# Patient Record
Sex: Female | Born: 1950 | Race: White | Hispanic: No | Marital: Married | Smoking: Current every day smoker
Health system: Southern US, Community
[De-identification: ages and names within clinical notes are randomized; demographics above are authoritative.]

## PROBLEM LIST (undated history)

## (undated) ENCOUNTER — Emergency Department (HOSPITAL_BASED_OUTPATIENT_CLINIC_OR_DEPARTMENT_OTHER): Payer: Medicare Other

## (undated) DIAGNOSIS — R51 Headache: Secondary | ICD-10-CM

## (undated) DIAGNOSIS — Z9889 Other specified postprocedural states: Secondary | ICD-10-CM

## (undated) DIAGNOSIS — I10 Essential (primary) hypertension: Secondary | ICD-10-CM

## (undated) DIAGNOSIS — R112 Nausea with vomiting, unspecified: Secondary | ICD-10-CM

## (undated) DIAGNOSIS — N390 Urinary tract infection, site not specified: Secondary | ICD-10-CM

## (undated) DIAGNOSIS — R519 Headache, unspecified: Secondary | ICD-10-CM

## (undated) DIAGNOSIS — M549 Dorsalgia, unspecified: Secondary | ICD-10-CM

## (undated) DIAGNOSIS — F419 Anxiety disorder, unspecified: Secondary | ICD-10-CM

## (undated) DIAGNOSIS — F329 Major depressive disorder, single episode, unspecified: Secondary | ICD-10-CM

## (undated) DIAGNOSIS — M797 Fibromyalgia: Secondary | ICD-10-CM

## (undated) DIAGNOSIS — I639 Cerebral infarction, unspecified: Secondary | ICD-10-CM

## (undated) DIAGNOSIS — M1990 Unspecified osteoarthritis, unspecified site: Secondary | ICD-10-CM

## (undated) DIAGNOSIS — J449 Chronic obstructive pulmonary disease, unspecified: Secondary | ICD-10-CM

## (undated) DIAGNOSIS — G4730 Sleep apnea, unspecified: Secondary | ICD-10-CM

## (undated) DIAGNOSIS — F32A Depression, unspecified: Secondary | ICD-10-CM

## (undated) DIAGNOSIS — R0600 Dyspnea, unspecified: Secondary | ICD-10-CM

## (undated) HISTORY — PX: HAND SURGERY: SHX662

## (undated) HISTORY — PX: CHOLECYSTECTOMY: SHX55

## (undated) HISTORY — PX: ABDOMINAL HYSTERECTOMY: SHX81

---

## 2000-11-16 ENCOUNTER — Ambulatory Visit (HOSPITAL_COMMUNITY): Admission: RE | Admit: 2000-11-16 | Discharge: 2000-11-16 | Payer: Self-pay | Admitting: Family Medicine

## 2000-11-16 ENCOUNTER — Encounter: Payer: Self-pay | Admitting: Family Medicine

## 2000-12-17 ENCOUNTER — Encounter: Payer: Self-pay | Admitting: Family Medicine

## 2000-12-17 ENCOUNTER — Ambulatory Visit (HOSPITAL_COMMUNITY): Admission: RE | Admit: 2000-12-17 | Discharge: 2000-12-17 | Payer: Self-pay | Admitting: Family Medicine

## 2001-04-19 ENCOUNTER — Encounter: Payer: Self-pay | Admitting: Family Medicine

## 2001-04-19 ENCOUNTER — Ambulatory Visit (HOSPITAL_COMMUNITY): Admission: RE | Admit: 2001-04-19 | Discharge: 2001-04-19 | Payer: Self-pay | Admitting: Family Medicine

## 2001-04-20 ENCOUNTER — Encounter: Payer: Self-pay | Admitting: Family Medicine

## 2001-04-20 ENCOUNTER — Ambulatory Visit (HOSPITAL_COMMUNITY): Admission: RE | Admit: 2001-04-20 | Discharge: 2001-04-20 | Payer: Self-pay | Admitting: Family Medicine

## 2002-09-08 ENCOUNTER — Ambulatory Visit (HOSPITAL_COMMUNITY): Admission: RE | Admit: 2002-09-08 | Discharge: 2002-09-08 | Payer: Self-pay | Admitting: Family Medicine

## 2002-09-08 ENCOUNTER — Encounter: Payer: Self-pay | Admitting: Family Medicine

## 2003-05-22 ENCOUNTER — Encounter: Payer: Self-pay | Admitting: Family Medicine

## 2003-05-22 ENCOUNTER — Ambulatory Visit (HOSPITAL_COMMUNITY): Admission: RE | Admit: 2003-05-22 | Discharge: 2003-05-22 | Payer: Self-pay | Admitting: Family Medicine

## 2003-07-04 ENCOUNTER — Ambulatory Visit (HOSPITAL_COMMUNITY): Admission: RE | Admit: 2003-07-04 | Discharge: 2003-07-04 | Payer: Self-pay | Admitting: Family Medicine

## 2003-08-25 ENCOUNTER — Ambulatory Visit (HOSPITAL_COMMUNITY): Admission: RE | Admit: 2003-08-25 | Discharge: 2003-08-25 | Payer: Self-pay | Admitting: Urology

## 2004-04-10 ENCOUNTER — Ambulatory Visit (HOSPITAL_COMMUNITY): Admission: RE | Admit: 2004-04-10 | Discharge: 2004-04-10 | Payer: Self-pay | Admitting: Orthopedic Surgery

## 2004-05-15 ENCOUNTER — Encounter: Admission: RE | Admit: 2004-05-15 | Discharge: 2004-05-15 | Payer: Self-pay | Admitting: Orthopedic Surgery

## 2004-05-29 ENCOUNTER — Encounter: Admission: RE | Admit: 2004-05-29 | Discharge: 2004-05-29 | Payer: Self-pay | Admitting: Orthopedic Surgery

## 2004-06-12 ENCOUNTER — Encounter: Admission: RE | Admit: 2004-06-12 | Discharge: 2004-06-12 | Payer: Self-pay | Admitting: Orthopedic Surgery

## 2004-08-16 ENCOUNTER — Ambulatory Visit (HOSPITAL_COMMUNITY): Admission: RE | Admit: 2004-08-16 | Discharge: 2004-08-16 | Payer: Self-pay | Admitting: Family Medicine

## 2004-08-22 ENCOUNTER — Ambulatory Visit: Payer: Self-pay | Admitting: Orthopedic Surgery

## 2004-10-21 ENCOUNTER — Ambulatory Visit (HOSPITAL_COMMUNITY): Admission: RE | Admit: 2004-10-21 | Discharge: 2004-10-21 | Payer: Self-pay | Admitting: Neurosurgery

## 2004-11-13 ENCOUNTER — Ambulatory Visit (HOSPITAL_COMMUNITY): Admission: RE | Admit: 2004-11-13 | Discharge: 2004-11-13 | Payer: Self-pay | Admitting: Neurosurgery

## 2007-04-28 ENCOUNTER — Ambulatory Visit (HOSPITAL_COMMUNITY): Admission: RE | Admit: 2007-04-28 | Discharge: 2007-04-28 | Payer: Self-pay | Admitting: Family Medicine

## 2007-05-06 ENCOUNTER — Ambulatory Visit (HOSPITAL_COMMUNITY): Admission: RE | Admit: 2007-05-06 | Discharge: 2007-05-06 | Payer: Self-pay | Admitting: Family Medicine

## 2007-05-12 ENCOUNTER — Ambulatory Visit: Payer: Self-pay | Admitting: Internal Medicine

## 2007-05-18 ENCOUNTER — Emergency Department (HOSPITAL_COMMUNITY): Admission: EM | Admit: 2007-05-18 | Discharge: 2007-05-18 | Payer: Self-pay | Admitting: Emergency Medicine

## 2007-05-18 ENCOUNTER — Ambulatory Visit: Payer: Self-pay

## 2007-06-02 ENCOUNTER — Ambulatory Visit: Payer: Self-pay

## 2007-06-09 ENCOUNTER — Ambulatory Visit: Payer: Self-pay | Admitting: Internal Medicine

## 2007-06-10 ENCOUNTER — Ambulatory Visit: Payer: Self-pay | Admitting: Internal Medicine

## 2007-06-21 ENCOUNTER — Telehealth (INDEPENDENT_AMBULATORY_CARE_PROVIDER_SITE_OTHER): Payer: Self-pay | Admitting: *Deleted

## 2007-06-25 DIAGNOSIS — I10 Essential (primary) hypertension: Secondary | ICD-10-CM | POA: Insufficient documentation

## 2007-08-09 ENCOUNTER — Ambulatory Visit: Payer: Self-pay | Admitting: Internal Medicine

## 2007-08-09 DIAGNOSIS — J449 Chronic obstructive pulmonary disease, unspecified: Secondary | ICD-10-CM | POA: Insufficient documentation

## 2007-08-09 DIAGNOSIS — Z720 Tobacco use: Secondary | ICD-10-CM | POA: Insufficient documentation

## 2007-08-09 DIAGNOSIS — F17200 Nicotine dependence, unspecified, uncomplicated: Secondary | ICD-10-CM | POA: Insufficient documentation

## 2007-08-09 DIAGNOSIS — J4489 Other specified chronic obstructive pulmonary disease: Secondary | ICD-10-CM | POA: Insufficient documentation

## 2007-09-06 ENCOUNTER — Ambulatory Visit: Payer: Self-pay | Admitting: Internal Medicine

## 2007-11-01 ENCOUNTER — Ambulatory Visit: Payer: Self-pay | Admitting: Internal Medicine

## 2007-11-01 LAB — CONVERTED CEMR LAB
BUN: 9 mg/dL (ref 6–23)
Calcium: 9.4 mg/dL (ref 8.4–10.5)
Creatinine, Ser: 1 mg/dL (ref 0.4–1.2)
GFR calc Af Amer: 73 mL/min
GFR calc non Af Amer: 61 mL/min

## 2007-11-10 ENCOUNTER — Ambulatory Visit: Payer: Self-pay | Admitting: Internal Medicine

## 2007-12-08 ENCOUNTER — Telehealth: Payer: Self-pay | Admitting: Internal Medicine

## 2008-08-29 ENCOUNTER — Ambulatory Visit: Payer: Self-pay | Admitting: Internal Medicine

## 2009-01-10 ENCOUNTER — Ambulatory Visit: Payer: Self-pay | Admitting: Cardiology

## 2009-01-16 ENCOUNTER — Ambulatory Visit (HOSPITAL_COMMUNITY): Admission: RE | Admit: 2009-01-16 | Discharge: 2009-01-16 | Payer: Self-pay | Admitting: Family Medicine

## 2009-02-13 ENCOUNTER — Ambulatory Visit: Payer: Self-pay | Admitting: Internal Medicine

## 2009-05-14 ENCOUNTER — Ambulatory Visit: Payer: Self-pay | Admitting: Internal Medicine

## 2009-05-22 ENCOUNTER — Ambulatory Visit (HOSPITAL_COMMUNITY): Admission: RE | Admit: 2009-05-22 | Discharge: 2009-05-22 | Payer: Self-pay | Admitting: Internal Medicine

## 2009-08-21 ENCOUNTER — Ambulatory Visit (HOSPITAL_COMMUNITY): Admission: RE | Admit: 2009-08-21 | Discharge: 2009-08-21 | Payer: Self-pay | Admitting: Obstetrics and Gynecology

## 2010-02-04 ENCOUNTER — Ambulatory Visit: Payer: Self-pay | Admitting: Internal Medicine

## 2010-02-11 ENCOUNTER — Encounter: Payer: Self-pay | Admitting: Internal Medicine

## 2010-02-15 ENCOUNTER — Telehealth: Payer: Self-pay | Admitting: Internal Medicine

## 2010-06-04 ENCOUNTER — Telehealth (INDEPENDENT_AMBULATORY_CARE_PROVIDER_SITE_OTHER): Payer: Self-pay | Admitting: *Deleted

## 2010-08-29 NOTE — Progress Notes (Signed)
Summary: needs spiro, research study-[id number]  Phone Note Outgoing Call   Summary of Call: called patient to see if she is interesed in [facility] on COPD patient. LMTCB. If she is, needs spirometry repeated by Jerolyn Shin Initial call taken by: Kalman Shan MD,  June 04, 2010 2:36 PM

## 2010-08-29 NOTE — Assessment & Plan Note (Signed)
Summary: rov ///kp   Visit Type:  Follow-up Primary Provider/Referring Provider:  Dr Regino Schultze in [place], [place]  CC:  Pt here for follow up. Pt states is smoking a little less than a ppd.  History of Present Illness: OV 02/04/2010. Last visit was 05/14/2009. Followup tobacco abuse, and Gold Stage 2 copd. IIn terms of smoking, she continues to smoke. She states she is stil smoking 1/2 pack a day. Unable to quit further. Feels she is unable to and not ready. Has not read [employer] book since last visit. She read it initially but then gave up. REfusing chantix and wellbutrin and patch; has tried al in past per hx and does not want to try again. Husband has not been able to quit either. She understands the need to quit. In terms of copd, she continues with spiriva. Cough and mild dyspnea are stable. Essentially coughs early in the morning with some mild white mucus. Dyspnea is veyr mild on severe exertion only. No new complaints.    Preventive Screening-Counseling & Management  Alcohol-Tobacco     Smoking Status: current     Smoking Cessation Counseling: yes     Smoke Cessation Stage: contemplative     Packs/Day: 0.75     Year Started: 1992      Passive Smoke Exposure: yes     Tobacco Counseling: to quit use of tobacco products     Passive Smoke Counseling: to avoid passive smoke exposure  Comments: husband also smokes. Both discussing quitting. Has failed zyban and chantix in past. Not ready to quit as of 02/04/2010  Current Medications (verified): 1)  Spiriva Handihaler 18 Mcg  Caps (Tiotropium Bromide Monohydrate) .... Inhale Contents of 1 Capsule Once A Day 2)  Proair Hfa 108 (90 Base) Mcg/act Aers (Albuterol Sulfate) .... As Needed 3)  Bisoprolol-Hydrochlorothiazide 10-6.25 Mg Tabs (Bisoprolol-Hydrochlorothiazide) .... Take 1 Tablet By Mouth Once A Day 4)  Simvastatin 20 Mg Tabs (Simvastatin) .... Take 1 Tablet By Mouth Once A Day 5)  Furosemide 20 Mg Tabs (Furosemide) .... Take 1  Tablet By Mouth Once A Day 6)  Folic Acid 1 Mg Tabs (Folic Acid) .... Take 1 Tablet By Mouth Once A Day 7)  Gabapentin 300 Mg Caps (Gabapentin) .... Take 1 Tablet By Mouth Three Times A Day 8)  Lidoderm 5 % Ptch (Lidocaine) .... As Needed 9)  Oxycodone-Acetaminophen 5-500 Mg Caps (Oxycodone-Acetaminophen) .... As Needed For Pain  Allergies (verified): 1)  ! Codeine Sulfate (Codeine Sulfate) 2)  ! Prednisone  Past History:  Family History: Last updated: 08/09/2007 Her sister has allergies.  Mother has asthma.   Significant members of the family have heart disease.  Mom died from an   MI.  Father had cirrhosis of the liver.  One sister died from an MI,   another brother died MI, both while they were young.  Two living sisters   and two brothers both with heart attack and diabetes  Social History: Last updated: 02/04/2010 Smokes 1 pack a day for 16 years.  She is married, has   children, lives with her husband.  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  She   used to make blankets.  She used to be exposed to cotton dust.   Patient is a current smoker.   Risk Factors: Smoking Status: current (02/04/2010) Packs/Day: 0.75 (02/04/2010) Passive Smoke Exposure: yes (02/04/2010)  Past Medical  History: Reviewed history from 02/13/2009 and no changes required. 1. Hypertension.   2. Bladder problems.   3. History of asthma.   4. History of chronic bronchitis.  She states she has been on an      inhaler for 6-7 years.  She has been told by her M.D. several      times, especially in the last 2-3 years, that she has recurrent      flares of acute bronchitis.   5. Low backache, not otherwise specified.  She has never had a stress       test.  6. COPD - Gold Stage II diagnosed Oct 2008 7. NODULES CT 04/2007 -> 10/2007: No change. Shows micronodules RUL and LUL (LUL likely mucoid impaction) Followup CT June 2010 - no change. No  firhter followup  Past Surgical History: Reviewed history from 08/09/2007 and no changes required. 1. Status post cholecystectomy 15 years ago.   2. Status post hysterectomy 10 years ago.  Family History: Reviewed history from 08/09/2007 and no changes required. Her sister has allergies.  Mother has asthma.   Significant members of the family have heart disease.  Mom died from an   MI.  Father had cirrhosis of the liver.  One sister died from an MI,   another brother died MI, both while they were young.  Two living sisters   and two brothers both with heart attack and diabetes  Social History: Reviewed history from 08/09/2007 and no changes required. Smokes 1 pack a day for 16 years.  She is married, has   children, lives with her husband.  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  She   used to make blankets.  She used to be exposed to cotton dust.   Patient is a current smoker.  Packs/Day:  0.75 Passive Smoke Exposure:  yes  Review of Systems      See HPI       The patient complains of dyspnea on exertion.  The patient denies anorexia, fever, weight loss, weight gain, vision loss, decreased hearing, hoarseness, chest pain, syncope, peripheral edema, prolonged cough, headaches, hemoptysis, abdominal pain, melena, hematochezia, severe indigestion/heartburn, hematuria, incontinence, genital sores, muscle weakness, suspicious skin lesions, transient blindness, difficulty walking, depression, unusual weight change, abnormal bleeding, enlarged lymph nodes, angioedema, breast masses, and testicular masses.         mild  Vital Signs:  Patient profile:   60 year old female Height:      64 inches Weight:      150.8 pounds BMI:     25.98 O2 Sat:      95 % on Room air Temp:     98.2 degrees F oral Pulse rate:   78 / minute BP sitting:   130 / 86  (left arm) Cuff size:   regular  Vitals Entered By: Zackery Barefoot CMA  (February 04, 2010 1:52 PM)  O2 Flow:  Room air CC: Pt here for follow up. Pt states is smoking a little less than a ppd Comments Medications reviewed with patient Verified contact number and pharmacy with patient Zackery Barefoot CMA  February 04, 2010 1:52 PM    Physical Exam  General:  normal appearance.   Head:  normocephalic and atraumatic Eyes:  PERRLA/EOM intact; conjunctiva and sclera clear Mouth:  no deformity or lesions Neck:  no masses, thyromegaly, or abnormal cervical nodes Chest  Wall:  no deformities noted Lungs:  clear bilaterally to auscultation and percussion Heart:  regular rate and rhythm, S1, S2 without murmurs, rubs, gallops, or clicks Abdomen:  bowel sounds positive; abdomen soft and non-tender without masses, or organomegaly Msk:  no deformity or scoliosis noted with normal posture Pulses:  pulses normal Extremities:  no clubbing, cyanosis, edema, or deformity noted Neurologic:  axox3. 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 Size 4mm or less. Some mucoid impaction also seen in LL airway on CT 10/2007  plan no further followup  Orders: Radiology Referral (Radiology)  Problem # 2:  TOBACCO ABUSE (ICD-305.1) Assessment: Unchanged We spent 5 minutes dicussing quit smoking edfforts. She declined therapeutic intervention and attending classes. She states she will try on own. Not fully ready to quit Orders: Prescription Created Electronically [phone]) Est. Patient Level III (60454) Tobacco use cessation intermediate 3-10 minutes (09811)  Problem # 3:  C O P D (ICD-496) Assessment: Unchanged  stable Gold stage 2. Mild  symptoms only  plan continue spiriva samples given for spiriva HFA Technique re-emphasized Check A1AT deficiency - send out out to [facility] [place]  Medications Added to Medication List This Visit: 1)  Proair Hfa 108 (90 Base) Mcg/act Aers (Albuterol sulfate) .... As needed 2)  Gabapentin 300 Mg Caps (Gabapentin) .... Take 1 tablet by mouth three times a day 3)  Lidoderm 5 % Ptch (Lidocaine) .... As needed 4)  Oxycodone-acetaminophen 5-500 Mg Caps (Oxycodone-acetaminophen) .... As needed for pain  Other Orders: HFA Instruction [phone]) T- * Misc. Laboratory test [phone])  Patient Instructions: 1)  glad your copd is stable 2)  please work on quitting smoking 3)  Take 2 spiriva samples 4)  Take discoung card for spiriva 5)  Show your spiriva technique to my nurse 6)  Give your blood for genetic cause of COPD - alpha 1 antitrypsin deficiency.- this will be sent to [facility]. I will call you with results 7)  Return in 1 year or sooner if there are problems

## 2010-08-29 NOTE — Progress Notes (Signed)
Summary: alpha 1 antirypsin leve  Phone Note Outgoing Call   Summary of Call: pls call patient and state that Alpha 1 Antitrypsin was normal. No genetic cause for COPD Initial call taken by: Kalman Shan MD,  February 15, 2010 5:44 PM  Follow-up for Phone Call        [facility] x1 Carver Fila  February 18, 2010 1:06 PM   Additional Follow-up for Phone Call Additional follow up Details #1::        keep trying. If it goes to voice mail. just state the result and be done with it Additional Follow-up by: Kalman Shan MD,  February 18, 2010 3:21 PM    Additional Follow-up for Phone Call Additional follow up Details #2::    [facility] Gweneth Dimitri RN  February 18, 2010 3:33 PM  pt returned call.  advised of lab results as stated by MR above.  pt verbalized her understanding. Boone Master CNA/MA  February 18, 2010 4:13 PM

## 2010-09-17 ENCOUNTER — Other Ambulatory Visit (HOSPITAL_COMMUNITY): Payer: Self-pay | Admitting: Family Medicine

## 2010-09-17 DIAGNOSIS — M5410 Radiculopathy, site unspecified: Secondary | ICD-10-CM

## 2010-09-17 DIAGNOSIS — M545 Low back pain, unspecified: Secondary | ICD-10-CM

## 2010-09-19 ENCOUNTER — Ambulatory Visit (HOSPITAL_COMMUNITY)
Admission: RE | Admit: 2010-09-19 | Discharge: 2010-09-19 | Disposition: A | Discharge status: Home / Self Care | Payer: PRIVATE HEALTH INSURANCE | Source: Ambulatory Visit | Attending: Family Medicine | Admitting: Family Medicine

## 2010-09-19 DIAGNOSIS — M5410 Radiculopathy, site unspecified: Secondary | ICD-10-CM

## 2010-09-19 DIAGNOSIS — M545 Low back pain, unspecified: Secondary | ICD-10-CM | POA: Insufficient documentation

## 2010-09-19 DIAGNOSIS — M5137 Other intervertebral disc degeneration, lumbosacral region: Secondary | ICD-10-CM | POA: Insufficient documentation

## 2010-09-19 DIAGNOSIS — M519 Unspecified thoracic, thoracolumbar and lumbosacral intervertebral disc disorder: Secondary | ICD-10-CM | POA: Insufficient documentation

## 2010-09-19 DIAGNOSIS — M51379 Other intervertebral disc degeneration, lumbosacral region without mention of lumbar back pain or lower extremity pain: Secondary | ICD-10-CM | POA: Insufficient documentation

## 2010-09-19 DIAGNOSIS — M48061 Spinal stenosis, lumbar region without neurogenic claudication: Secondary | ICD-10-CM | POA: Insufficient documentation

## 2010-12-05 ENCOUNTER — Other Ambulatory Visit: Payer: Self-pay | Admitting: *Deleted

## 2010-12-05 MED ORDER — TIOTROPIUM BROMIDE MONOHYDRATE 18 MCG IN CAPS: 18.0000 ug | ORAL_CAPSULE | Freq: Every day | RESPIRATORY_TRACT | Status: DC

## 2010-12-05 NOTE — Telephone Encounter (Signed)
Spiriva was called into  Pharm for # 30 with 3 refills.  Pt has appt on July 1.  This was done only once -- but was having issues signing the previous refill encounter.

## 2010-12-10 NOTE — Assessment & Plan Note (Signed)
[facility]                             PULMONARY OFFICE NOTE   Brandi, Bean                         MRN:          [id number]  DATE:06/10/2007                            DOB:          [birth year]    CHIEF COMPLAINT:  Follow up COPD, pulmonary nodule, atypical chest pain,  smoking.   HISTORY OF PRESENT ILLNESS:  Brandi Bean returns for follow up.  Last  visit was May 12, 2007, for multiple issues discussed below.  In the  interim, she had an ER visit on May 18, 2007, with palpitations, was  found to be in sinus tachycardia.  Troponin was negative.  Was started  on Toprol.  Since then, she has been okay.  Symptoms of shortness of  breath, cough and atypical chest pain still persist, no change.  Underwent pft and dobutamine stress test (results below) in interim.   Past medical history, past surgical history, allergies, social history,  exposure history, family history, review of systems, all similar to  May 12, 2007, and unchanged.   CURRENT MEDICATIONS:  1. Aspirin 81 mg once daily  2. Toprol once daily for sinus tachycardia.   PHYSICAL EXAMINATION:  VITAL SIGNS:  Temperature today is 98.3, blood  pressure 118/78, pulse 84, saturation 98% on room air, weight 138  pounds.  GENERAL:  Physical examination essentially unchanged from prior time.  General shows an anxious pleasant female seated comfortably.  CNS:  Alert and oriented x3, speech and ambulation normal.  CARDIOVASCULAR:  Normal heart sounds.  RESPIRATORY:  Bilateral expiratory wheeze present, but not in obvious  respiratory distress.  ABDOMEN:  Soft, nontender, central incision scar present.  EXTREMITIES:  No cyanosis, clubbing or edema.  SKIN:  Skin is intact.   INTERIM LABORATORY EVALUATION:  1. Pulmonary function tests on June 09, 2007, shows gold stage II      COPD.  FEV1 1.5 liters, 68%, SVC 2.4 liters, 79%, FE1:FVC ratio      reduced to 64.  There was 11%  bronchodilator response that      corresponded to 170 cc in FEV1.  Total lung capacity was 4.7 liters      100%, residue volume was 2.3 liters, 135% consistent with air      trapping.  Adjusted DLCO was 17.1 ml/mmHg per minute at 89%.   1. June 02, 2007, underwent dobutamine stress test.  At rest,      Cardiolite images showed mild decreased uptake in the distal      anterior wall.  This was also persistent in the stress Cardiolite      images.  With dobutamine, LV contractility was normal.  Ejection      fraction 63%.  However, on EKG there was a 1 mm ST depression      consistent with ischemia in the inferior leads.   ASSESSMENT/PLAN:  1. Symptoms of cough, shortness of breath.  These are consistent with      gold stage II chronic obstructive pulmonary disease.  and chronic      bronchitis.  DLCO is normal.  She  has already had a flu shot.      Start Spiriva one puff daily, maintenance inhaler with Albuterol      p.r.n.  At follow up, will recheck spirometry and then decide on      long-acting beta agonists and steroids.  Will need pulmonary      rehabilitation, but currently, she is overwhelmed with the      diagnosis, and therefore, we will defer this.   1. Tobacco abuse.  She wants to quit.  She understands this is the      etiology for COPD.  She was asking about a patch, but after my      detailed discussion with her, she is more interested in Chantix.      However, she is still upset at the diagnosis of COPD, and will      defer getting her into an active program for quitting smoking to a      later visit.   1. Atypical chest pain.  Possible abnormal Cardiac stress test.  Will      refer to [facility] for further evaluation.   1. Health Maintenance.  Pneumovax administered.  She already had flu      shot.   1. Nodule.  CT scan of the chest is pending in 8-9 months time,      according to Fleischner criteria.   1. Return to clinic in one month to report  improvement with Spiriva.     Kalman Shan, MD  Electronically Signed    MR/MedQ  DD: 06/10/2007  DT: 06/11/2007  Job #: [id number]

## 2010-12-10 NOTE — Assessment & Plan Note (Signed)
Brandi Bean                             PULMONARY OFFICE NOTE   Brandi Bean                         MRN:          [id number]  DATE:05/12/2007                            DOB:          19-Jan-1951    CHIEF COMPLAINT:  Referral for pulmonary nodule.   HISTORY OF PRESENT ILLNESS:  Brandi Bean is a pleasant 60 year old  woman who states that she had a touch of pneumonia 2 weeks prior to  this clinic visit.  She states that she is largely better from it.  Green sputum has now become clear but she still has some sinusitis.  She  describes the onset of symptoms as feeling weak, having a cough, and  shortness of breath but no fever and sputum changing to green color.  She then had a chest x-ray on October 6th, which showed a knot and  then followed up with a CT scan on May 06, 2007, and this was done 2  weeks after an antibiotic course that ended on May 07, 2007.  Because of this knot on her lung, she has been referred here.   PAST MEDICAL HISTORY:  1. Hypertension.  2. Bladder problems.  3. History of asthma.  4. History of chronic bronchitis.  She states she has been on an      inhaler for 6-7 years.  She has been told by her M.D. several      times, especially in the last 2-3 years, that she has recurrent      flares of acute bronchitis.  5. Low backache, not otherwise specified.  She has never had a stress      test.   PAST SURGICAL HISTORY:  1. Status post cholecystectomy 15 years ago.  2. Status post hysterectomy 10 years ago.   ALLERGIES:  1. CODEINE, makes her really itch.  2. PREDNISONE, makes her really sick and it makes her have acid reflux      disease.   CURRENT MEDICATIONS:  1. Uretic.  2. Aspirin 81 mg once daily.   SOCIAL HISTORY:  Smokes 1 pack a day for 16 years.  She is married, has  children, lives with her husband.  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 but has not in the last 10  years.  She  used to make blankets.  She used to be exposed to cotton dust.   EXPOSURE HISTORY:  Possible exposure to cotton dust while working in a  [employer].   FAMILY HISTORY:  Her sister has allergies.  Mother has asthma.  Significant members of the family have heart disease.  Mom died from an  MI.  Father had cirrhosis of the liver.  One sister died from an MI,  another brother died MI, both while they were young.  Two living sisters  and two brothers both with heart attack and diabetes.   REVIEW OF SYSTEMS:  She has dyspnea on exertion after a long walk  which is approximately 1/2 a mile.  She also has productive cough with  early morning green sputum but only very little.  Otherwise it is  positive for some urinary incontinence with coughing and nonspecific  chest pain that is currently present.   PHYSICAL EXAMINATION:  VITAL SIGNS:  Weight 139.2 pounds, temperature  97.8, blood pressure 128/88, pulse 116, saturation 94% on room air.  GENERAL:  Shows a pleasant woman sitting comfortably in the exam room.  CENTRAL NERVOUS SYSTEM:  Alert and oriented x3.  Speech and ambulation  normal.  CARDIOVASCULAR:  Normal heart sounds.  RESPIRATORY:  Bilateral expiratory wheeze present but not in obvious  respiratory distress.  ABDOMEN:  Soft, nontender.  Abdomen has a central incision scar.  EXTREMITIES:  No cyanosis.  No clubbing.  No edema.  SKIN:  Intact.   LABORATORY:  CT scan on May 06, 2007, done at [facility] and  read by Dr. Ulyses Southward at [facility] shows:  1. A nonspecific 4-mm right upper lobe nodule on image #15.  2. Normal size precarinal lymph node 16 x 8-mm on image #22.  3. Additional mid right hilar node 1.7 x 1.3-cm, image #25.  4. Low attenuation left adrenal nodule 12 x 7-mm likely adenoma on      image #54.  5. Minimal atelectasis/infiltrate medial aspect of the left lower lobe      with associated peribronchial thickening.  In summary, CT  scan shows some normal size mediastinal hilar lymph nodes  and a 4-mm right upper lobe solitary pulmonary nodule in a patient with  high risk factors of smoking.   PROBLEM LIST:  1. Right upper lobe, 4-mm, solitary, pulmonary nodule in a patient who      is a smoker.  In accordance with the Fleischner  criteria, we will      repeat a CT scan in 9 months.  Radiologist's impression is also to      look at the pretracheal nodes at the time of the followup CT scan.   1. History of smoking and chronic bronchitis with wheezing.  She might      have chronic obstructive pulmonary disease with chronic bronchitis      and emphysema.  We will do a pulmonary function test.   1. Smoking.  I have told her that she needs to quit.   1. Atypical chest pain.  Schedule a cardiac stress test on May 17, 2007.  Pulmonary function test is scheduled on May 26, 2007.   Return to clinic after PFTs and dobutamine stress thallium.     Kalman Shan, MD  Electronically Signed    MR/MedQ  DD: 05/17/2007  DT: 05/17/2007  Job #: [id number]

## 2011-02-08 ENCOUNTER — Emergency Department (HOSPITAL_COMMUNITY)
Admission: EM | Admit: 2011-02-08 | Discharge: 2011-02-08 | Disposition: A | Discharge status: Home / Self Care | Payer: BC Managed Care – PPO | Attending: Emergency Medicine | Admitting: Emergency Medicine

## 2011-02-08 DIAGNOSIS — R350 Frequency of micturition: Secondary | ICD-10-CM | POA: Insufficient documentation

## 2011-02-08 DIAGNOSIS — F17200 Nicotine dependence, unspecified, uncomplicated: Secondary | ICD-10-CM | POA: Insufficient documentation

## 2011-02-08 DIAGNOSIS — N390 Urinary tract infection, site not specified: Secondary | ICD-10-CM

## 2011-02-08 HISTORY — DX: Fibromyalgia: M79.7

## 2011-02-08 HISTORY — DX: Dorsalgia, unspecified: M54.9

## 2011-02-08 HISTORY — DX: Chronic obstructive pulmonary disease, unspecified: J44.9

## 2011-02-08 LAB — URINALYSIS, ROUTINE W REFLEX MICROSCOPIC
Bilirubin Urine: NEGATIVE
Ketones, ur: NEGATIVE mg/dL
Nitrite: NEGATIVE
Protein, ur: NEGATIVE mg/dL
Urobilinogen, UA: 0.2 mg/dL (ref 0.0–1.0)

## 2011-02-08 LAB — URINE MICROSCOPIC-ADD ON

## 2011-02-08 MED ORDER — PHENAZOPYRIDINE HCL 95 MG PO TABS: ORAL_TABLET | ORAL | Status: DC

## 2011-02-08 MED ORDER — PHENAZOPYRIDINE HCL 95 MG PO TABS: 95.0000 mg | ORAL_TABLET | Freq: Three times a day (TID) | ORAL | Status: DC | PRN

## 2011-02-08 MED ORDER — NITROFURANTOIN MONOHYD MACRO 100 MG PO CAPS: ORAL_CAPSULE | ORAL | Status: DC

## 2011-02-08 NOTE — ED Notes (Signed)
Pt presents with UTI sx. Pt states she is experiencing increased frequency and urgency. Symptoms started today.

## 2011-02-08 NOTE — ED Provider Notes (Signed)
History     Chief Complaint  Patient presents with  . Urinary Frequency   Patient is a 60 y.o. female presenting with frequency. The history is provided by the patient.  Urinary Frequency The current episode started today. The problem has been unchanged. Associated symptoms include urinary symptoms. Pertinent negatives include no abdominal pain, arthralgias, chest pain, chills, coughing or neck pain. She has tried nothing for the symptoms. The treatment provided no relief.    Past Medical History  Diagnosis Date  . Back pain   . COPD (chronic obstructive pulmonary disease)   . Fibromyalgia     History reviewed. No pertinent past surgical history.  History reviewed. No pertinent family history.  History  Substance Use Topics  . Smoking status: Current Everyday Smoker -- 1.0 packs/day    Types: Cigarettes  . Smokeless tobacco: Not on file  . Alcohol Use: No    OB History    Grav Para Term Preterm Abortions TAB SAB Ect Mult Living                  Review of Systems  Constitutional: Negative for chills and activity change.       All ROS Neg except as noted in HPI  HENT: Negative for nosebleeds and neck pain.   Eyes: Negative for photophobia and discharge.  Respiratory: Negative for cough, shortness of breath and wheezing.   Cardiovascular: Negative for chest pain and palpitations.  Gastrointestinal: Negative for abdominal pain and blood in stool.  Genitourinary: Positive for frequency. Negative for dysuria and hematuria.  Musculoskeletal: Negative for back pain and arthralgias.  Skin: Negative.   Neurological: Negative for dizziness, seizures and speech difficulty.  Psychiatric/Behavioral: Negative for hallucinations and confusion.    Physical Exam  BP 144/94  Pulse 115  Temp(Src) 98.2 F (36.8 C) (Oral)  Resp 18  Ht 5\' 5"  (1.651 m)  Wt 145 lb (65.772 kg)  BMI 24.13 kg/m2  SpO2 98%  Physical Exam  Nursing note and vitals reviewed. Constitutional: She is  oriented to person, place, and time. She appears well-developed and well-nourished.  Non-toxic appearance.  HENT:  Head: Normocephalic.  Right Ear: Tympanic membrane and external ear normal.  Left Ear: Tympanic membrane and external ear normal.  Eyes: EOM and lids are normal. Pupils are equal, round, and reactive to light.  Neck: Normal range of motion. Neck supple. Carotid bruit is not present.  Cardiovascular: Normal rate, regular rhythm, normal heart sounds, intact distal pulses and normal pulses.   Pulmonary/Chest: Breath sounds normal. No respiratory distress.  Abdominal: Soft. Bowel sounds are normal. There is no tenderness. There is no guarding.       Suprapubic tenderness noted.   Genitourinary:       Mild CVA tenderness noted. Rt > than lt.  Musculoskeletal: Normal range of motion.  Lymphadenopathy:       Head (right side): No submandibular adenopathy present.       Head (left side): No submandibular adenopathy present.    She has no cervical adenopathy.  Neurological: She is alert and oriented to person, place, and time. She has normal strength. No cranial nerve deficit or sensory deficit.  Skin: Skin is warm and dry.  Psychiatric: She has a normal mood and affect. Her speech is normal.    ED Course  Procedures  MDM I have reviewed nursing notes, vital signs, and all appropriate lab and imaging results for this patient.      Kathie Dike, [place] 02/08/11  1351 

## 2011-02-15 NOTE — ED Provider Notes (Signed)
Medical screening examination/treatment/procedure(s) were performed by non-physician practitioner and as supervising physician I was immediately available for consultation/collaboration. Nathaneal Sommers, MD, FACEP   Scotti Motter L Davonta Stroot, MD 02/15/11 0001 

## 2011-03-27 ENCOUNTER — Other Ambulatory Visit: Payer: Self-pay | Admitting: Internal Medicine

## 2011-03-27 MED ORDER — TIOTROPIUM BROMIDE MONOHYDRATE 18 MCG IN CAPS: 18.0000 ug | ORAL_CAPSULE | Freq: Every day | RESPIRATORY_TRACT | Status: DC

## 2011-04-01 ENCOUNTER — Telehealth: Payer: Self-pay | Admitting: Internal Medicine

## 2011-04-01 MED ORDER — TIOTROPIUM BROMIDE MONOHYDRATE 18 MCG IN CAPS: 18.0000 ug | ORAL_CAPSULE | Freq: Every day | RESPIRATORY_TRACT | Status: DC

## 2011-04-01 NOTE — Telephone Encounter (Signed)
Looks like spiriva rx was called into [facility] on 03/27/11.  Called [facility], spoke with Pam who states they did not receive this rx.  I gave verbal for spiriva qd #30 x 0 as pt was last seen by MR on 02/04/2010 but does have a pending OV with him on 04/07/2011.  Pam verbalized understanding of rx.  Called, spoke with pt.  She is aware rx called to [facility] but she will need to keep OV with MR on 04/07/2011.  She verbalized understanding of this and ok with it.

## 2011-04-07 ENCOUNTER — Ambulatory Visit (INDEPENDENT_AMBULATORY_CARE_PROVIDER_SITE_OTHER): Payer: BC Managed Care – PPO | Admitting: Internal Medicine

## 2011-04-07 ENCOUNTER — Encounter: Payer: Self-pay | Admitting: Internal Medicine

## 2011-04-07 VITALS — BP 95/52 | HR 94 | Temp 97.6°F | Ht 65.0 in | Wt 151.2 lb

## 2011-04-07 DIAGNOSIS — J4489 Other specified chronic obstructive pulmonary disease: Secondary | ICD-10-CM

## 2011-04-07 DIAGNOSIS — Z23 Encounter for immunization: Secondary | ICD-10-CM

## 2011-04-07 DIAGNOSIS — F17200 Nicotine dependence, unspecified, uncomplicated: Secondary | ICD-10-CM

## 2011-04-07 DIAGNOSIS — J449 Chronic obstructive pulmonary disease, unspecified: Secondary | ICD-10-CM

## 2011-04-07 MED ORDER — TIOTROPIUM BROMIDE MONOHYDRATE 18 MCG IN CAPS: 18.0000 ug | ORAL_CAPSULE | Freq: Every day | RESPIRATORY_TRACT | Status: DC

## 2011-04-07 NOTE — Assessment & Plan Note (Signed)
Stable gold stage 2 COPD  - MM genotype  Plan Flu shot Continue spiriva  Refills done ROV 1 year Will do spiro at followup

## 2011-04-07 NOTE — Progress Notes (Signed)
  Subjective:    Patient ID: Brandi Bean, female    DOB: 1950-12-09, 60 y.o.   MRN: [id number]  HPI Problem list 1. Tobacco abuse: refused chantix and wellburin and patch - summer 2011 due to prior failure with same 2. 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 Pulmonary micronodules - stable Oct 2008, April 2009 and June 2010 with no further followup planned  OV 04/04/2011: Followup for above. Last visit was in July 2011. COPD stabel. Dyspnea is class 1-2. No cough. Compliant with spiriva. No hx of AECOPD or admissions. Still smoking. Unable to quit. Recent stressors: mentally handicapped brother in and out of hospital.   Past med hx: no change Social: went on cruise Fam hx: Brother in and out of hospital   Review of Systems  Constitutional: Negative for fever and unexpected weight change.  HENT: Positive for ear pain and congestion. Negative for nosebleeds, sore throat, rhinorrhea, sneezing, trouble swallowing, dental problem, postnasal drip and sinus pressure.   Eyes: Negative for redness and itching.  Respiratory: Positive for cough and wheezing. Negative for chest tightness and shortness of breath.   Cardiovascular: Positive for leg swelling. Negative for palpitations.       Feet  Gastrointestinal: Negative for nausea and vomiting.  Genitourinary: Positive for dysuria.  Musculoskeletal: Negative for joint swelling.  Skin: Negative for rash.  Neurological: Negative for headaches.  Hematological: Does not bruise/bleed easily.  Psychiatric/Behavioral: Negative for dysphoric mood. The patient is nervous/anxious.        Objective:   Physical Exam  Vitals reviewed. Constitutional: She is oriented to person, place, and time. She appears well-developed and well-nourished. No distress.  HENT:  Head: Normocephalic and atraumatic.  Right Ear: External ear normal.  Left Ear: External ear normal.  Mouth/Throat: Oropharynx is clear and moist. No oropharyngeal exudate.    Eyes: Conjunctivae and EOM are normal. Pupils are equal, round, and reactive to light. Right eye exhibits no discharge. Left eye exhibits no discharge. No scleral icterus.  Neck: Normal range of motion. Neck supple. No JVD present. No tracheal deviation present. No thyromegaly present.  Cardiovascular: Normal rate, regular rhythm, normal heart sounds and intact distal pulses.  Exam reveals no gallop and no friction rub.   No murmur heard. Pulmonary/Chest: Effort normal and breath sounds normal. No respiratory distress. She has no wheezes. She has no rales. She exhibits no tenderness.  Abdominal: Soft. Bowel sounds are normal. She exhibits no distension and no mass. There is no tenderness. There is no rebound and no guarding.  Musculoskeletal: Normal range of motion. She exhibits no edema and no tenderness.  Lymphadenopathy:    She has no cervical adenopathy.  Neurological: She is alert and oriented to person, place, and time. She has normal reflexes. No cranial nerve deficit. She exhibits normal muscle tone. Coordination normal.  Skin: Skin is warm and dry. No rash noted. She is not diaphoretic. No erythema. No pallor.  Psychiatric: She has a normal mood and affect. Her behavior is normal. Judgment and thought content normal.          Assessment & Plan:

## 2011-04-07 NOTE — Assessment & Plan Note (Signed)
3 min counseling done. She does not want to quit. She knows importance of quitting

## 2011-04-07 NOTE — Patient Instructions (Signed)
Please take spiriva sample and 1 year refill Please work on quitting smoking as much as you can Have flu shot today REturn in 1 year or sooner if having cold or worsening of any symptoms

## 2011-05-06 ENCOUNTER — Other Ambulatory Visit: Payer: Self-pay | Admitting: Pulmonary Disease

## 2011-05-06 MED ORDER — TIOTROPIUM BROMIDE MONOHYDRATE 18 MCG IN CAPS: 18.0000 ug | ORAL_CAPSULE | Freq: Every day | RESPIRATORY_TRACT | Status: DC

## 2011-05-07 LAB — CBC
HCT: 43.6
Hemoglobin: 15.3 — ABNORMAL HIGH
Platelets: 242
RBC: 4.88
WBC: 9.3

## 2011-05-07 LAB — BASIC METABOLIC PANEL
BUN: 8
GFR calc Af Amer: >60
GFR calc non Af Amer: >60
Potassium: 3.2 — ABNORMAL LOW
Sodium: 142

## 2011-05-07 LAB — POCT CARDIAC MARKERS
CKMB, poc: <1 — ABNORMAL LOW
Troponin i, poc: <0.05

## 2011-12-23 ENCOUNTER — Other Ambulatory Visit: Payer: Self-pay | Admitting: Internal Medicine

## 2012-03-19 ENCOUNTER — Encounter: Payer: Self-pay | Admitting: Internal Medicine

## 2012-03-19 ENCOUNTER — Ambulatory Visit (INDEPENDENT_AMBULATORY_CARE_PROVIDER_SITE_OTHER): Payer: BC Managed Care – PPO | Admitting: Internal Medicine

## 2012-03-19 VITALS — BP 136/84 | HR 91 | Temp 97.6°F | Ht 65.0 in | Wt 154.0 lb

## 2012-03-19 DIAGNOSIS — J4489 Other specified chronic obstructive pulmonary disease: Secondary | ICD-10-CM

## 2012-03-19 DIAGNOSIS — J441 Chronic obstructive pulmonary disease with (acute) exacerbation: Secondary | ICD-10-CM

## 2012-03-19 DIAGNOSIS — J449 Chronic obstructive pulmonary disease, unspecified: Secondary | ICD-10-CM

## 2012-03-19 DIAGNOSIS — F17200 Nicotine dependence, unspecified, uncomplicated: Secondary | ICD-10-CM

## 2012-03-19 MED ORDER — PREDNISONE 10 MG PO TABS: ORAL_TABLET | ORAL | Status: DC

## 2012-03-19 NOTE — Assessment & Plan Note (Signed)
Understands need to quit smoking but unable to and refuses to quit smoking. 5 min spent counseling.

## 2012-03-19 NOTE — Patient Instructions (Addendum)
#  COPD You are likely havinbg a real mild copd flare due to your recent sinus infection We will hold off antibiotics because you just finished a course and you are not interested in another course However, Take prednisone 40 mg daily x 2 days, then 20mg  daily x 2 days, then 10mg  daily x 2 days, then 5mg  daily x 2 days and stop Also in few weeks have full PFT breathing test at [facility] -and have them fax results to me - this is for annual review of lung function Continue spiriva Have flu shot in fall Return back in 1 year  #smoking Quit smoking   #Followup  1 year

## 2012-03-19 NOTE — Assessment & Plan Note (Signed)
#  COPD You are likely havinbg a real mild copd flare due to your recent sinus infection We will hold off antibiotics because you just finished a course and you are not interested in another course However, Take prednisone 40 mg daily x 2 days, then 20mg  daily x 2 days, then 10mg  daily x 2 days, then 5mg  daily x 2 days and stop  #Followup  1 year

## 2012-03-19 NOTE — Progress Notes (Signed)
Subjective:    Patient ID: Brandi Bean, female    DOB: Jul 10, 1951, 61 y.o.   MRN: [id number]  HPI Problem list 1. Tobacco abuse: refused chantix and wellburin and patch - summer 2011 due to prior failure with same 2. 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 Pulmonary micronodules - stable Oct 2008, April 2009 and June 2010 with no further followup planned  OV 04/04/2011: Followup for above. Last visit was in July 2011. COPD stabel. Dyspnea is class 1-2. No cough. Compliant with spiriva. No hx of AECOPD or admissions. Still smoking. Unable to quit. Recent stressors: mentally handicapped brother in and out of hospital.   REC Please take spiriva sample and 1 year refill  Please work on quitting smoking as much as you can  Have flu shot today  REturn in 1 year or sooner if having cold or worsening of any symptoms  OV 03/19/2012  Annual followup for gold staage 2 copd and smoking   - smoking: still smoking. Ssays she cannot quit because of social stressor of having to take care of handicapped/mentallychallenged brother for 8 years since her parents death. Will not set quit date or entertain quitting through knows the need to. Feels time is not right    - copd: stable. Very mild dyspnea. Some cough. CAT score is 13 and reflects mild symptom burden. However, reports sinus infection and tooth extraction a week ago finishe antibiotics for a week 5 days ago. Wheeze noticed on exam though denies it subjectively. No fever   CAT COPD Symptom & Quality of Life Score (GSK trademark) 0 is no burden. 5 is highest burden 03/19/2012   Never Cough -> Cough all the time 3  No phlegm in chest -> Chest is full of phlegm 1  No chest tightness -> Chest feels very tight 1  No dyspnea for 1 flight stairs/hill -> Very dyspneic for 1 flight of stairs 2  No limitations for ADL at home -> Very limited with ADL at home 1  Confident leaving home -> Not at all confident leaving home 1  Sleep  soundly -> Do not sleep soundly because of lung condition 2  Lots of Energy -> No energy at all 2  TOTAL Score (max 40)  13     Past med hx: no change Social: no change Fam hx: Brother in and out of hospital   Review of Systems  Constitutional: Negative for fever and unexpected weight change.  HENT: Negative for ear pain, nosebleeds, congestion, sore throat, rhinorrhea, sneezing, trouble swallowing, dental problem, postnasal drip and sinus pressure.   Eyes: Negative for redness and itching.  Respiratory: Negative for cough, chest tightness, shortness of breath and wheezing.   Cardiovascular: Negative for palpitations and leg swelling.  Gastrointestinal: Negative for nausea and vomiting.  Genitourinary: Negative for dysuria.  Musculoskeletal: Negative for joint swelling.  Skin: Negative for rash.  Neurological: Negative for headaches.  Hematological: Does not bruise/bleed easily.  Psychiatric/Behavioral: Negative for dysphoric mood. The patient is not nervous/anxious.        Objective:   Physical Exam Vitals reviewed. Constitutional: She is oriented to person, place, and time. She appears well-developed and well-nourished. No distress.  HENT:  Head: Normocephalic and atraumatic.  Right Ear: External ear normal.  Left Ear: External ear normal.  Mouth/Throat: Oropharynx is clear and moist. No oropharyngeal exudate.  Eyes: Conjunctivae and EOM are normal. Pupils are equal, round, and reactive to light. Right eye exhibits  no discharge. Left eye exhibits no discharge. No scleral icterus.  Neck: Normal range of motion. Neck supple. No JVD present. No tracheal deviation present. No thyromegaly present.  Cardiovascular: Normal rate, regular rhythm, normal heart sounds and intact distal pulses.  Exam reveals no gallop and no friction rub.   No murmur heard. Pulmonary/Chest: Effort normal and breath sounds normal. No respiratory distress. She has MILD WHEEZING +. She has no rales. She  exhibits no tenderness.  Abdominal: Soft. Bowel sounds are normal. She exhibits no distension and no mass. There is no tenderness. There is no rebound and no guarding.  Musculoskeletal: Normal range of motion. She exhibits no edema and no tenderness.  Lymphadenopathy:    She has no cervical adenopathy.  Neurological: She is alert and oriented to person, place, and time. She has normal reflexes. No cranial nerve deficit. She exhibits normal muscle tone. Coordination normal.  Skin: Skin is warm and dry. No rash noted. She is not diaphoretic. No erythema. No pallor.  Psychiatric: She has a normal mood and affect. Her behavior is normal. Judgment and thought content normal.           Assessment & Plan:

## 2012-03-19 NOTE — Assessment & Plan Note (Signed)
Given recent wheeze: in few weeks have full PFT breathing test at [facility] -and have them fax results to me - this is for annual review of lung function  Continue spiriva  Have flu shot in fall  Return back in 1 year

## 2012-04-08 ENCOUNTER — Ambulatory Visit (HOSPITAL_COMMUNITY)
Admission: RE | Admit: 2012-04-08 | Discharge: 2012-04-08 | Disposition: A | Discharge status: Home / Self Care | Payer: BC Managed Care – PPO | Source: Ambulatory Visit | Attending: Internal Medicine | Admitting: Internal Medicine

## 2012-04-08 DIAGNOSIS — R0602 Shortness of breath: Secondary | ICD-10-CM | POA: Insufficient documentation

## 2012-04-08 DIAGNOSIS — J449 Chronic obstructive pulmonary disease, unspecified: Secondary | ICD-10-CM

## 2012-04-08 MED ORDER — ALBUTEROL SULFATE (5 MG/ML) 0.5% IN NEBU
2.5000 mg | INHALATION_SOLUTION | Freq: Once | RESPIRATORY_TRACT | Status: AC
  Administered 2012-04-08: 2.5 mg via RESPIRATORY_TRACT

## 2012-04-09 NOTE — Procedures (Signed)
NAMESANAM, MARMO NO.:  [id number]  MEDICAL RECORD NO.:  [id number]  LOCATION:  RESP                          FACILITY:  [facility]  PHYSICIAN:  Heberto Sturdevant L. Juanetta Gosling, M.D.DATE OF BIRTH:  08/19/1950  DATE OF PROCEDURE: DATE OF DISCHARGE:  04/08/2012                           PULMONARY FUNCTION TEST   1. Spirometry shows no ventilatory defect and mild airflow     obstruction, most marked in the smaller airways. 2. Lung volumes show air trapping. 3. DLCO is normal. 4. Airway resistance is somewhat elevated confirming the presence of     airflow obstruction. 5. There is improvement with inhaled bronchodilator, but it does not     reach the level of significance.     Maressa Apollo L. Juanetta Gosling, M.D.     ELH/MEDQ  D:  04/08/2012  T:  04/09/2012  Job:  [id number]  cc:   Kalman Shan, MD [address] [place] [address] [place] [address]

## 2012-04-12 LAB — PULMONARY FUNCTION TEST

## 2012-05-13 ENCOUNTER — Telehealth: Payer: Self-pay | Admitting: Internal Medicine

## 2012-05-13 NOTE — Progress Notes (Signed)
Quick Note:  Called, spoke with pt. Informed her of results per MR. She verbalized understanding. ______

## 2012-05-13 NOTE — Telephone Encounter (Signed)
Was calling to inform pt:  Notes Recorded by Kalman Shan, MD on 05/12/2012 at 3:30 PM Jen, Please let patient know it is normal result   This was PFT result.  -----  Spoke with pt.  Informed her results normal.  She verbalized understanding and voiced no further questions/concerns at this time.

## 2012-09-14 ENCOUNTER — Telehealth: Payer: Self-pay | Admitting: Internal Medicine

## 2012-09-14 MED ORDER — TIOTROPIUM BROMIDE MONOHYDRATE 18 MCG IN CAPS: ORAL_CAPSULE | RESPIRATORY_TRACT | Status: DC

## 2012-09-14 NOTE — Telephone Encounter (Signed)
I spoke with pt. Aware RX has been sent. Nothing further was needed.

## 2013-03-17 ENCOUNTER — Other Ambulatory Visit: Payer: Self-pay | Admitting: Cardiovascular Disease

## 2013-03-17 NOTE — Telephone Encounter (Signed)
Patient has not been seen sine 05/2011. Rx refused.

## 2013-03-23 ENCOUNTER — Ambulatory Visit (INDEPENDENT_AMBULATORY_CARE_PROVIDER_SITE_OTHER): Payer: BC Managed Care – PPO | Admitting: Internal Medicine

## 2013-03-23 ENCOUNTER — Encounter: Payer: Self-pay | Admitting: Internal Medicine

## 2013-03-23 VITALS — BP 112/70 | HR 87 | Temp 98.0°F | Ht 65.0 in | Wt 154.0 lb

## 2013-03-23 DIAGNOSIS — J449 Chronic obstructive pulmonary disease, unspecified: Secondary | ICD-10-CM

## 2013-03-23 DIAGNOSIS — J4489 Other specified chronic obstructive pulmonary disease: Secondary | ICD-10-CM

## 2013-03-23 NOTE — Patient Instructions (Addendum)
#  COPD - Stable disease - Continue daily Spiriva - Lung function impairment is just mild;  - Spirometry at followup  #smoking Quit smoking   #Followup  1 year with spirometry at followup At followup will discuss CT scan of the chest for lung cancer screening 

## 2013-03-23 NOTE — Progress Notes (Signed)
Subjective:    Patient ID: Brandi Bean, female    DOB: 10-09-1950, 62 y.o.   MRN: [id number]  HPI Problem list 1. Tobacco abuse: refused chantix and wellburin and patch - summer 2011 due to prior failure with same 2. 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 Pulmonary micronodules - stable Oct 2008, April 2009 and June 2010 with no further followup planned  OV 04/04/2011: Followup for above. Last visit was in July 2011. COPD stabel. Dyspnea is class 1-2. No cough. Compliant with spiriva. No hx of AECOPD or admissions. Still smoking. Unable to quit. Recent stressors: mentally handicapped brother in and out of hospital.   REC Please take spiriva sample and 1 year refill  Please work on quitting smoking as much as you can  Have flu shot today  REturn in 1 year or sooner if having cold or worsening of any symptoms  OV 03/19/2012  Annual followup for gold staage 2 copd and smoking   - smoking: still smoking. Ssays she cannot quit because of social stressor of having to take care of handicapped/mentallychallenged brother for 8 years since her parents death. Will not set quit date or entertain quitting through knows the need to. Feels time is not right    - copd: stable. Very mild dyspnea. Some cough. CAT score is 13 and reflects mild symptom burden. However, reports sinus infection and tooth extraction a week ago finishe antibiotics for a week 5 days ago. Wheeze noticed on exam though denies it subjectively. No fever       OV 03/23/2013  Annual fu for gold stge 2 copd and smoking  -  - Pulmonary nodules: She's had stable pulmonary nodules within October 2008 in June 2010. This visit I discuss lung cancer screening with her but she declined to pay $100 out of pocket for this  - smoking: still smoking. sAys she has quit down. Doubt will ever quit  - copd: stable. COPD CAt scoreis 9 and reflecs midl symptom burden. She had pulmonary function test in October 2013 and I  thought this was normal, she continues with Spiriva. Is a normal pulmonary function test on Spiriva. Currently denies any problems other than mild basal symptomatology  CAT COPD Symptom & Quality of Life Score (GSK trademark) 0 is no burden. 5 is highest burden 03/19/2012  03/23/2013   Never Cough -> Cough all the time 3 3  No phlegm in chest -> Chest is full of phlegm 1 2  No chest tightness -> Chest feels very tight 1 1  No dyspnea for 1 flight stairs/hill -> Very dyspneic for 1 flight of stairs 2 0  No limitations for ADL at home -> Very limited with ADL at home 1 0  Confident leaving home -> Not at all confident leaving home 1 0  Sleep soundly -> Do not sleep soundly because of lung condition 2 2  Lots of Energy -> No energy at all 2 2  TOTAL Score (max 40)  13 9    Past, Family, Social reviewed: no change since last visit   Review of Systems  Constitutional: Negative for fever and unexpected weight change.  HENT: Negative for ear pain, nosebleeds, congestion, sore throat, rhinorrhea, sneezing, trouble swallowing, dental problem, postnasal drip and sinus pressure.   Eyes: Negative for redness and itching.  Respiratory: Negative for cough, chest tightness, shortness of breath and wheezing.   Cardiovascular: Negative for palpitations and leg swelling.  Gastrointestinal: Negative  for nausea and vomiting.  Genitourinary: Negative for dysuria.  Musculoskeletal: Negative for joint swelling.  Skin: Negative for rash.  Neurological: Negative for headaches.  Hematological: Does not bruise/bleed easily.  Psychiatric/Behavioral: Negative for dysphoric mood. The patient is not nervous/anxious.        Objective:   Physical Exam Vitals reviewed. Constitutional: She is oriented to person, place, and time. She appears well-developed and well-nourished. No distress.  HENT:  Head: Normocephalic and atraumatic.  Right Ear: External ear normal.  Left Ear: External ear normal.  Mouth/Throat:  Oropharynx is clear and moist. No oropharyngeal exudate.  Eyes: Conjunctivae and EOM are normal. Pupils are equal, round, and reactive to light. Right eye exhibits no discharge. Left eye exhibits no discharge. No scleral icterus.  Neck: Normal range of motion. Neck supple. No JVD present. No tracheal deviation present. No thyromegaly present.  Cardiovascular: Normal rate, regular rhythm, normal heart sounds and intact distal pulses.  Exam reveals no gallop and no friction rub.   No murmur heard. Pulmonary/Chest: Effort normal and breath sounds normal. No respiratory distress. She has MILD WHEEZING +. She has no rales. She exhibits no tenderness.  Abdominal: Soft. Bowel sounds are normal. She exhibits no distension and no mass. There is no tenderness. There is no rebound and no guarding.  Musculoskeletal: Normal range of motion. She exhibits no edema and no tenderness.  Lymphadenopathy:    She has no cervical adenopathy.  Neurological: She is alert and oriented to person, place, and time. She has normal reflexes. No cranial nerve deficit. She exhibits normal muscle tone. Coordination normal.  Skin: Skin is warm and dry. No rash noted. She is not diaphoretic. No erythema. No pallor.  Psychiatric: She has a normal mood and affect. Her behavior is normal. Judgment and thought content normal.           Assessment & Plan:

## 2013-03-26 NOTE — Assessment & Plan Note (Signed)
#  COPD - Stable disease - Continue daily Spiriva - Lung function impairment is just mild;  - Spirometry at followup  #smoking Quit smoking   #Followup  1 year with spirometry at followup At followup will discuss CT scan of the chest for lung cancer screening

## 2013-05-18 ENCOUNTER — Telehealth: Payer: Self-pay | Admitting: Internal Medicine

## 2013-05-18 MED ORDER — TIOTROPIUM BROMIDE MONOHYDRATE 18 MCG IN CAPS: ORAL_CAPSULE | RESPIRATORY_TRACT | Status: DC

## 2013-05-18 NOTE — Telephone Encounter (Signed)
rx has been sent to pt pharmacy. Nothing further needed

## 2013-12-20 ENCOUNTER — Other Ambulatory Visit: Payer: Self-pay | Admitting: Internal Medicine

## 2014-01-17 ENCOUNTER — Other Ambulatory Visit (HOSPITAL_COMMUNITY): Payer: Self-pay | Admitting: Internal Medicine

## 2014-01-17 DIAGNOSIS — Z0000 Encounter for general adult medical examination without abnormal findings: Secondary | ICD-10-CM

## 2014-01-19 ENCOUNTER — Ambulatory Visit (HOSPITAL_COMMUNITY)
Admission: RE | Admit: 2014-01-19 | Discharge: 2014-01-19 | Disposition: A | Discharge status: Home / Self Care | Payer: BC Managed Care – PPO | Source: Ambulatory Visit | Attending: Internal Medicine | Admitting: Internal Medicine

## 2014-01-19 DIAGNOSIS — Z1231 Encounter for screening mammogram for malignant neoplasm of breast: Secondary | ICD-10-CM | POA: Insufficient documentation

## 2014-01-19 DIAGNOSIS — Z0000 Encounter for general adult medical examination without abnormal findings: Secondary | ICD-10-CM

## 2014-01-26 ENCOUNTER — Telehealth: Payer: Self-pay | Admitting: *Deleted

## 2014-01-26 NOTE — Telephone Encounter (Signed)
Pt called to tell Tamela Oddi that her insurance will pay 100% if she does not have any polyps and if so she will have to pay for it herself. Please advise [phone]

## 2014-01-26 NOTE — Telephone Encounter (Signed)
LMOM to call.

## 2014-01-31 ENCOUNTER — Other Ambulatory Visit: Payer: Self-pay

## 2014-01-31 ENCOUNTER — Telehealth: Payer: Self-pay

## 2014-01-31 DIAGNOSIS — Z1211 Encounter for screening for malignant neoplasm of colon: Secondary | ICD-10-CM

## 2014-02-01 NOTE — Telephone Encounter (Signed)
Gastroenterology Pre-Procedure Review  Request Date: 01/31/2014 Requesting Physician: Dr. Gerarda Fraction  PATIENT REVIEW QUESTIONS: The patient responded to the following health history questions as indicated:    1. Diabetes Melitis: no 2. Joint replacements in the past 12 months: no 3. Major health problems in the past 3 months: no 4. Has an artificial valve or MVP: no 5. Has a defibrillator: no 6. Has been advised in past to take antibiotics in advance of a procedure like teeth cleaning: no    MEDICATIONS & ALLERGIES:    Patient reports the following regarding taking any blood thinners:   Plavix? no Aspirin? no Coumadin? no  Patient confirms/reports the following medications:  Current Outpatient Prescriptions  Medication Sig Dispense Refill  . albuterol (PROVENTIL HFA;VENTOLIN HFA) 108 (90 BASE) MCG/ACT inhaler Inhale 2 puffs into the lungs every 6 (six) hours as needed.        . bisoprolol-hydrochlorothiazide (ZIAC) 10-6.25 MG per tablet Take 1 tablet by mouth daily.        Marland Kitchen SPIRIVA HANDIHALER 18 MCG inhalation capsule INHALE 1 PUFF DAILY  30 capsule  4   No current facility-administered medications for this visit.    Patient confirms/reports the following allergies:  Allergies  Allergen Reactions  . Codeine Sulfate   . Prednisone     No orders of the defined types were placed in this encounter.    AUTHORIZATION INFORMATION Primary Insurance:   ID #:   Group #:  Pre-Cert / Auth required:  Pre-Cert / Auth #:   Secondary Insurance:   ID #:   Group #:  Pre-Cert / Auth required:  Pre-Cert / Auth #:   SCHEDULE INFORMATION: Procedure has been scheduled as follows:  Date:  02/14/2014       Time:  10:00 AM Location: [facility] Short Stay  This Gastroenterology Pre-Precedure Review Form is being routed to the following provider(s): Barney Drain, MD

## 2014-02-02 NOTE — Telephone Encounter (Signed)
See separate triage.  

## 2014-02-07 ENCOUNTER — Ambulatory Visit (INDEPENDENT_AMBULATORY_CARE_PROVIDER_SITE_OTHER): Payer: BC Managed Care – PPO | Admitting: Internal Medicine

## 2014-02-07 ENCOUNTER — Encounter: Payer: Self-pay | Admitting: Internal Medicine

## 2014-02-07 VITALS — BP 122/86 | HR 77 | Ht 65.0 in | Wt 146.0 lb

## 2014-02-07 DIAGNOSIS — J449 Chronic obstructive pulmonary disease, unspecified: Secondary | ICD-10-CM

## 2014-02-07 MED ORDER — PEG 3350-KCL-NA BICARB-NACL 420 G PO SOLR: 4000.0000 mL | ORAL | Status: DC

## 2014-02-07 MED ORDER — TIOTROPIUM BROMIDE MONOHYDRATE 18 MCG IN CAPS: ORAL_CAPSULE | RESPIRATORY_TRACT | Status: DC

## 2014-02-07 NOTE — Patient Instructions (Addendum)
#  COPD - Stable disease - Continue daily Spiriva - Lung function impairment is modeate but stable in one year - Spirometry at followup in 12 months  #smoking Quit smoking   #Lung nodules  - discussed and respect your decision to decline CT scan  #Followup  1 year with spirometry at followup

## 2014-02-07 NOTE — Telephone Encounter (Signed)
PREPOPIK-DRINK WATER TO KEEP URINE LIGHT YELLOW. FULL LIQUIDS WITH BREAKFAST.  PT SHOULD DROP OFF RX 3 DAYS PRIOR TO PROCEDURE.

## 2014-02-07 NOTE — Telephone Encounter (Signed)
Rx sent to the pharmacy and instructions mailed to pt.  

## 2014-02-07 NOTE — Progress Notes (Signed)
Subjective:    Patient ID: Brandi Bean, female    DOB: [birth year], 63 y.o.   MRN: [id number]  HPI   Problem list 1. Tobacco abuse: refused chantix and wellburin and patch - summer 2011 due to prior failure with same 2. 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 Pulmonary micronodules - stable Oct 2008, April 2009 and June 2010 with no further followup planned  OV 04/04/2011: Followup for above. Last visit was in July 2011. COPD stabel. Dyspnea is class 1-2. No cough. Compliant with spiriva. No hx of AECOPD or admissions. Still smoking. Unable to quit. Recent stressors: mentally handicapped brother in and out of hospital.   REC Please take spiriva sample and 1 year refill  Please work on quitting smoking as much as you can  Have flu shot today  REturn in 1 year or sooner if having cold or worsening of any symptoms  OV 03/19/2012  Annual followup for gold staage 2 copd and smoking   - smoking: still smoking. Ssays she cannot quit because of social stressor of having to take care of handicapped/mentallychallenged brother for 8 years since her parents death. Will not set quit date or entertain quitting through knows the need to. Feels time is not right    - copd: stable. Very mild dyspnea. Some cough. CAT score is 13 and reflects mild symptom burden. However, reports sinus infection and tooth extraction a week ago finishe antibiotics for a week 5 days ago. Wheeze noticed on exam though denies it subjectively. No fever     OV 03/23/2013  Annual fu for gold stge 2 copd and smoking  -  - Pulmonary nodules: She's had stable pulmonary nodules within October 2008 in June 2010. This visit I discuss lung cancer screening with her but she declined to pay $100 out of pocket for this  - smoking: still smoking. sAys she has quit down. Doubt will ever quit  - copd: stable. COPD CAt scoreis 9 and reflecs midl symptom burden. She had pulmonary function test in October 2013 and I  thought this was normal, she continues with Spiriva. Is a normal pulmonary function test on Spiriva. Currently denies any problems other than mild basal symptomatology   OV 02/07/2014  Chief Complaint  Patient presents with  . Follow-up    Pt states her breathing is unchanged. C/o right lateral rib pain with deep breaths and SOB with hot weather and mild cough with intermittent yellow mucous production. Denies CP/tightness.     Annual followup  - Go stage II COPD: Overall stable. Spirometry today if you want Foley to/58%. Ratio 74. Consistent with stable goals was to COPD. Symptoms are not any worse in the last one year including shortness of breath, baseline sputum color which is white and occasionally yellow in sputum volume.  - Lung nodules:/Cancer screening: She has a past history of stable lung nodules over 2 years. Last year I offered her screening CT scan of the chest for lung cancer but she declined due to cost. I offered the same base of the justification of lung nodules but again she does not want that even if insurance covers it. She wants to wait a year.  Smoking: She continues to smoke. She refused to put citing different pressures   CAT COPD Symptom & Quality of Life Score (GSK trademark) 0 is no burden. 5 is highest burden 03/19/2012  03/23/2013   Never Cough -> Cough all the time 3  3  No phlegm in chest -> Chest is full of phlegm 1 2  No chest tightness -> Chest feels very tight 1 1  No dyspnea for 1 flight stairs/hill -> Very dyspneic for 1 flight of stairs 2 0  No limitations for ADL at home -> Very limited with ADL at home 1 0  Confident leaving home -> Not at all confident leaving home 1 0  Sleep soundly -> Do not sleep soundly because of lung condition 2 2  Lots of Energy -> No energy at all 2 2  TOTAL Score (max 40)  13 9    Review of Systems  Constitutional: Negative for fever and unexpected weight change.  HENT: Positive for sinus pressure. Negative for  congestion, dental problem, ear pain, nosebleeds, postnasal drip, rhinorrhea, sneezing, sore throat and trouble swallowing.   Eyes: Negative for redness and itching.  Respiratory: Positive for shortness of breath. Negative for cough, chest tightness and wheezing.   Cardiovascular: Negative for palpitations and leg swelling.  Gastrointestinal: Negative for nausea and vomiting.  Genitourinary: Negative for dysuria.  Musculoskeletal: Negative for joint swelling.  Skin: Negative for rash.  Neurological: Negative for headaches.  Hematological: Does not bruise/bleed easily.  Psychiatric/Behavioral: Negative for dysphoric mood. The patient is not nervous/anxious.        Objective:   Physical Exam  Vitals reviewed. Constitutional: She is oriented to person, place, and time. She appears well-developed and well-nourished. No distress.  HENT:  Head: Normocephalic and atraumatic.  Right Ear: External ear normal.  Left Ear: External ear normal.  Mouth/Throat: Oropharynx is clear and moist. No oropharyngeal exudate.  Eyes: Conjunctivae and EOM are normal. Pupils are equal, round, and reactive to light. Right eye exhibits no discharge. Left eye exhibits no discharge. No scleral icterus.  Neck: Normal range of motion. Neck supple. No JVD present. No tracheal deviation present. No thyromegaly present.  Cardiovascular: Normal rate, regular rhythm, normal heart sounds and intact distal pulses.  Exam reveals no gallop and no friction rub.   No murmur heard. Pulmonary/Chest: Effort normal and breath sounds normal. No respiratory distress. She has no wheezes. She has no rales. She exhibits no tenderness.  Abdominal: Soft. Bowel sounds are normal. She exhibits no distension and no mass. There is no tenderness. There is no rebound and no guarding.  Musculoskeletal: Normal range of motion. She exhibits no edema and no tenderness.  Lymphadenopathy:    She has no cervical adenopathy.  Neurological: She is alert  and oriented to person, place, and time. She has normal reflexes. No cranial nerve deficit. She exhibits normal muscle tone. Coordination normal.  Skin: Skin is warm and dry. No rash noted. She is not diaphoretic. No erythema. No pallor.  Psychiatric: She has a normal mood and affect. Her behavior is normal. Judgment and thought content normal.    Filed Vitals:   02/07/14 1059  BP: 122/86  Pulse: 77  Height: 5\' 5"  (1.651 m)  Weight: 146 lb (66.225 kg)  SpO2: 97%         Assessment & Plan:  #COPD - Stable disease - Continue daily Spiriva - Lung function impairment is modeate - Spirometry at followup in 12 months  #smoking Quit smoking   #Lung nodules  - discussed and respect your decision to decline CT scan  #Followup  1 year with spirometry at followup

## 2014-02-10 ENCOUNTER — Telehealth: Payer: Self-pay

## 2014-02-10 MED ORDER — PEG-KCL-NACL-NASULF-NA ASC-C 100 G PO SOLR: 1.0000 | ORAL | Status: DC

## 2014-02-10 NOTE — Telephone Encounter (Signed)
Pt needs instructions for prep. She prefers to try the Movie prep, prepopik too expensive.   Sent the prescription and faxed instructions to [facility].

## 2014-02-13 ENCOUNTER — Telehealth: Payer: Self-pay

## 2014-02-13 ENCOUNTER — Encounter (HOSPITAL_COMMUNITY): Payer: Self-pay | Admitting: Pharmacy Technician

## 2014-02-13 NOTE — Telephone Encounter (Signed)
Pt is supposed to be doing the Movie prep at 5:00 pm and 7:30 pm. She got confused and started at 2:00 Pm today. Per Laban Emperor, NP. Pt can wait til 5:00 pm to finish first part and do the other at 7:30 pm. Pt is aware and seems to understand now.

## 2014-02-14 ENCOUNTER — Ambulatory Visit (HOSPITAL_COMMUNITY)
Admission: RE | Admit: 2014-02-14 | Discharge: 2014-02-14 | Disposition: A | Discharge status: Home / Self Care | Payer: BC Managed Care – PPO | Source: Ambulatory Visit | Attending: Gastroenterology | Admitting: Gastroenterology

## 2014-02-14 ENCOUNTER — Encounter (HOSPITAL_COMMUNITY)
Admission: RE | Disposition: A | Discharge status: Home / Self Care | Payer: Self-pay | Source: Ambulatory Visit | Attending: Gastroenterology

## 2014-02-14 ENCOUNTER — Encounter (HOSPITAL_COMMUNITY): Payer: Self-pay | Admitting: *Deleted

## 2014-02-14 DIAGNOSIS — F17200 Nicotine dependence, unspecified, uncomplicated: Secondary | ICD-10-CM | POA: Insufficient documentation

## 2014-02-14 DIAGNOSIS — D126 Benign neoplasm of colon, unspecified: Secondary | ICD-10-CM | POA: Insufficient documentation

## 2014-02-14 DIAGNOSIS — I10 Essential (primary) hypertension: Secondary | ICD-10-CM | POA: Insufficient documentation

## 2014-02-14 DIAGNOSIS — Q438 Other specified congenital malformations of intestine: Secondary | ICD-10-CM | POA: Insufficient documentation

## 2014-02-14 DIAGNOSIS — J4489 Other specified chronic obstructive pulmonary disease: Secondary | ICD-10-CM | POA: Insufficient documentation

## 2014-02-14 DIAGNOSIS — Z1211 Encounter for screening for malignant neoplasm of colon: Secondary | ICD-10-CM

## 2014-02-14 DIAGNOSIS — J449 Chronic obstructive pulmonary disease, unspecified: Secondary | ICD-10-CM | POA: Insufficient documentation

## 2014-02-14 DIAGNOSIS — Z79899 Other long term (current) drug therapy: Secondary | ICD-10-CM | POA: Insufficient documentation

## 2014-02-14 DIAGNOSIS — K648 Other hemorrhoids: Secondary | ICD-10-CM | POA: Insufficient documentation

## 2014-02-14 DIAGNOSIS — IMO0001 Reserved for inherently not codable concepts without codable children: Secondary | ICD-10-CM | POA: Insufficient documentation

## 2014-02-14 HISTORY — PX: POLYPECTOMY: SHX5525

## 2014-02-14 HISTORY — PX: COLONOSCOPY: SHX5424

## 2014-02-14 HISTORY — DX: Essential (primary) hypertension: I10

## 2014-02-14 SURGERY — COLONOSCOPY
Anesthesia: Moderate Sedation

## 2014-02-14 MED ORDER — MEPERIDINE HCL 100 MG/ML IJ SOLN
INTRAMUSCULAR | Status: DC | PRN
  Administered 2014-02-14: 25 mg via INTRAVENOUS
  Administered 2014-02-14: 50 mg via INTRAVENOUS

## 2014-02-14 MED ORDER — PROMETHAZINE HCL 25 MG/ML IJ SOLN
12.5000 mg | Freq: Four times a day (QID) | INTRAMUSCULAR | Status: DC | PRN
  Administered 2014-02-14: 12.5 mg via INTRAVENOUS

## 2014-02-14 MED ORDER — PROMETHAZINE HCL 25 MG/ML IJ SOLN
INTRAMUSCULAR | Status: AC
  Filled 2014-02-14: qty 1

## 2014-02-14 MED ORDER — MEPERIDINE HCL 100 MG/ML IJ SOLN
INTRAMUSCULAR | Status: AC
  Filled 2014-02-14: qty 2

## 2014-02-14 MED ORDER — SODIUM CHLORIDE 0.9 % IV SOLN
INTRAVENOUS | Status: DC
  Administered 2014-02-14: 1000 mL via INTRAVENOUS

## 2014-02-14 MED ORDER — SODIUM CHLORIDE 0.9 % IJ SOLN
INTRAMUSCULAR | Status: AC
  Filled 2014-02-14: qty 10

## 2014-02-14 MED ORDER — MIDAZOLAM HCL 5 MG/5ML IJ SOLN
INTRAMUSCULAR | Status: DC | PRN
  Administered 2014-02-14: 1 mg via INTRAVENOUS
  Administered 2014-02-14: 2 mg via INTRAVENOUS

## 2014-02-14 MED ORDER — PROMETHAZINE HCL 25 MG/ML IJ SOLN
12.5000 mg | Freq: Once | INTRAMUSCULAR | Status: AC
  Administered 2014-02-14: 12.5 mg via INTRAVENOUS

## 2014-02-14 MED ORDER — MIDAZOLAM HCL 5 MG/5ML IJ SOLN
INTRAMUSCULAR | Status: AC
  Filled 2014-02-14: qty 10

## 2014-02-14 MED ORDER — STERILE WATER FOR IRRIGATION IR SOLN
Status: DC | PRN
  Administered 2014-02-14: 14:00:00

## 2014-02-14 NOTE — Op Note (Signed)
[facility] [address] [place], [address]   COLONOSCOPY PROCEDURE REPORT  PATIENT: Brandi, Bean  MR#: [id number] BIRTHDATE: Jun 12, 1951 , 89  yrs. old GENDER: Female ENDOSCOPIST: Barney Drain, MD REFERRED TU:[id number] Orson Ape, M.D. PROCEDURE DATE:  02/14/2014 PROCEDURE:   Colonoscopy with snare polypectomy INDICATIONS:Average risk patient for colon cancer. MEDICATIONS: Demerol 75 mg IV, Versed 3 mg IV, and Promethazine (Phenergan) 25mg  IV  DESCRIPTION OF PROCEDURE:    Physical exam was performed.  Informed consent was obtained from the patient after explaining the benefits, risks, and alternatives to procedure.  The patient was connected to monitor and placed in left lateral position. Continuous oxygen was provided by nasal cannula and IV medicine administered through an indwelling cannula.  After administration of sedation and rectal exam, the patients rectum was intubated and the EC-3890Li ([id number])  colonoscope was advanced under direct visualization to the cecum.  The scope was removed slowly by carefully examining the color, texture, anatomy, and integrity mucosa on the way out.  The patient was recovered in endoscopy and discharged home in satisfactory condition.    COLON FINDINGS: A sessile polyp measuring 6 mm in size was found in the sigmoid colon.  A polypectomy was performed using snare cautery.  ANGULATED RECTO-SIGMOID JUNCTION. The SIGMOID colon IS redundant.  Manual abdominal counter-pressure was used to reach the cecum.  The patient was moved on to their back to reach the cecum, and Small internal hemorrhoids were found.  PREP QUALITY: good. CECAL W/D TIME: 15 minutes     COMPLICATIONS: None  ENDOSCOPIC IMPRESSION: 1.   Sessile polyp measuring 6 mm in size was found in the sigmoid colon; polypectomy was performed using snare cautery 2.   The SIGMOID COLON IS redundant 3.   Small internal hemorrhoids   RECOMMENDATIONS: FOLLOW A HIGH  FIBER DIET.  AVOID ITEMS THAT CAUSE BLOATING & GAS. BIOPSY RESULTS SHOULD BE BACK IN 7 DAYS.  Next colonoscopy in 5-10 years WITH AN OVERTUBE/PEDS SCOPE.       _______________________________ Lorrin MaisBarney Drain, MD 02/14/2014 3:46 PM

## 2014-02-14 NOTE — Discharge Instructions (Signed)
You had 1 polyp removed FROM YOUR COLON. You have internal hemorrhoids.    FOLLOW A HIGH FIBER DIET. AVOID ITEMS THAT CAUSE BLOATING & GAS. SEE INFO BELOW.  YOUR BIOPSY RESULTS SHOULD BE BACK IN 7 DAYS.  Next colonoscopy in 5-10 years.   Colonoscopy Care After Read the instructions outlined below and refer to this sheet in the next week. These discharge instructions provide you with general information on caring for yourself after you leave the hospital. While your treatment has been planned according to the most current medical practices available, unavoidable complications occasionally occur. If you have any problems or questions after discharge, call DR. Carter Kassel, [phone].  ACTIVITY  You may resume your regular activity, but move at a slower pace for the next 24 hours.   Take frequent rest periods for the next 24 hours.   Walking will help get rid of the air and reduce the bloated feeling in your belly (abdomen).   No driving for 24 hours (because of the medicine (anesthesia) used during the test).   You may shower.   Do not sign any important legal documents or operate any machinery for 24 hours (because of the anesthesia used during the test).    NUTRITION  Drink plenty of fluids.   You may resume your normal diet as instructed by your doctor.   Begin with a light meal and progress to your normal diet. Heavy or fried foods are harder to digest and may make you feel sick to your stomach (nauseated).   Avoid alcoholic beverages for 24 hours or as instructed.    MEDICATIONS  You may resume your normal medications.   WHAT YOU CAN EXPECT TODAY  Some feelings of bloating in the abdomen.   Passage of more gas than usual.   Spotting of blood in your stool or on the toilet paper  .  IF YOU HAD POLYPS REMOVED DURING THE COLONOSCOPY:  Eat a soft diet IF YOU HAVE NAUSEA, BLOATING, ABDOMINAL PAIN, OR VOMITING.    FINDING OUT THE RESULTS OF YOUR TEST Not all test  results are available during your visit. DR. Oneida Alar WILL CALL YOU WITHIN 7 DAYS OF YOUR PROCEDUE WITH YOUR RESULTS. Do not assume everything is normal if you have not heard from DR. Aadam Zhen IN ONE WEEK, CALL HER OFFICE AT [phone].  SEEK IMMEDIATE MEDICAL ATTENTION AND CALL THE OFFICE: [phone] IF:  You have more than a spotting of blood in your stool.   Your belly is swollen (abdominal distention).   You are nauseated or vomiting.   You have a temperature over 101F.   You have abdominal pain or discomfort that is severe or gets worse throughout the day.   High-Fiber Diet A high-fiber diet changes your normal diet to include more whole grains, legumes, fruits, and vegetables. Changes in the diet involve replacing refined carbohydrates with unrefined foods. The calorie level of the diet is essentially unchanged. The Dietary Reference Intake (recommended amount) for adult males is 38 grams per day. For adult females, it is 25 grams per day. Pregnant and lactating women should consume 28 grams of fiber per day. Fiber is the intact part of a plant that is not broken down during digestion. Functional fiber is fiber that has been isolated from the plant to provide a beneficial effect in the body. PURPOSE  Increase stool bulk.   Ease and regulate bowel movements.   Lower cholesterol.  INDICATIONS THAT YOU NEED MORE FIBER  Constipation and hemorrhoids.  Uncomplicated diverticulosis (intestine condition) and irritable bowel syndrome.   Weight management.   As a protective measure against hardening of the arteries (atherosclerosis), diabetes, and cancer.   GUIDELINES FOR INCREASING FIBER IN THE DIET  Start adding fiber to the diet slowly. A gradual increase of about 5 more grams (2 slices of whole-wheat bread, 2 servings of most fruits or vegetables, or 1 bowl of high-fiber cereal) per day is best. Too rapid an increase in fiber may result in constipation, flatulence, and bloating.    Drink enough water and fluids to keep your urine clear or pale yellow. Water, juice, or caffeine-free drinks are recommended. Not drinking enough fluid may cause constipation.   Eat a variety of high-fiber foods rather than one type of fiber.   Try to increase your intake of fiber through using high-fiber foods rather than fiber pills or supplements that contain small amounts of fiber.   The goal is to change the types of food eaten. Do not supplement your present diet with high-fiber foods, but replace foods in your present diet.  INCLUDE A VARIETY OF FIBER SOURCES  Replace refined and processed grains with whole grains, canned fruits with fresh fruits, and incorporate other fiber sources. White rice, white breads, and most bakery goods contain little or no fiber.   Brown whole-grain rice, buckwheat oats, and many fruits and vegetables are all good sources of fiber. These include: broccoli, Brussels sprouts, cabbage, cauliflower, beets, sweet potatoes, white potatoes (skin on), carrots, tomatoes, eggplant, squash, berries, fresh fruits, and dried fruits.   Cereals appear to be the richest source of fiber. Cereal fiber is found in whole grains and bran. Bran is the fiber-rich outer coat of cereal grain, which is largely removed in refining. In whole-grain cereals, the bran remains. In breakfast cereals, the largest amount of fiber is found in those with "bran" in their names. The fiber content is sometimes indicated on the label.   You may need to include additional fruits and vegetables each day.   In baking, for 1 cup white flour, you may use the following substitutions:   1 cup whole-wheat flour minus 2 tablespoons.   1/2 cup white flour plus 1/2 cup whole-wheat flour.   Polyps, Colon  A polyp is extra tissue that grows inside your body. Colon polyps grow in the large intestine. The large intestine, also called the colon, is part of your digestive system. It is a long, hollow tube at  the end of your digestive tract where your body makes and stores stool. Most polyps are not dangerous. They are benign. This means they are not cancerous. But over time, some types of polyps can turn into cancer. Polyps that are smaller than a pea are usually not harmful. But larger polyps could someday become or may already be cancerous. To be safe, doctors remove all polyps and test them.   WHO GETS POLYPS? Anyone can get polyps, but certain people are more likely than others. You may have a greater chance of getting polyps if:  You are over 50.   You have had polyps before.   Someone in your family has had polyps.   Someone in your family has had cancer of the large intestine.   Find out if someone in your family has had polyps. You may also be more likely to get polyps if you:   Eat a lot of fatty foods   Smoke   Drink alcohol   Do not exercise  Eat too  much   TREATMENT  The caregiver will remove the polyp during sigmoidoscopy or colonoscopy.    PREVENTION There is not one sure way to prevent polyps. You might be able to lower your risk of getting them if you:  Eat more fruits and vegetables and less fatty food.   Do not smoke.   Avoid alcohol.   Exercise every day.   Lose weight if you are overweight.   Eating more calcium and folate can also lower your risk of getting polyps. Some foods that are rich in calcium are milk, cheese, and broccoli. Some foods that are rich in folate are chickpeas, kidney beans, and spinach.   Hemorrhoids Hemorrhoids are dilated (enlarged) veins around the rectum. Sometimes clots will form in the veins. This makes them swollen and painful. These are called thrombosed hemorrhoids. Causes of hemorrhoids include:  Constipation.   Straining to have a bowel movement.   HEAVY LIFTING HOME CARE INSTRUCTIONS  Eat a well balanced diet and drink 6 to 8 glasses of water every day to avoid constipation. You may also use a bulk laxative.    Avoid straining to have bowel movements.   Keep anal area dry and clean.   Do not use a donut shaped pillow or sit on the toilet for long periods. This increases blood pooling and pain.   Move your bowels when your body has the urge; this will require less straining and will decrease pain and pressure.

## 2014-02-14 NOTE — H&P (Signed)
  Primary Care Physician:  Leonides Grills, MD Primary Gastroenterologist:  Dr. Oneida Alar  Pre-Procedure History & Physical: HPI:  Brandi Bean is a 63 y.o. female here for [place].  Past Medical History  Diagnosis Date  . Back pain   . COPD (chronic obstructive pulmonary disease)   . Fibromyalgia   . Hypertension     Past Surgical History  Procedure Laterality Date  . Abdominal hysterectomy      Prior to Admission medications   Medication Sig Start Date End Date Taking? Authorizing Provider  albuterol (PROVENTIL HFA;VENTOLIN HFA) 108 (90 BASE) MCG/ACT inhaler Inhale 2 puffs into the lungs every 6 (six) hours as needed for wheezing or shortness of breath.    Yes Historical Provider, MD  bisoprolol-hydrochlorothiazide (ZIAC) 10-6.25 MG per tablet Take 1 tablet by mouth daily.     Yes Historical Provider, MD  peg 3350 powder (MOVIPREP) 100 G SOLR Take 1 kit (200 g total) by mouth as directed. 02/10/14  Yes Danie Binder, MD  tiotropium (SPIRIVA HANDIHALER) 18 MCG inhalation capsule INHALE 1 PUFF DAILY 02/07/14  Yes Brand Males, MD    Allergies as of 01/31/2014 - Review Complete 01/31/2014  Allergen Reaction Noted  . Codeine sulfate  06/25/2007  . Prednisone  06/25/2007    History reviewed. No pertinent family history.  History   Social History  . Marital Status: Married    Spouse Name: N/A    Number of Children: N/A  . Years of Education: N/A   Occupational History  . Not on file.   Social History Main Topics  . Smoking status: Current Every Day Smoker -- 1.00 packs/day    Types: Cigarettes  . Smokeless tobacco: Never Used  . Alcohol Use: No  . Drug Use: No  . Sexual Activity: Not on file   Other Topics Concern  . Not on file   Social History Narrative  . No narrative on file    Review of Systems: See HPI, otherwise negative ROS   Physical Exam: BP 118/80  Pulse 86  Temp(Src) 97.6 F (36.4 C) (Oral)  Resp 15  Ht $R'5\' 5"'Of$  (1.651 m)   Wt 146 lb (66.225 kg)  BMI 24.30 kg/m2  SpO2 95% General:   Alert,  pleasant and cooperative in NAD Head:  Normocephalic and atraumatic. Neck:  Supple; Lungs:  Clear throughout to auscultation.    Heart:  Regular rate and rhythm. Abdomen:  Soft, nontender and nondistended. Normal bowel sounds, without guarding, and without rebound.   Neurologic:  Alert and  oriented x4;  grossly normal neurologically.  Impression/Plan:     SCREENING  Plan:  1. TCS TODAY

## 2014-02-17 ENCOUNTER — Encounter (HOSPITAL_COMMUNITY): Payer: Self-pay | Admitting: Gastroenterology

## 2014-02-22 ENCOUNTER — Telehealth: Payer: Self-pay | Admitting: Gastroenterology

## 2014-02-22 NOTE — Telephone Encounter (Signed)
Please call pt. She had a simple adenoma removed.    FOLLOW A HIGH FIBER DIET. AVOID ITEMS THAT CAUSE BLOATING & GAS.   Next colonoscopy in 5-10 years.   

## 2014-02-23 NOTE — Telephone Encounter (Signed)
Reminder in EPIC 

## 2014-02-27 NOTE — Assessment & Plan Note (Signed)
COPD - Stable disease - Continue daily Spiriva - Lung function impairment is modeate but stable in one year - Spirometry at followup in 12 months  #smoking Quit smoking   #Lung nodules  - discussed and respect your decision to decline CT scan  #Followup  1 year with spirometry at followup

## 2014-02-27 NOTE — Telephone Encounter (Signed)
Called and informed pt.  

## 2014-05-04 ENCOUNTER — Other Ambulatory Visit (HOSPITAL_COMMUNITY): Payer: Self-pay | Admitting: Family Medicine

## 2014-05-04 ENCOUNTER — Ambulatory Visit (HOSPITAL_COMMUNITY)
Admission: RE | Admit: 2014-05-04 | Discharge: 2014-05-04 | Disposition: A | Discharge status: Home / Self Care | Payer: Medicare Other | Source: Ambulatory Visit | Attending: Family Medicine | Admitting: Family Medicine

## 2014-05-04 DIAGNOSIS — J4489 Other specified chronic obstructive pulmonary disease: Secondary | ICD-10-CM

## 2014-05-04 DIAGNOSIS — R05 Cough: Secondary | ICD-10-CM | POA: Insufficient documentation

## 2014-05-04 DIAGNOSIS — R6889 Other general symptoms and signs: Secondary | ICD-10-CM | POA: Insufficient documentation

## 2014-05-04 DIAGNOSIS — J209 Acute bronchitis, unspecified: Secondary | ICD-10-CM

## 2014-05-04 DIAGNOSIS — J449 Chronic obstructive pulmonary disease, unspecified: Secondary | ICD-10-CM

## 2014-05-04 DIAGNOSIS — J440 Chronic obstructive pulmonary disease with acute lower respiratory infection: Secondary | ICD-10-CM

## 2014-09-21 DIAGNOSIS — J449 Chronic obstructive pulmonary disease, unspecified: Secondary | ICD-10-CM | POA: Diagnosis not present

## 2014-09-21 DIAGNOSIS — Z6823 Body mass index (BMI) 23.0-23.9, adult: Secondary | ICD-10-CM | POA: Diagnosis not present

## 2014-10-24 ENCOUNTER — Other Ambulatory Visit: Payer: Self-pay | Admitting: Internal Medicine

## 2014-11-02 DIAGNOSIS — N342 Other urethritis: Secondary | ICD-10-CM | POA: Diagnosis not present

## 2014-11-02 DIAGNOSIS — Z6823 Body mass index (BMI) 23.0-23.9, adult: Secondary | ICD-10-CM | POA: Diagnosis not present

## 2015-02-08 ENCOUNTER — Ambulatory Visit: Payer: BC Managed Care – PPO | Admitting: Pulmonary Disease

## 2015-02-16 DIAGNOSIS — Z6824 Body mass index (BMI) 24.0-24.9, adult: Secondary | ICD-10-CM | POA: Diagnosis not present

## 2015-02-16 DIAGNOSIS — N759 Disease of Bartholin's gland, unspecified: Secondary | ICD-10-CM | POA: Diagnosis not present

## 2015-02-16 DIAGNOSIS — Z1389 Encounter for screening for other disorder: Secondary | ICD-10-CM | POA: Diagnosis not present

## 2015-02-19 ENCOUNTER — Ambulatory Visit (INDEPENDENT_AMBULATORY_CARE_PROVIDER_SITE_OTHER): Payer: Medicare Other | Admitting: Internal Medicine

## 2015-02-19 ENCOUNTER — Encounter: Payer: Self-pay | Admitting: Internal Medicine

## 2015-02-19 VITALS — BP 116/76 | HR 83 | Ht 65.0 in | Wt 138.0 lb

## 2015-02-19 DIAGNOSIS — Z23 Encounter for immunization: Secondary | ICD-10-CM | POA: Diagnosis not present

## 2015-02-19 DIAGNOSIS — J449 Chronic obstructive pulmonary disease, unspecified: Secondary | ICD-10-CM | POA: Diagnosis not present

## 2015-02-19 DIAGNOSIS — Z720 Tobacco use: Secondary | ICD-10-CM | POA: Diagnosis not present

## 2015-02-19 DIAGNOSIS — Z129 Encounter for screening for malignant neoplasm, site unspecified: Secondary | ICD-10-CM | POA: Diagnosis not present

## 2015-02-19 DIAGNOSIS — F17200 Nicotine dependence, unspecified, uncomplicated: Secondary | ICD-10-CM

## 2015-02-19 NOTE — Progress Notes (Signed)
Subjective:    Patient ID: Brandi Bean, female    DOB: [birth year], 64 y.o.   MRN: [id number]  HPI   Problem list 1. Tobacco abuse: refused chantix and wellburin and patch - summer 2011 due to prior failure with same 2. 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 Pulmonary micronodules - stable Oct 2008, April 2009 and June 2010 with no further followup planned  OV 04/04/2011: Followup for above. Last visit was in July 2011. COPD stabel. Dyspnea is class 1-2. No cough. Compliant with spiriva. No hx of AECOPD or admissions. Still smoking. Unable to quit. Recent stressors: mentally handicapped brother in and out of hospital.   REC Please take spiriva sample and 1 year refill  Please work on quitting smoking as much as you can  Have flu shot today  REturn in 1 year or sooner if having cold or worsening of any symptoms  OV 03/19/2012  Annual followup for gold staage 2 copd and smoking   - smoking: still smoking. Ssays she cannot quit because of social stressor of having to take care of handicapped/mentallychallenged brother for 8 years since her parents death. Will not set quit date or entertain quitting through knows the need to. Feels time is not right    - copd: stable. Very mild dyspnea. Some cough. CAT score is 13 and reflects mild symptom burden. However, reports sinus infection and tooth extraction a week ago finishe antibiotics for a week 5 days ago. Wheeze noticed on exam though denies it subjectively. No fever     OV 03/23/2013  Annual fu for gold stge 2 copd and smoking  -  - Pulmonary nodules: She's had stable pulmonary nodules within October 2008 in June 2010. This visit I discuss lung cancer screening with her but she declined to pay $100 out of pocket for this  - smoking: still smoking. sAys she has quit down. Doubt will ever quit  - copd: stable. COPD CAt scoreis 9 and reflecs midl symptom burden. She had pulmonary function test in October 2013 and I  thought this was normal, she continues with Spiriva. Is a normal pulmonary function test on Spiriva. Currently denies any problems other than mild basal symptomatology   OV 02/07/2014  Chief Complaint  Patient presents with  . Follow-up    Pt states her breathing is unchanged. C/o right lateral rib pain with deep breaths and SOB with hot weather and mild cough with intermittent yellow mucous production. Denies CP/tightness.     Annual followup  - Go stage II COPD: Overall stable. Spirometry today if you want Foley to/58%. Ratio 74. Consistent with stable goals was to COPD. Symptoms are not any worse in the last one year including shortness of breath, baseline sputum color which is white and occasionally yellow in sputum volume.  - Lung nodules:/Cancer screening: She has a past history of stable lung nodules over 2 years. Last year I offered her screening CT scan of the chest for lung cancer but she declined due to cost. I offered the same base of the justification of lung nodules but again she does not want that even if insurance covers it. She wants to wait a year.  Smoking: She continues to smoke. She refused to put citing different pressures    OV 02/19/2015  Chief Complaint  Patient presents with  . Follow-up    Pt here for yearly f/u. Pt states her breathing is unchange since last OV. Pt  c/o mild dry cough. Pt denies CP/tightness.      COPD: stable. Does not report any worsening shortness of breath or cough. I asked her to fill the cat score but she filled it all wrong. So based on symptoms she is not any worse. Vaccination history is listed below  Tob  reports that she has been smoking Cigarettes.  She has been smoking about 1.00 pack per day. She has never used smokeless tobacco. she does not want to quit. She says that she knows that she needs to quit but she likes smoking  Lung Nodules : She had stable lung nodules as of 2010 . She is eligible for lung cancer screening right  now. One year ago she was not interested but this visit she is interested    CAT COPD Symptom & Quality of Life Score ([place]) 0 is no burden. 5 is highest burden 03/19/2012  03/23/2013   Never Cough -> Cough all the time 3 3  No phlegm in chest -> Chest is full of phlegm 1 2  No chest tightness -> Chest feels very tight 1 1  No dyspnea for 1 flight stairs/hill -> Very dyspneic for 1 flight of stairs 2 0  No limitations for ADL at home -> Very limited with ADL at home 1 0  Confident leaving home -> Not at all confident leaving home 1 0  Sleep soundly -> Do not sleep soundly because of lung condition 2 2  Lots of Energy -> No energy at all 2 2  TOTAL Score (max 40)  13 9    Immunization History  Administered Date(s) Administered  . Influenza Split 05/28/2012, 08/28/2014  . Influenza Whole 05/14/2009, 04/07/2011  . Pneumococcal Polysaccharide-23 05/28/2012     Review of Systems  Constitutional: Negative for fever and unexpected weight change.  HENT: Negative for congestion, dental problem, ear pain, nosebleeds, postnasal drip, rhinorrhea, sinus pressure, sneezing, sore throat and trouble swallowing.   Eyes: Negative for redness and itching.  Respiratory: Positive for cough and shortness of breath. Negative for chest tightness and wheezing.   Cardiovascular: Negative for palpitations and leg swelling.  Gastrointestinal: Negative for nausea and vomiting.  Genitourinary: Negative for dysuria.  Musculoskeletal: Negative for joint swelling.  Skin: Negative for rash.  Neurological: Negative for headaches.  Hematological: Does not bruise/bleed easily.  Psychiatric/Behavioral: Negative for dysphoric mood. The patient is not nervous/anxious.        Objective:   Physical Exam  Constitutional: She is oriented to person, place, and time. She appears well-developed and well-nourished. No distress.  HENT:  Head: Normocephalic and atraumatic.  Right Ear: External ear normal.  Left  Ear: External ear normal.  Mouth/Throat: Oropharynx is clear and moist. No oropharyngeal exudate.  Eyes: Conjunctivae and EOM are normal. Pupils are equal, round, and reactive to light. Right eye exhibits no discharge. Left eye exhibits no discharge. No scleral icterus.  Neck: Normal range of motion. Neck supple. No JVD present. No tracheal deviation present. No thyromegaly present.  Cardiovascular: Normal rate, regular rhythm, normal heart sounds and intact distal pulses.  Exam reveals no gallop and no friction rub.   No murmur heard. Pulmonary/Chest: Effort normal and breath sounds normal. No respiratory distress. She has no wheezes. She has no rales. She exhibits no tenderness.  Abdominal: Soft. Bowel sounds are normal. She exhibits no distension and no mass. There is no tenderness. There is no rebound and no guarding.  Musculoskeletal: Normal range of motion. She exhibits no  edema or tenderness.  Lymphadenopathy:    She has no cervical adenopathy.  Neurological: She is alert and oriented to person, place, and time. She has normal reflexes. No cranial nerve deficit. She exhibits normal muscle tone. Coordination normal.  Skin: Skin is warm and dry. No rash noted. She is not diaphoretic. No erythema. No pallor.  Psychiatric: She has a normal mood and affect. Her behavior is normal. Judgment and thought content normal.  Vitals reviewed.   Filed Vitals:   02/19/15 1158  BP: 116/76  Pulse: 83  Height: 5\' 5"  (1.651 m)  Weight: 138 lb (62.596 kg)  SpO2: 96%         Assessment & Plan:     ICD-9-CM ICD-10-CM   1. COPD, moderate 496 J44.9   2. Smoking 305.1 Z72.0   3. Cancer screening V76.9 Z12.9    #COPD - Stable disease - Continue daily Spiriva - Have PREVNAR vaccine 02/19/2015 - Do fLu shot in fall  #smoking Quit smoking asap  #Lung nodules /Lung cancer screen  -  Ms Eric Form ACNP can discuss this with you  - issue not addressed by me   #Followup  1 year with  spirometry and CAT score  at followup  Dr. Brand Males, M.D., [facility].C.P Pulmonary and Critical Care Medicine Staff Physician [place] [facility] Pager: [phone], If no answer or between  15:00h - 7:00h: call [phone]  02/19/2015 12:22 PM

## 2015-02-19 NOTE — Patient Instructions (Addendum)
#  COPD - Stable disease - Continue daily Spiriva - Have PREVNAR vaccine 02/19/2015 - Do fLu shot in fall  #smoking Quit smoking asap  #Lung nodules /Lung cancer screen  - will see if Ms Eric Form ACNP can discuss this with you now  #Followup  1 year with spirometry and CAT score  at followup

## 2015-02-20 ENCOUNTER — Telehealth: Payer: Self-pay | Admitting: Acute Care

## 2015-02-20 ENCOUNTER — Other Ambulatory Visit: Payer: Self-pay | Admitting: Acute Care

## 2015-02-20 DIAGNOSIS — F17210 Nicotine dependence, cigarettes, uncomplicated: Secondary | ICD-10-CM

## 2015-02-20 NOTE — Telephone Encounter (Signed)
I called Brandi Bean at the request of Dr. Chase Caller for a referral for lung cancer screening. I have her scheduled for a shared decision making visit 02/23/15 at 10:30 am.She is scheduled for the screening CT at 11:30 at [facility]. She verbalized understanding of both the time and location of these appointments.She has my contact information in the event she has any further questions for me before then.

## 2015-02-23 ENCOUNTER — Ambulatory Visit (INDEPENDENT_AMBULATORY_CARE_PROVIDER_SITE_OTHER): Payer: Medicare Other | Admitting: Acute Care

## 2015-02-23 ENCOUNTER — Telehealth: Payer: Self-pay | Admitting: Acute Care

## 2015-02-23 ENCOUNTER — Encounter: Payer: Self-pay | Admitting: Acute Care

## 2015-02-23 ENCOUNTER — Ambulatory Visit (INDEPENDENT_AMBULATORY_CARE_PROVIDER_SITE_OTHER)
Admission: RE | Admit: 2015-02-23 | Discharge: 2015-02-23 | Disposition: A | Discharge status: Home / Self Care | Payer: Medicare Other | Source: Ambulatory Visit | Attending: Acute Care | Admitting: Acute Care

## 2015-02-23 DIAGNOSIS — F17210 Nicotine dependence, cigarettes, uncomplicated: Secondary | ICD-10-CM

## 2015-02-23 DIAGNOSIS — Z87891 Personal history of nicotine dependence: Secondary | ICD-10-CM | POA: Diagnosis not present

## 2015-02-23 NOTE — Telephone Encounter (Signed)
I called to give Brandi Bean the results of her LDCT. She was not at home. I asked her husband to have her call me so that I can give her the scan results. He verbalized understanding. He said he would have her call me.She has my contact information. I will call again Monday if I have not heard from her by then.

## 2015-02-23 NOTE — Progress Notes (Signed)
Shared Decision Making Visit Lung Cancer Screening Program [phone])   Eligibility:  Age 64 y.o.  Pack Years Smoking History Calculation 30 (# packs/per year x # years smoked)  Recent History of coughing up blood  no  Unexplained weight loss? no ( >Than 15 pounds within the last 6 months )  Prior History Lung / other cancer no (Diagnosis within the last 5 years already requiring surveillance chest CT Scans).  Smoking Status Current Smoker  Former Smokers: Years since quit: NA  Quit Date: Current Smoker  Visit Components:  Discussion included one or more decision making aids. yes  Discussion included risk/benefits of screening. yes  Discussion included potential follow up diagnostic testing for abnormal scans. yes  Discussion included meaning and risk of over diagnosis. yes  Discussion included meaning and risk of False Positives. yes  Discussion included meaning of total radiation exposure. yes  Counseling Included:  Importance of adherence to annual lung cancer LDCT screening. yes  Impact of comorbidities on ability to participate in the program. yes  Ability and willingness to under diagnostic treatment. yes  Smoking Cessation Counseling:  Current Smokers:   Discussed importance of smoking cessation. yes  Information about tobacco cessation classes and interventions provided to patient. yes  Patient provided with "ticket" for LDCT Scan. yes  Symptomatic Patient. no  Counseling:NA  Diagnosis Code: Tobacco Use Z72.0  Asymptomatic Patient yes  Counseling (Intermediate counseling: > three minutes counseling) D9833  Former Smokers:   Discussed the importance of maintaining cigarette abstinence. NA  Diagnosis Code: Personal History of Nicotine Dependence. A25.053  Information about tobacco cessation classes and interventions provided to patient. Yes  Patient provided with "ticket" for LDCT Scan. yes  Written Order for Lung Cancer Screening with LDCT  placed in Epic. Yes (CT Chest Lung Cancer Screening Low Dose W/O CM) [id number] Z12.2-Screening of respiratory organs Z87.891-Personal history of nicotine dependence  I spent 20 minutes of face to face time with Brandi Bean  talking about the risks and benefits of the Lung Cancer Screening Program. We viewed a power point together, stopping at intervals to allow for questions to be asked and answered. We reviewed all the risks and benefits noted above. We paused to allow for discussion as needed to ensure understanding.I told Mrs. Pritt that the single most powerful thing she can do to decrease her risk of developing lung cancer is to quit smoking. We discussed that she has tried Chantix, but was unable to take it due to the adverse drug reaction of profound nausea. She has also tried nicodern patches, which she said worked for 3 weeks and then no longer worked. We talked about Wellbutrin possibly being an option, but she is not ready to quit currently as she has many family members who are sick, and relying on her, which is stressful.I have told her to set small goals. Try and smoke 1 fewer cigarettes a day for a week. I have given her the " Be stronger than your excuses" card with community resources for classes and support groups as well as resources to help with getting Nicoderm patches. I have given her my card and contact information and told her to call me when she is ready to quit. I will help in any way I can to help her reach this goal. She verbalized understanding and said she will call me when she is ready to try again. I also gave her a copy of the power point we viewed together as a resource to  refer to in the event she has any questions  in the future. She has a ticket to ride the scanner, and verbalized understanding of the appointment  time and location for her  CT scan. I have told her I will call with the scan results between now and early next week.She verbalized understanding of all of the  above, and had no further questions for me as she left.  Magdalen Spatz, NP

## 2015-02-26 ENCOUNTER — Telehealth: Payer: Self-pay | Admitting: Acute Care

## 2015-02-26 NOTE — Telephone Encounter (Signed)
Brandi Bean has called me back for the results of her scan. I explained to her that the scan resulted in a Lung RADS 2; nodules with a very low likelihood of becoming a clinically active cancer due to size or lack of growth. I explained that the recommendation is for a 12 month annual scan. I told her that I will call her to schedule the scan in about 12 months.She verbalized understanding. I also told her that if she had any changes in her health, like unexplained weight loss, or coughing up blood to call Dr. Chase Caller or me to have that followed up immediately. She verbalized understanding and had no further questions for me.

## 2015-03-27 ENCOUNTER — Other Ambulatory Visit: Payer: Self-pay | Admitting: Internal Medicine

## 2015-04-18 ENCOUNTER — Telehealth: Payer: Self-pay | Admitting: *Deleted

## 2015-04-18 NOTE — Telephone Encounter (Signed)
Oncology Nurse Navigator Documentation  Oncology Nurse Navigator Flowsheets 04/18/2015  Navigator Encounter Type Telephone/Called patient to follow up on smoking cessation. I listened as she explain her smoking.  She is smoking 1/2 pack per day.  I explained tips to quit.  She stated she is trying to get her sister to quit.  I stated that would be great to do together.  I asked that she call me if needing further assistance with smoking cessation.   Treatment Phase Other  Barriers/Navigation Needs Education  Education Smoking cessation  Time Spent with Patient 15

## 2015-06-01 DIAGNOSIS — Z1389 Encounter for screening for other disorder: Secondary | ICD-10-CM | POA: Diagnosis not present

## 2015-06-01 DIAGNOSIS — N342 Other urethritis: Secondary | ICD-10-CM | POA: Diagnosis not present

## 2015-06-13 DIAGNOSIS — Z0001 Encounter for general adult medical examination with abnormal findings: Secondary | ICD-10-CM | POA: Diagnosis not present

## 2015-06-13 DIAGNOSIS — Z6824 Body mass index (BMI) 24.0-24.9, adult: Secondary | ICD-10-CM | POA: Diagnosis not present

## 2015-07-04 ENCOUNTER — Telehealth: Payer: Self-pay | Admitting: Internal Medicine

## 2015-07-04 NOTE — Telephone Encounter (Signed)
Called spoke with pt. Aware no samples at this time. Nothing further needed

## 2015-08-15 DIAGNOSIS — Z6824 Body mass index (BMI) 24.0-24.9, adult: Secondary | ICD-10-CM | POA: Diagnosis not present

## 2015-08-15 DIAGNOSIS — Z1389 Encounter for screening for other disorder: Secondary | ICD-10-CM | POA: Diagnosis not present

## 2015-08-15 DIAGNOSIS — N342 Other urethritis: Secondary | ICD-10-CM | POA: Diagnosis not present

## 2015-08-17 ENCOUNTER — Other Ambulatory Visit (HOSPITAL_COMMUNITY): Payer: Self-pay | Admitting: Physician Assistant

## 2015-08-17 DIAGNOSIS — Z1231 Encounter for screening mammogram for malignant neoplasm of breast: Secondary | ICD-10-CM

## 2015-08-22 ENCOUNTER — Ambulatory Visit (HOSPITAL_COMMUNITY)
Admission: RE | Admit: 2015-08-22 | Discharge: 2015-08-22 | Disposition: A | Discharge status: Home / Self Care | Payer: Medicare Other | Source: Ambulatory Visit | Attending: Physician Assistant | Admitting: Physician Assistant

## 2015-08-22 DIAGNOSIS — Z1231 Encounter for screening mammogram for malignant neoplasm of breast: Secondary | ICD-10-CM | POA: Diagnosis not present

## 2015-08-27 ENCOUNTER — Other Ambulatory Visit: Payer: Self-pay | Admitting: Physician Assistant

## 2015-08-27 DIAGNOSIS — R928 Other abnormal and inconclusive findings on diagnostic imaging of breast: Secondary | ICD-10-CM

## 2015-08-28 ENCOUNTER — Other Ambulatory Visit (HOSPITAL_COMMUNITY): Payer: Self-pay | Admitting: Physician Assistant

## 2015-08-28 DIAGNOSIS — R928 Other abnormal and inconclusive findings on diagnostic imaging of breast: Secondary | ICD-10-CM

## 2015-09-11 ENCOUNTER — Ambulatory Visit (HOSPITAL_COMMUNITY)
Admission: RE | Admit: 2015-09-11 | Discharge: 2015-09-11 | Disposition: A | Discharge status: Home / Self Care | Payer: Medicare Other | Source: Ambulatory Visit | Attending: Physician Assistant | Admitting: Physician Assistant

## 2015-09-11 DIAGNOSIS — R928 Other abnormal and inconclusive findings on diagnostic imaging of breast: Secondary | ICD-10-CM | POA: Insufficient documentation

## 2015-10-16 ENCOUNTER — Other Ambulatory Visit: Payer: Self-pay | Admitting: Acute Care

## 2015-10-16 DIAGNOSIS — F17210 Nicotine dependence, cigarettes, uncomplicated: Secondary | ICD-10-CM

## 2015-10-25 ENCOUNTER — Other Ambulatory Visit (HOSPITAL_COMMUNITY): Payer: Self-pay | Admitting: Physician Assistant

## 2015-11-06 ENCOUNTER — Other Ambulatory Visit (HOSPITAL_COMMUNITY): Payer: Self-pay | Admitting: Physician Assistant

## 2015-11-06 DIAGNOSIS — Z780 Asymptomatic menopausal state: Secondary | ICD-10-CM

## 2015-11-09 ENCOUNTER — Other Ambulatory Visit (HOSPITAL_COMMUNITY): Payer: Self-pay

## 2015-12-03 DIAGNOSIS — B029 Zoster without complications: Secondary | ICD-10-CM | POA: Diagnosis not present

## 2015-12-03 DIAGNOSIS — Z6823 Body mass index (BMI) 23.0-23.9, adult: Secondary | ICD-10-CM | POA: Diagnosis not present

## 2015-12-03 DIAGNOSIS — N342 Other urethritis: Secondary | ICD-10-CM | POA: Diagnosis not present

## 2015-12-03 DIAGNOSIS — Z1389 Encounter for screening for other disorder: Secondary | ICD-10-CM | POA: Diagnosis not present

## 2016-01-30 ENCOUNTER — Encounter: Payer: Self-pay | Admitting: Internal Medicine

## 2016-01-30 ENCOUNTER — Ambulatory Visit (INDEPENDENT_AMBULATORY_CARE_PROVIDER_SITE_OTHER): Payer: Medicare Other | Admitting: Internal Medicine

## 2016-01-30 VITALS — BP 136/90 | HR 82 | Ht 65.0 in | Wt 131.4 lb

## 2016-01-30 DIAGNOSIS — J449 Chronic obstructive pulmonary disease, unspecified: Secondary | ICD-10-CM | POA: Diagnosis not present

## 2016-01-30 MED ORDER — TIOTROPIUM BROMIDE MONOHYDRATE 18 MCG IN CAPS: ORAL_CAPSULE | RESPIRATORY_TRACT | Status: DC

## 2016-01-30 NOTE — Progress Notes (Signed)
History of Present Illness Brandi Bean is a 65 y.o. female smoker with Gold Stage II COPD   7/5/2017Annual Follow Up Appointment: Pt. Presents today for annual follow up for her COPD. She states that her COPD is stable. Symptoms are no worse within the last year including shortness of breath, baseline sputum color is white to yellow.She states she is compliant with her Spiriva nightly. She is continuing to smoke. She has tried Chantix without success. She denies fever, cough, chest pain, purulent sputum,orthopnea, hemoptysis , leg or calf pain. She states she has lost 5 pounds this year. She is due for her annual lung cancer screening CT this month.     Past medical hx Past Medical History  Diagnosis Date  . Back pain   . COPD (chronic obstructive pulmonary disease) ([place])   . Fibromyalgia   . Hypertension      Past surgical hx, Family hx, Social hx all reviewed.  Current Outpatient Prescriptions on File Prior to Visit  Medication Sig  . albuterol (PROVENTIL HFA;VENTOLIN HFA) 108 (90 BASE) MCG/ACT inhaler Inhale 2 puffs into the lungs every 6 (six) hours as needed for wheezing or shortness of breath.   . bisoprolol-hydrochlorothiazide (ZIAC) 10-6.25 MG per tablet Take 1 tablet by mouth daily.     No current facility-administered medications on file prior to visit.     Allergies  Allergen Reactions  . Codeine Sulfate Itching  . Prednisone     Patient can't remember reaction.    Review Of Systems:  Constitutional:   No  weight loss, night sweats,  Fevers, chills, fatigue, or  lassitude.  HEENT:   No headaches,  Difficulty swallowing,  Tooth/dental problems, or  Sore throat,                No sneezing, itching, ear ache, nasal congestion, post nasal drip,   CV:  No chest pain,  Orthopnea, PND, swelling in lower extremities, anasarca, dizziness, palpitations, syncope.   GI  No heartburn, indigestion, abdominal pain, nausea, vomiting, diarrhea, change in bowel habits,  loss of appetite, bloody stools.   Resp: No  shortness of breath with exertion or at rest.  No excess mucus, no productive cough,  No non-productive cough,  No coughing up of blood.  No change in color of mucus.  No wheezing.  No chest wall deformity  Skin: no rash or lesions.  GU: no dysuria, change in color of urine, no urgency or frequency.  No flank pain, no hematuria   MS:  No joint pain or swelling.  No decreased range of motion.  No back pain.  Psych:  No change in mood or affect. No depression or anxiety.  No memory loss.   Vital Signs BP 136/90 mmHg  Pulse 82  Ht 5\' 5"  (1.651 m)  Wt 131 lb 6.4 oz (59.603 kg)  BMI 21.87 kg/m2  SpO2 96%   Physical Exam:  General- No distress,  A&Ox3, pleasant ENT: No sinus tenderness, TM clear, pale nasal mucosa, no oral exudate,+ post nasal drip, no LAN Cardiac: S1, S2, regular rate and rhythm, no murmur Chest: No wheeze/ rales/ dullness; no accessory muscle use, no nasal flaring, no sternal retractions Abd.: Soft Non-tender Ext: No clubbing cyanosis, edema Neuro:  normal strength Skin: No rashes, warm and dry Psych: normal mood and behavior   Assessment/Plan  COPD, moderate Stable Gold Stage II COPD CAT Score 12 Plan: We will send in a prescription for your Spiriva. Continue taking your Spiriva  every night as you have been doing. We will schedule a CT Screening for the end of this month. Please try to stop smoking. It is the single most powerful action you can take to improve your pulmonary function. Please add Claritin 10 mg daily for seasonal allergies. Please add Flonase 2 puffs in each in each nostril once daily for seasonal allergies. Since you have been stable with your COPD for the last several years,, Dr. Chase Caller is agreeable to having this managed by your PCP. If you have a change in clinical status, then PFT's  and CT and re-refer to [facility]. We will continue to follow you in the [place] are due to have a scan later this month at [facility]. Please contact office for sooner follow up if symptoms do not improve or worsen or seek emergency care       Magdalen Spatz, NP 01/30/2016  2:21 PM    STAFF NOTE: I, Dr Ann Lions have personally reviewed patient's available data, including medical history, events of note, physical examination and test results as part of my evaluation. I have discussed with resident/NP and other care providers such as pharmacist, RN and RRT.  In addition,  I personally evaluated patient and elicited key findings of    S: Gold stage II COPD annual follow-up. On Spiriva She continues to smoke. She has no new complaints. She is in [place], [place]. She finds it difficult to come here. She would prefer because she stable to have a follow-up with a new primary care physician Glo Herring., MD Her last spirometry was in July 2015 with FEV1 58%. She also has annual low dose lung cancer screening CT for cancer and this was in July 2016  O: Looks the same   no oral thrush Lungs are clear to auscultation  A: Gold stage II COPD stable on Spiriva lung cancer screening - third and will CT scan low-dose pending July 2017 Smoking - unable to quit  P: given symptom stability and her desire to follow-up at [facility] she can follow up with primary care physician Glo Herring., MD -  all he can refer her to local pulmonologist Dr. Sinda Du. Recommend maintaining on Spiriva and getting spirometry every few years (2-4 years)  Low-dose screening scan for lung cancer can be done through [place] remotely    advised to quit smoking  We'll send this note to her primary care physician     .  Rest per NP/medical resident whose note is outlined above and that I agree with  Dr. Brand Males, M.D., [facility].C.P Pulmonary and Critical Care Medicine Staff Physician [place] [facility] Pager: [phone], If no answer or between  15:00h - 7:00h: call [phone]  01/30/2016 2:23 PM

## 2016-01-30 NOTE — Patient Instructions (Addendum)
It is good to see you today. We will send in a prescription for your Spiriva. Continue taking your Spiriva every night as you have been doing. We will schedule a CT Screening for the end of this month. Please try to stop smoking. It is the single most powerful action you can take to improve your pulmonary function. Please add Claritin 10 mg daily for seasonal allergies. Please add Flonase 2 puffs in each in each nostril once daily for seasonal allergies. Since you have been stable with your COPD for the last several years,, Dr. Chase Caller is agreeable to having this managed by your PCP. If you have a change in clinical status, then PFT's  and CT and re-refer to [facility]. We will continue to follow you in the [place] are due to have a scan later this month at [facility]. Please contact office for sooner follow up if symptoms do not improve or worsen or seek emergency care

## 2016-01-30 NOTE — Assessment & Plan Note (Signed)
Stable Gold Stage II COPD CAT Score 12 Plan: We will send in a prescription for your Spiriva. Continue taking your Spiriva every night as you have been doing. We will schedule a CT Screening for the end of this month. Please try to stop smoking. It is the single most powerful action you can take to improve your pulmonary function. Please add Claritin 10 mg daily for seasonal allergies. Please add Flonase 2 puffs in each in each nostril once daily for seasonal allergies. Since you have been stable with your COPD for the last several years,, Dr. Chase Caller is agreeable to having this managed by your PCP. If you have a change in clinical status, then PFT's  and CT and re-refer to [facility]. We will continue to follow you in the [place] are due to have a scan later this month at [facility]. Please contact office for sooner follow up if symptoms do not improve or worsen or seek emergency care

## 2016-02-26 ENCOUNTER — Ambulatory Visit (HOSPITAL_COMMUNITY)
Admission: RE | Admit: 2016-02-26 | Discharge: 2016-02-26 | Disposition: A | Discharge status: Home / Self Care | Payer: Medicare Other | Source: Ambulatory Visit | Attending: Acute Care | Admitting: Acute Care

## 2016-02-26 DIAGNOSIS — Z122 Encounter for screening for malignant neoplasm of respiratory organs: Secondary | ICD-10-CM | POA: Diagnosis not present

## 2016-02-26 DIAGNOSIS — J439 Emphysema, unspecified: Secondary | ICD-10-CM | POA: Insufficient documentation

## 2016-02-26 DIAGNOSIS — Z87891 Personal history of nicotine dependence: Secondary | ICD-10-CM | POA: Diagnosis not present

## 2016-02-26 DIAGNOSIS — F17210 Nicotine dependence, cigarettes, uncomplicated: Secondary | ICD-10-CM | POA: Diagnosis present

## 2016-02-26 DIAGNOSIS — I2510 Atherosclerotic heart disease of native coronary artery without angina pectoris: Secondary | ICD-10-CM | POA: Insufficient documentation

## 2016-02-28 ENCOUNTER — Telehealth: Payer: Self-pay | Admitting: Acute Care

## 2016-02-28 ENCOUNTER — Other Ambulatory Visit: Payer: Self-pay | Admitting: Acute Care

## 2016-02-28 ENCOUNTER — Encounter: Payer: Self-pay | Admitting: Acute Care

## 2016-02-28 DIAGNOSIS — Z79891 Long term (current) use of opiate analgesic: Secondary | ICD-10-CM | POA: Diagnosis not present

## 2016-02-28 DIAGNOSIS — F17210 Nicotine dependence, cigarettes, uncomplicated: Secondary | ICD-10-CM

## 2016-02-28 DIAGNOSIS — Z1389 Encounter for screening for other disorder: Secondary | ICD-10-CM | POA: Diagnosis not present

## 2016-02-28 DIAGNOSIS — Z6822 Body mass index (BMI) 22.0-22.9, adult: Secondary | ICD-10-CM | POA: Diagnosis not present

## 2016-02-28 DIAGNOSIS — N342 Other urethritis: Secondary | ICD-10-CM | POA: Diagnosis not present

## 2016-02-28 DIAGNOSIS — G894 Chronic pain syndrome: Secondary | ICD-10-CM | POA: Diagnosis not present

## 2016-02-28 NOTE — Progress Notes (Signed)
I have called this patient with the resuts of her low dose screening CT. I explained that her scan was read as a Lung RADS 2: nodules that are benign in appearance and behavior with a very low likelihood of becoming a clinically active cancer due to size or lack of growth. Recommendation per radiology is for a repeat LDCT in 12 months. I told her we will call and schedule her repeat scan in 12 months. She verbalized understanding and had no further questions at the time we ended the call. She has my contact information in the event she has questions in the future.

## 2016-02-28 NOTE — Telephone Encounter (Signed)
I have attempted to call the low dose CT results to this patient for the last 2 days. There is no answer at her phone, and no option to leave a message. I have called her husbands cell Salvatore Marvel) at [phone] and he is going to relay the message for her to call me for results. I have given him my contact information for Mrs. Lanum return my call. I will await her call.

## 2016-04-29 DIAGNOSIS — Z6822 Body mass index (BMI) 22.0-22.9, adult: Secondary | ICD-10-CM | POA: Diagnosis not present

## 2016-04-29 DIAGNOSIS — N342 Other urethritis: Secondary | ICD-10-CM | POA: Diagnosis not present

## 2016-04-29 DIAGNOSIS — Z1389 Encounter for screening for other disorder: Secondary | ICD-10-CM | POA: Diagnosis not present

## 2016-04-29 DIAGNOSIS — R350 Frequency of micturition: Secondary | ICD-10-CM | POA: Diagnosis not present

## 2016-06-02 DIAGNOSIS — M545 Low back pain: Secondary | ICD-10-CM | POA: Diagnosis not present

## 2016-06-02 DIAGNOSIS — Z6822 Body mass index (BMI) 22.0-22.9, adult: Secondary | ICD-10-CM | POA: Diagnosis not present

## 2016-06-02 DIAGNOSIS — I10 Essential (primary) hypertension: Secondary | ICD-10-CM | POA: Diagnosis not present

## 2016-06-02 DIAGNOSIS — J449 Chronic obstructive pulmonary disease, unspecified: Secondary | ICD-10-CM | POA: Diagnosis not present

## 2016-06-02 DIAGNOSIS — Z23 Encounter for immunization: Secondary | ICD-10-CM | POA: Diagnosis not present

## 2016-06-02 DIAGNOSIS — M542 Cervicalgia: Secondary | ICD-10-CM | POA: Diagnosis not present

## 2016-06-06 ENCOUNTER — Ambulatory Visit (HOSPITAL_COMMUNITY)
Admission: RE | Admit: 2016-06-06 | Discharge: 2016-06-06 | Disposition: A | Discharge status: Home / Self Care | Payer: Medicare Other | Source: Ambulatory Visit | Attending: Registered Nurse | Admitting: Registered Nurse

## 2016-06-06 ENCOUNTER — Other Ambulatory Visit (HOSPITAL_COMMUNITY): Payer: Self-pay | Admitting: Registered Nurse

## 2016-06-06 DIAGNOSIS — M50321 Other cervical disc degeneration at C4-C5 level: Secondary | ICD-10-CM | POA: Diagnosis not present

## 2016-06-06 DIAGNOSIS — M542 Cervicalgia: Secondary | ICD-10-CM | POA: Diagnosis not present

## 2016-06-11 ENCOUNTER — Other Ambulatory Visit (HOSPITAL_COMMUNITY): Payer: Self-pay | Admitting: Registered Nurse

## 2016-06-11 DIAGNOSIS — M5410 Radiculopathy, site unspecified: Secondary | ICD-10-CM

## 2016-06-18 ENCOUNTER — Ambulatory Visit (HOSPITAL_COMMUNITY)
Admission: RE | Admit: 2016-06-18 | Discharge: 2016-06-18 | Disposition: A | Discharge status: Home / Self Care | Payer: Medicare Other | Source: Ambulatory Visit | Attending: Registered Nurse | Admitting: Registered Nurse

## 2016-06-18 DIAGNOSIS — M2578 Osteophyte, vertebrae: Secondary | ICD-10-CM | POA: Diagnosis not present

## 2016-06-18 DIAGNOSIS — M5410 Radiculopathy, site unspecified: Secondary | ICD-10-CM | POA: Insufficient documentation

## 2016-06-18 DIAGNOSIS — G9529 Other cord compression: Secondary | ICD-10-CM | POA: Diagnosis not present

## 2016-06-18 DIAGNOSIS — M5030 Other cervical disc degeneration, unspecified cervical region: Secondary | ICD-10-CM | POA: Insufficient documentation

## 2016-06-18 DIAGNOSIS — M50321 Other cervical disc degeneration at C4-C5 level: Secondary | ICD-10-CM | POA: Diagnosis not present

## 2016-06-25 DIAGNOSIS — Z1389 Encounter for screening for other disorder: Secondary | ICD-10-CM | POA: Diagnosis not present

## 2016-06-25 DIAGNOSIS — J449 Chronic obstructive pulmonary disease, unspecified: Secondary | ICD-10-CM | POA: Diagnosis not present

## 2016-06-25 DIAGNOSIS — Z0001 Encounter for general adult medical examination with abnormal findings: Secondary | ICD-10-CM | POA: Diagnosis not present

## 2016-06-25 DIAGNOSIS — Z6822 Body mass index (BMI) 22.0-22.9, adult: Secondary | ICD-10-CM | POA: Diagnosis not present

## 2016-07-02 DIAGNOSIS — I10 Essential (primary) hypertension: Secondary | ICD-10-CM | POA: Diagnosis not present

## 2016-07-02 DIAGNOSIS — M4802 Spinal stenosis, cervical region: Secondary | ICD-10-CM | POA: Diagnosis not present

## 2016-07-03 ENCOUNTER — Other Ambulatory Visit: Payer: Self-pay | Admitting: Neurosurgery

## 2016-07-29 NOTE — Pre-Procedure Instructions (Signed)
Brandi Bean  07/29/2016      [place], [address] [place] [address] Phone: [phone] Fax: [phone]    Your procedure is scheduled on Tuesday January 9.  Report to [facility] Admitting at 6:00 A.M.  Call this number if you have problems the morning of surgery:  [phone]   Remember:  Do not eat food or drink liquids after midnight.  Take these medicines the morning of surgery with A SIP OF WATER: bisoprolol-hydrochlorothiazide (Ziac), ocycodone (percocet) if needed, Spiriva, albuterol inhaler if needed (please bring to hospital with you)  7 days prior to surgery STOP taking any Aspirin, Aleve, Naproxen, Ibuprofen, Motrin, Advil, Goody's, BC's, all herbal medications, fish oil, and all vitamins    Do not wear jewelry, make-up or nail polish.  Do not wear lotions, powders, or perfumes, or deoderant.  Do not shave 48 hours prior to surgery.  Men may shave face and neck.  Do not bring valuables to the hospital.  [facility] is not responsible for any belongings or valuables.  Contacts, dentures or bridgework may not be worn into surgery.  Leave your suitcase in the car.  After surgery it may be brought to your room.  For patients admitted to the hospital, discharge time will be determined by your treatment team.  Patients discharged the day of surgery will not be allowed to drive home.   Special instructions:    - Preparing For Surgery  Before surgery, you can play an important role. Because skin is not sterile, your skin needs to be as free of germs as possible. You can reduce the number of germs on your skin by washing with CHG (chlorahexidine gluconate) Soap before surgery.  CHG is an antiseptic cleaner which kills germs and bonds with the skin to continue killing germs even after washing.  Please do not use if you have an allergy to CHG or antibacterial soaps. If your skin becomes  reddened/irritated stop using the CHG.  Do not shave (including legs and underarms) for at least 48 hours prior to first CHG shower. It is OK to shave your face.  Please follow these instructions carefully.   1. Shower the NIGHT BEFORE SURGERY and the MORNING OF SURGERY with CHG.   2. If you chose to wash your hair, wash your hair first as usual with your normal shampoo.  3. After you shampoo, rinse your hair and body thoroughly to remove the shampoo.  4. Use CHG as you would any other liquid soap. You can apply CHG directly to the skin and wash gently with a scrungie or a clean washcloth.   5. Apply the CHG Soap to your body ONLY FROM THE NECK DOWN.  Do not use on open wounds or open sores. Avoid contact with your eyes, ears, mouth and genitals (private parts). Wash genitals (private parts) with your normal soap.  6. Wash thoroughly, paying special attention to the area where your surgery will be performed.  7. Thoroughly rinse your body with warm water from the neck down.  8. DO NOT shower/wash with your normal soap after using and rinsing off the CHG Soap.  9. Pat yourself dry with a CLEAN TOWEL.   10. Wear CLEAN PAJAMAS   11. Place CLEAN SHEETS on your bed the night of your first shower and DO NOT SLEEP WITH PETS.    Day of Surgery: Do not apply any deodorants/lotions.  Please wear clean clothes to the hospital/surgery center.      Please read over the following fact sheets that you were given. MRSA Information

## 2016-07-30 ENCOUNTER — Encounter (HOSPITAL_COMMUNITY): Payer: Self-pay

## 2016-07-30 ENCOUNTER — Encounter (HOSPITAL_COMMUNITY)
Admission: RE | Admit: 2016-07-30 | Discharge: 2016-07-30 | Disposition: A | Discharge status: Home / Self Care | Payer: Medicare Other | Source: Ambulatory Visit | Attending: Neurosurgery | Admitting: Neurosurgery

## 2016-07-30 DIAGNOSIS — Z01812 Encounter for preprocedural laboratory examination: Secondary | ICD-10-CM | POA: Insufficient documentation

## 2016-07-30 DIAGNOSIS — M4802 Spinal stenosis, cervical region: Secondary | ICD-10-CM | POA: Insufficient documentation

## 2016-07-30 DIAGNOSIS — I10 Essential (primary) hypertension: Secondary | ICD-10-CM | POA: Insufficient documentation

## 2016-07-30 DIAGNOSIS — Z01810 Encounter for preprocedural cardiovascular examination: Secondary | ICD-10-CM | POA: Insufficient documentation

## 2016-07-30 HISTORY — DX: Cerebral infarction, unspecified: I63.9

## 2016-07-30 HISTORY — DX: Dyspnea, unspecified: R06.00

## 2016-07-30 HISTORY — DX: Depression, unspecified: F32.A

## 2016-07-30 HISTORY — DX: Urinary tract infection, site not specified: N39.0

## 2016-07-30 HISTORY — DX: Other specified postprocedural states: Z98.890

## 2016-07-30 HISTORY — DX: Major depressive disorder, single episode, unspecified: F32.9

## 2016-07-30 HISTORY — DX: Headache: R51

## 2016-07-30 HISTORY — DX: Headache, unspecified: R51.9

## 2016-07-30 HISTORY — DX: Unspecified osteoarthritis, unspecified site: M19.90

## 2016-07-30 HISTORY — DX: Other specified postprocedural states: R11.2

## 2016-07-30 HISTORY — DX: Sleep apnea, unspecified: G47.30

## 2016-07-30 HISTORY — DX: Anxiety disorder, unspecified: F41.9

## 2016-07-30 LAB — BASIC METABOLIC PANEL
Anion gap: 6 (ref 5–15)
BUN: 10 mg/dL (ref 6–20)
CALCIUM: 9.8 mg/dL (ref 8.9–10.3)
CO2: 30 mmol/L (ref 22–32)
CREATININE: 0.95 mg/dL (ref 0.44–1.00)
Chloride: 105 mmol/L (ref 101–111)
GFR calc non Af Amer: >60 mL/min (ref >60)
GLUCOSE: 99 mg/dL (ref 65–99)
Potassium: 4.1 mmol/L (ref 3.5–5.1)
Sodium: 141 mmol/L (ref 135–145)

## 2016-07-30 LAB — CBC WITH DIFFERENTIAL/PLATELET
BASOS PCT: 0 %
Basophils Absolute: 0 10*3/uL (ref 0.0–0.1)
EOS ABS: 0.1 10*3/uL (ref 0.0–0.7)
Eosinophils Relative: 1 %
HCT: 49.3 % — ABNORMAL HIGH (ref 36.0–46.0)
Hemoglobin: 17.1 g/dL — ABNORMAL HIGH (ref 12.0–15.0)
Lymphocytes Relative: 31 %
Lymphs Abs: 3.1 10*3/uL (ref 0.7–4.0)
MCH: 31.8 pg (ref 26.0–34.0)
MCHC: 34.7 g/dL (ref 30.0–36.0)
MCV: 91.6 fL (ref 78.0–100.0)
MONO ABS: 0.6 10*3/uL (ref 0.1–1.0)
MONOS PCT: 6 %
NEUTROS PCT: 62 %
Neutro Abs: 6.2 10*3/uL (ref 1.7–7.7)
PLATELETS: 209 10*3/uL (ref 150–400)
RBC: 5.38 MIL/uL — ABNORMAL HIGH (ref 3.87–5.11)
RDW: 12.9 % (ref 11.5–15.5)
WBC: 10.1 10*3/uL (ref 4.0–10.5)

## 2016-07-30 LAB — SURGICAL PCR SCREEN
MRSA, PCR: NEGATIVE
STAPHYLOCOCCUS AUREUS: NEGATIVE

## 2016-08-05 ENCOUNTER — Inpatient Hospital Stay (HOSPITAL_COMMUNITY): Payer: Medicare Other

## 2016-08-05 ENCOUNTER — Encounter (HOSPITAL_COMMUNITY): Payer: Self-pay | Admitting: *Deleted

## 2016-08-05 ENCOUNTER — Encounter (HOSPITAL_COMMUNITY)
Admission: RE | Disposition: A | Discharge status: Home / Self Care | Payer: Self-pay | Source: Ambulatory Visit | Attending: Neurosurgery

## 2016-08-05 ENCOUNTER — Inpatient Hospital Stay (HOSPITAL_COMMUNITY)
Admission: RE | Admit: 2016-08-05 | Discharge: 2016-08-06 | DRG: 472 | Disposition: A | Discharge status: Home / Self Care | Payer: Medicare Other | Source: Ambulatory Visit | Attending: Neurosurgery | Admitting: Neurosurgery

## 2016-08-05 ENCOUNTER — Inpatient Hospital Stay (HOSPITAL_COMMUNITY): Payer: Medicare Other | Admitting: Certified Registered"

## 2016-08-05 DIAGNOSIS — F329 Major depressive disorder, single episode, unspecified: Secondary | ICD-10-CM | POA: Diagnosis present

## 2016-08-05 DIAGNOSIS — M797 Fibromyalgia: Secondary | ICD-10-CM | POA: Diagnosis present

## 2016-08-05 DIAGNOSIS — Z419 Encounter for procedure for purposes other than remedying health state, unspecified: Secondary | ICD-10-CM

## 2016-08-05 DIAGNOSIS — Z8673 Personal history of transient ischemic attack (TIA), and cerebral infarction without residual deficits: Secondary | ICD-10-CM | POA: Diagnosis not present

## 2016-08-05 DIAGNOSIS — M4802 Spinal stenosis, cervical region: Secondary | ICD-10-CM | POA: Diagnosis not present

## 2016-08-05 DIAGNOSIS — F419 Anxiety disorder, unspecified: Secondary | ICD-10-CM | POA: Diagnosis present

## 2016-08-05 DIAGNOSIS — Z888 Allergy status to other drugs, medicaments and biological substances status: Secondary | ICD-10-CM

## 2016-08-05 DIAGNOSIS — M4322 Fusion of spine, cervical region: Secondary | ICD-10-CM | POA: Diagnosis not present

## 2016-08-05 DIAGNOSIS — Z79899 Other long term (current) drug therapy: Secondary | ICD-10-CM | POA: Diagnosis not present

## 2016-08-05 DIAGNOSIS — Z79891 Long term (current) use of opiate analgesic: Secondary | ICD-10-CM

## 2016-08-05 DIAGNOSIS — G4730 Sleep apnea, unspecified: Secondary | ICD-10-CM | POA: Diagnosis not present

## 2016-08-05 DIAGNOSIS — Z886 Allergy status to analgesic agent status: Secondary | ICD-10-CM

## 2016-08-05 DIAGNOSIS — Z7951 Long term (current) use of inhaled steroids: Secondary | ICD-10-CM

## 2016-08-05 DIAGNOSIS — F17210 Nicotine dependence, cigarettes, uncomplicated: Secondary | ICD-10-CM | POA: Diagnosis present

## 2016-08-05 DIAGNOSIS — Z885 Allergy status to narcotic agent status: Secondary | ICD-10-CM | POA: Diagnosis not present

## 2016-08-05 DIAGNOSIS — J449 Chronic obstructive pulmonary disease, unspecified: Secondary | ICD-10-CM | POA: Diagnosis present

## 2016-08-05 DIAGNOSIS — M47892 Other spondylosis, cervical region: Secondary | ICD-10-CM | POA: Diagnosis present

## 2016-08-05 DIAGNOSIS — I10 Essential (primary) hypertension: Secondary | ICD-10-CM | POA: Diagnosis not present

## 2016-08-05 DIAGNOSIS — M4712 Other spondylosis with myelopathy, cervical region: Secondary | ICD-10-CM | POA: Diagnosis present

## 2016-08-05 HISTORY — PX: ANTERIOR CERVICAL DECOMP/DISCECTOMY FUSION: SHX1161

## 2016-08-05 SURGERY — ANTERIOR CERVICAL DECOMPRESSION/DISCECTOMY FUSION 3 LEVELS
Anesthesia: General | Site: Neck

## 2016-08-05 MED ORDER — THROMBIN 5000 UNITS EX SOLR
OROMUCOSAL | Status: DC | PRN
  Administered 2016-08-05: 5 mL via TOPICAL

## 2016-08-05 MED ORDER — SCOPOLAMINE 1 MG/3DAYS TD PT72
MEDICATED_PATCH | TRANSDERMAL | Status: AC
  Filled 2016-08-05: qty 1

## 2016-08-05 MED ORDER — CHLORHEXIDINE GLUCONATE CLOTH 2 % EX PADS: 6.0000 | MEDICATED_PAD | Freq: Once | CUTANEOUS | Status: DC

## 2016-08-05 MED ORDER — DIPHENHYDRAMINE HCL 50 MG/ML IJ SOLN
INTRAMUSCULAR | Status: DC | PRN
  Administered 2016-08-05: 12.5 mg via INTRAVENOUS

## 2016-08-05 MED ORDER — ACETAMINOPHEN 650 MG RE SUPP: 650.0000 mg | RECTAL | Status: DC | PRN

## 2016-08-05 MED ORDER — PHENOL 1.4 % MT LIQD
1.0000 | OROMUCOSAL | Status: DC | PRN
  Administered 2016-08-05: 1 via OROMUCOSAL
  Filled 2016-08-05 (×2): qty 177

## 2016-08-05 MED ORDER — OXYCODONE-ACETAMINOPHEN 5-325 MG PO TABS
1.0000 | ORAL_TABLET | ORAL | Status: DC | PRN
  Administered 2016-08-05 – 2016-08-06 (×5): 2 via ORAL
  Filled 2016-08-05 (×4): qty 2

## 2016-08-05 MED ORDER — HYDROMORPHONE HCL 1 MG/ML IJ SOLN
INTRAMUSCULAR | Status: AC
  Filled 2016-08-05: qty 1

## 2016-08-05 MED ORDER — LIDOCAINE HCL (CARDIAC) 20 MG/ML IV SOLN
INTRAVENOUS | Status: DC | PRN
  Administered 2016-08-05: 30 mg via INTRAVENOUS

## 2016-08-05 MED ORDER — ONDANSETRON HCL 4 MG/2ML IJ SOLN
INTRAMUSCULAR | Status: DC | PRN
  Administered 2016-08-05: 4 mg via INTRAVENOUS

## 2016-08-05 MED ORDER — HYDROMORPHONE HCL 1 MG/ML IJ SOLN: 0.5000 mg | INTRAMUSCULAR | Status: DC | PRN

## 2016-08-05 MED ORDER — ALBUTEROL SULFATE HFA 108 (90 BASE) MCG/ACT IN AERS
INHALATION_SPRAY | RESPIRATORY_TRACT | Status: AC
  Filled 2016-08-05: qty 6.7

## 2016-08-05 MED ORDER — ALBUTEROL SULFATE (2.5 MG/3ML) 0.083% IN NEBU: 3.0000 mL | INHALATION_SOLUTION | Freq: Four times a day (QID) | RESPIRATORY_TRACT | Status: DC | PRN

## 2016-08-05 MED ORDER — LIDOCAINE 5 % EX PTCH
1.0000 | MEDICATED_PATCH | Freq: Every day | CUTANEOUS | Status: DC | PRN
  Filled 2016-08-05: qty 1

## 2016-08-05 MED ORDER — MIDAZOLAM HCL 5 MG/5ML IJ SOLN
INTRAMUSCULAR | Status: DC | PRN
  Administered 2016-08-05: 2 mg via INTRAVENOUS

## 2016-08-05 MED ORDER — PROMETHAZINE HCL 25 MG/ML IJ SOLN
6.2500 mg | INTRAMUSCULAR | Status: DC | PRN
  Administered 2016-08-05: 6.25 mg via INTRAVENOUS

## 2016-08-05 MED ORDER — LACTATED RINGERS IV SOLN
Freq: Once | INTRAVENOUS | Status: AC
  Administered 2016-08-05: 08:00:00 via INTRAVENOUS

## 2016-08-05 MED ORDER — MIDAZOLAM HCL 2 MG/2ML IJ SOLN: 0.5000 mg | Freq: Once | INTRAMUSCULAR | Status: DC | PRN

## 2016-08-05 MED ORDER — PROMETHAZINE HCL 25 MG/ML IJ SOLN
INTRAMUSCULAR | Status: AC
  Filled 2016-08-05: qty 1

## 2016-08-05 MED ORDER — OXYCODONE-ACETAMINOPHEN 5-325 MG PO TABS
ORAL_TABLET | ORAL | Status: AC
  Filled 2016-08-05: qty 2

## 2016-08-05 MED ORDER — ONDANSETRON HCL 4 MG/2ML IJ SOLN
4.0000 mg | INTRAMUSCULAR | Status: DC | PRN
Start: 2016-08-05 — End: 2016-08-06

## 2016-08-05 MED ORDER — MENTHOL 3 MG MT LOZG
1.0000 | LOZENGE | OROMUCOSAL | Status: DC | PRN
Start: 2016-08-05 — End: 2016-08-06

## 2016-08-05 MED ORDER — SODIUM CHLORIDE 0.9% FLUSH: 3.0000 mL | INTRAVENOUS | Status: DC | PRN

## 2016-08-05 MED ORDER — HYDROCODONE-ACETAMINOPHEN 5-325 MG PO TABS: 1.0000 | ORAL_TABLET | ORAL | Status: DC | PRN

## 2016-08-05 MED ORDER — ROCURONIUM BROMIDE 50 MG/5ML IV SOSY
PREFILLED_SYRINGE | INTRAVENOUS | Status: AC
  Filled 2016-08-05: qty 5

## 2016-08-05 MED ORDER — CEFAZOLIN IN D5W 1 GM/50ML IV SOLN
1.0000 g | Freq: Three times a day (TID) | INTRAVENOUS | Status: AC
  Administered 2016-08-05 (×2): 1 g via INTRAVENOUS
  Filled 2016-08-05 (×2): qty 50

## 2016-08-05 MED ORDER — BACITRACIN ZINC 500 UNIT/GM EX OINT
TOPICAL_OINTMENT | CUTANEOUS | Status: AC
  Filled 2016-08-05: qty 28.35

## 2016-08-05 MED ORDER — SCOPOLAMINE 1 MG/3DAYS TD PT72
MEDICATED_PATCH | TRANSDERMAL | Status: DC | PRN
  Administered 2016-08-05: 1 via TRANSDERMAL

## 2016-08-05 MED ORDER — TIOTROPIUM BROMIDE MONOHYDRATE 18 MCG IN CAPS
18.0000 ug | ORAL_CAPSULE | Freq: Every day | RESPIRATORY_TRACT | Status: DC
  Filled 2016-08-05: qty 5

## 2016-08-05 MED ORDER — CEFAZOLIN SODIUM-DEXTROSE 2-4 GM/100ML-% IV SOLN
2.0000 g | INTRAVENOUS | Status: AC
  Administered 2016-08-05: 2 g via INTRAVENOUS
  Filled 2016-08-05: qty 100

## 2016-08-05 MED ORDER — FENTANYL CITRATE (PF) 100 MCG/2ML IJ SOLN
INTRAMUSCULAR | Status: AC
  Filled 2016-08-05: qty 4

## 2016-08-05 MED ORDER — METOCLOPRAMIDE HCL 5 MG/ML IJ SOLN
INTRAMUSCULAR | Status: AC
  Filled 2016-08-05: qty 2

## 2016-08-05 MED ORDER — METOCLOPRAMIDE HCL 5 MG/ML IJ SOLN
INTRAMUSCULAR | Status: DC | PRN
  Administered 2016-08-05: 5 mg via INTRAVENOUS

## 2016-08-05 MED ORDER — THROMBIN 5000 UNITS EX SOLR
CUTANEOUS | Status: AC
  Filled 2016-08-05: qty 5000

## 2016-08-05 MED ORDER — MIDAZOLAM HCL 2 MG/2ML IJ SOLN
INTRAMUSCULAR | Status: AC
  Filled 2016-08-05: qty 2

## 2016-08-05 MED ORDER — PROPOFOL 10 MG/ML IV BOLUS
INTRAVENOUS | Status: AC
  Filled 2016-08-05: qty 20

## 2016-08-05 MED ORDER — LACTATED RINGERS IV SOLN
INTRAVENOUS | Status: DC | PRN
  Administered 2016-08-05 (×2): via INTRAVENOUS

## 2016-08-05 MED ORDER — THROMBIN 20000 UNITS EX SOLR
CUTANEOUS | Status: DC | PRN
  Administered 2016-08-05: 20 mL via TOPICAL

## 2016-08-05 MED ORDER — SODIUM CHLORIDE 0.9% FLUSH
3.0000 mL | Freq: Two times a day (BID) | INTRAVENOUS | Status: DC
  Administered 2016-08-05: 3 mL via INTRAVENOUS

## 2016-08-05 MED ORDER — PHENYLEPHRINE HCL 10 MG/ML IJ SOLN
INTRAMUSCULAR | Status: DC | PRN
  Administered 2016-08-05: 25 ug/min via INTRAVENOUS

## 2016-08-05 MED ORDER — SUGAMMADEX SODIUM 200 MG/2ML IV SOLN
INTRAVENOUS | Status: AC
  Filled 2016-08-05: qty 2

## 2016-08-05 MED ORDER — SUGAMMADEX SODIUM 200 MG/2ML IV SOLN
INTRAVENOUS | Status: DC | PRN
  Administered 2016-08-05: 150 mg via INTRAVENOUS

## 2016-08-05 MED ORDER — GLYCOPYRROLATE 0.2 MG/ML IJ SOLN
INTRAMUSCULAR | Status: DC | PRN
  Administered 2016-08-05 (×2): 0.1 mg via INTRAVENOUS

## 2016-08-05 MED ORDER — LIDOCAINE 2% (20 MG/ML) 5 ML SYRINGE
INTRAMUSCULAR | Status: AC
  Filled 2016-08-05: qty 10

## 2016-08-05 MED ORDER — BACITRACIN 50000 UNITS IM SOLR
INTRAMUSCULAR | Status: DC | PRN
  Administered 2016-08-05: 500 mL

## 2016-08-05 MED ORDER — DIPHENHYDRAMINE HCL 50 MG/ML IJ SOLN
INTRAMUSCULAR | Status: AC
  Filled 2016-08-05: qty 1

## 2016-08-05 MED ORDER — 0.9 % SODIUM CHLORIDE (POUR BTL) OPTIME
TOPICAL | Status: DC | PRN
  Administered 2016-08-05: 1000 mL

## 2016-08-05 MED ORDER — PROPOFOL 10 MG/ML IV BOLUS
INTRAVENOUS | Status: DC | PRN
  Administered 2016-08-05: 100 mg via INTRAVENOUS

## 2016-08-05 MED ORDER — ROCURONIUM BROMIDE 100 MG/10ML IV SOLN
INTRAVENOUS | Status: DC | PRN
  Administered 2016-08-05: 50 mg via INTRAVENOUS

## 2016-08-05 MED ORDER — MEPERIDINE HCL 25 MG/ML IJ SOLN: 6.2500 mg | INTRAMUSCULAR | Status: DC | PRN

## 2016-08-05 MED ORDER — HYDROMORPHONE HCL 1 MG/ML IJ SOLN
0.2500 mg | INTRAMUSCULAR | Status: DC | PRN
  Administered 2016-08-05 (×4): 0.5 mg via INTRAVENOUS

## 2016-08-05 MED ORDER — CYCLOBENZAPRINE HCL 10 MG PO TABS
ORAL_TABLET | ORAL | Status: AC
  Filled 2016-08-05: qty 1

## 2016-08-05 MED ORDER — FENTANYL CITRATE (PF) 100 MCG/2ML IJ SOLN
INTRAMUSCULAR | Status: DC | PRN
  Administered 2016-08-05: 25 ug via INTRAVENOUS
  Administered 2016-08-05: 50 ug via INTRAVENOUS
  Administered 2016-08-05: 25 ug via INTRAVENOUS
  Administered 2016-08-05: 100 ug via INTRAVENOUS

## 2016-08-05 MED ORDER — ONDANSETRON HCL 4 MG/2ML IJ SOLN
INTRAMUSCULAR | Status: AC
  Filled 2016-08-05: qty 2

## 2016-08-05 MED ORDER — ACETAMINOPHEN 325 MG PO TABS
650.0000 mg | ORAL_TABLET | ORAL | Status: DC | PRN
Start: 2016-08-05 — End: 2016-08-06

## 2016-08-05 MED ORDER — CYCLOBENZAPRINE HCL 10 MG PO TABS
10.0000 mg | ORAL_TABLET | Freq: Three times a day (TID) | ORAL | Status: DC | PRN
  Administered 2016-08-05 (×2): 10 mg via ORAL
  Filled 2016-08-05: qty 1

## 2016-08-05 MED ORDER — BISOPROLOL-HYDROCHLOROTHIAZIDE 10-6.25 MG PO TABS
1.0000 | ORAL_TABLET | Freq: Every day | ORAL | Status: DC
  Administered 2016-08-06: 1 via ORAL
  Filled 2016-08-05: qty 1

## 2016-08-05 MED ORDER — THROMBIN 20000 UNITS EX SOLR
CUTANEOUS | Status: AC
  Filled 2016-08-05: qty 20000

## 2016-08-05 SURGICAL SUPPLY — 57 items
APL SKNCLS STERI-STRIP NONHPOA (GAUZE/BANDAGES/DRESSINGS) ×1
BAG DECANTER FOR FLEXI CONT (MISCELLANEOUS) ×2 IMPLANT
BENZOIN TINCTURE PRP APPL 2/3 (GAUZE/BANDAGES/DRESSINGS) ×2 IMPLANT
BIT DRILL 13 (BIT) ×1 IMPLANT
BUR MATCHSTICK NEURO 3.0 LAGG (BURR) ×2 IMPLANT
CAGE PEEK 5X14X11 (Cage) ×4 IMPLANT
CANISTER SUCT 3000ML PPV (MISCELLANEOUS) ×2 IMPLANT
CARTRIDGE OIL MAESTRO DRILL (MISCELLANEOUS) ×1 IMPLANT
DIFFUSER DRILL AIR PNEUMATIC (MISCELLANEOUS) ×2 IMPLANT
DRAPE C-ARM 42X72 X-RAY (DRAPES) ×4 IMPLANT
DRAPE LAPAROTOMY 100X72 PEDS (DRAPES) ×2 IMPLANT
DRAPE MICROSCOPE LEICA (MISCELLANEOUS) ×2 IMPLANT
DRAPE POUCH INSTRU U-SHP 10X18 (DRAPES) ×2 IMPLANT
DURAPREP 6ML APPLICATOR 50/CS (WOUND CARE) ×2 IMPLANT
ELECT COATED BLADE 2.86 ST (ELECTRODE) ×2 IMPLANT
ELECT REM PT RETURN 9FT ADLT (ELECTROSURGICAL) ×2
ELECTRODE REM PT RTRN 9FT ADLT (ELECTROSURGICAL) ×1 IMPLANT
GAUZE SPONGE 4X4 12PLY STRL (GAUZE/BANDAGES/DRESSINGS) ×2 IMPLANT
GAUZE SPONGE 4X4 16PLY XRAY LF (GAUZE/BANDAGES/DRESSINGS) IMPLANT
GLOVE BIOGEL PI IND STRL 6.5 (GLOVE) IMPLANT
GLOVE BIOGEL PI IND STRL 7.5 (GLOVE) IMPLANT
GLOVE BIOGEL PI INDICATOR 6.5 (GLOVE) ×1
GLOVE BIOGEL PI INDICATOR 7.5 (GLOVE) ×2
GLOVE ECLIPSE 7.0 STRL STRAW (GLOVE) ×1 IMPLANT
GLOVE ECLIPSE 9.0 STRL (GLOVE) ×2 IMPLANT
GLOVE SURG SS PI 6.5 STRL IVOR (GLOVE) ×1 IMPLANT
GOWN STRL REUS W/ TWL LRG LVL3 (GOWN DISPOSABLE) IMPLANT
GOWN STRL REUS W/ TWL XL LVL3 (GOWN DISPOSABLE) IMPLANT
GOWN STRL REUS W/TWL LRG LVL3 (GOWN DISPOSABLE) ×4
GOWN STRL REUS W/TWL XL LVL3 (GOWN DISPOSABLE) ×4
HALTER HD/CHIN CERV TRACTION D (MISCELLANEOUS) ×2 IMPLANT
HEMOSTAT POWDER KIT SURGIFOAM (HEMOSTASIS) ×1 IMPLANT
KIT BASIN OR (CUSTOM PROCEDURE TRAY) ×2 IMPLANT
KIT ROOM TURNOVER OR (KITS) ×2 IMPLANT
NDL SPNL 20GX3.5 QUINCKE YW (NEEDLE) ×1 IMPLANT
NEEDLE SPNL 20GX3.5 QUINCKE YW (NEEDLE) ×2 IMPLANT
NS IRRIG 1000ML POUR BTL (IV SOLUTION) ×2 IMPLANT
OIL CARTRIDGE MAESTRO DRILL (MISCELLANEOUS) ×2
PACK LAMINECTOMY NEURO (CUSTOM PROCEDURE TRAY) ×2 IMPLANT
PAD ARMBOARD 7.5X6 YLW CONV (MISCELLANEOUS) ×6 IMPLANT
PLATE 3 55XLCK NS SPNE CVD (Plate) IMPLANT
PLATE 3 ATLANTIS TRANS (Plate) ×2 IMPLANT
RUBBERBAND STERILE (MISCELLANEOUS) ×4 IMPLANT
SCREW ST FIX 4 ATL 3120213 (Screw) ×8 IMPLANT
SPACER SPNL 11X14X5XPEEK CVD (Cage) IMPLANT
SPCR SPNL 11X14X5XPEEK CVD (Cage) ×1 IMPLANT
SPONGE GAUZE 4X4 12PLY STER LF (GAUZE/BANDAGES/DRESSINGS) ×1 IMPLANT
SPONGE INTESTINAL PEANUT (DISPOSABLE) ×2 IMPLANT
SPONGE SURGIFOAM ABS GEL 100 (HEMOSTASIS) ×2 IMPLANT
STRIP CLOSURE SKIN 1/2X4 (GAUZE/BANDAGES/DRESSINGS) ×2 IMPLANT
SUT VIC AB 3-0 SH 8-18 (SUTURE) ×2 IMPLANT
SUT VIC AB 4-0 RB1 18 (SUTURE) ×2 IMPLANT
TAPE CLOTH SURG 4X10 WHT LF (GAUZE/BANDAGES/DRESSINGS) ×1 IMPLANT
TOWEL OR 17X24 6PK STRL BLUE (TOWEL DISPOSABLE) ×2 IMPLANT
TOWEL OR 17X26 10 PK STRL BLUE (TOWEL DISPOSABLE) ×2 IMPLANT
TRAP SPECIMEN MUCOUS 40CC (MISCELLANEOUS) ×1 IMPLANT
WATER STERILE IRR 1000ML POUR (IV SOLUTION) ×2 IMPLANT

## 2016-08-05 NOTE — Anesthesia Postprocedure Evaluation (Signed)
Anesthesia Post Note  Patient: MILAROSE GILSTER  Procedure(s) Performed: Procedure(s) (LRB): ANTERIOR CERVICAL DECOMPRESSION/DISCECTOMY FUSION  - CERVICAL THREE-FOUR, CERRVICAL FOUR-FIVE, CERVICAL FIVE-SIX (N/A)  Patient location during evaluation: PACU Anesthesia Type: General Level of consciousness: awake and alert, patient cooperative and oriented Pain management: pain level controlled Vital Signs Assessment: post-procedure vital signs reviewed and stable Respiratory status: spontaneous breathing, nonlabored ventilation, respiratory function stable and patient connected to nasal cannula oxygen Cardiovascular status: blood pressure returned to baseline and stable Postop Assessment: no signs of nausea or vomiting Anesthetic complications: no       Last Vitals:  Vitals:   08/05/16 1130 08/05/16 1150  BP:  135/79  Pulse:  70  Resp:  18  Temp: 36.4 C 36.8 C    Last Pain:  Vitals:   08/05/16 1130  TempSrc:   PainSc: Asleep                 Zaevion Parke,E. Shinita Mac

## 2016-08-05 NOTE — Op Note (Signed)
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  Surgeon:Karyl Sharrar A.Murel Shenberger, M.D.  Asst. Surgeon: Kathyrn Sheriff  Anesthesia: General  Indication: 66 year old female with neck and bilateral upper extremity pain and weakness. Workup demonstrates evidence of marked spondylosis with stenosis and spinal cord signal abnormality. Patient has failed conservative management and presents now for three-level anterior cervical decompression infusion in hopes of improving her symptoms.  Operative note: After induction of anesthesia, patient position supine with neck slightly extended and held place with halter traction. Anterior cervical region prepped and draped sterilely. Incision made overlying C4-5. Dissection performed the right side. Retractor placed. Fluoroscopy used. Levels confirmed. Disc spaces incised at all 3 levels. Discectomies performed using various instruments down to level posterior annulus. Microscope brought into field used throughout the remainder of the discectomy. Remaining aspects of annulus and osteophytes removed using high-speed drill down to level posterior lunch Ligament. Posterior longitudinal was elevated and resected in piecemeal fashion. Underlying thecal sac was then identified. Wide central decompression was then performed by undercutting the bodies of C3 and C4. Decompression then proceeded into the neural foramen. Wide anterior foraminotomies were performed on the course exiting C4 nerve roots bilaterally. At this point a very thorough decompression had been achieved. There was no evidence of injury to the thecal sac or nerve roots. Procedure then repeated at  C4-5 and C5-6 again without complications. Wound is then irrigated with and like solution. 5 mm Medtronic anatomic peek cages were packed with locally harvested autograft. Each cage was then impacted into place and recessed slightly from the anterior cortical margin at all 3 levels. Medtronic Atlantis translational plate was then placed from C3-C6. This is an attachment or fluoroscopic guidance using 13 motor fixed angle screws at all 4 levels. All 8 screws were given a final tightening and found to be solidly within the bone. Locking screws and gauge at all levels. Final images revealed good position of the cages and bone graft and instrumentation at the proper upper level with normal lamina spine. Wounds and irrigated one final time. Hemostasis was assured with bipolar cautery. Wounds and close in layers of Vicryl sutures. Steri-Strips and sterile dressing were applied. No apparent complications. Patient tolerated procedure well and she returns to the recovery room postop.

## 2016-08-05 NOTE — H&P (Signed)
Brandi Bean is an 66 y.o. female.   Chief Complaint: Neck pain and weakness HPI: 66 year old female with progressive neck pain and bilateral upper extremity numbness and weakness. Workup demonstrates evidence of marked spondylosis with stenosis and associated spinal cord signal abnormality at C3-4, C4-5 and C5-6. Patient presents now for three-level decompression in hopes of improving her symptoms.  Past Medical History:  Diagnosis Date  . Anxiety   . Arthritis   . Back pain   . COPD (chronic obstructive pulmonary disease) ([place])   . Depression   . Dyspnea    W/ EXERTION+   WHEEZING  . Fibromyalgia   . Headache   . Hypertension   . PONV (postoperative nausea and vomiting)   . Sleep apnea    ?   MILD NO MACHINE ORDERED, TO BE RETESTED AT HOME AFTER SURGERY  . Stroke [facility])    MINI STROKE   15-20 YRS AGO  . UTI (urinary tract infection)     Past Surgical History:  Procedure Laterality Date  . ABDOMINAL HYSTERECTOMY     BLADDER TACK  . CHOLECYSTECTOMY    . COLONOSCOPY N/A 02/14/2014   Procedure: COLONOSCOPY;  Surgeon: Danie Binder, MD;  Location: [facility];  Service: Endoscopy;  Laterality: N/A;  10:00 AM-moved to [place] to notify pt  . HAND SURGERY     RIGHT  . POLYPECTOMY  02/14/2014   Procedure: POLYPECTOMY;  Surgeon: Danie Binder, MD;  Location: [facility];  Service: Endoscopy;;  Sigmoid colon    History reviewed. No pertinent family history. Social History:  reports that she has been smoking Cigarettes.  She has a 30.00 pack-year smoking history. She has never used smokeless tobacco. She reports that she does not drink alcohol or use drugs.  Allergies:  Allergies  Allergen Reactions  . Codeine Sulfate Itching  . Hydrocodone   . Prednisone     Patient can't remember reaction.    Medications Prior to Admission  Medication Sig Dispense Refill  . albuterol (PROVENTIL HFA;VENTOLIN HFA) 108 (90 BASE) MCG/ACT inhaler Inhale 2 puffs into the lungs  every 6 (six) hours as needed for wheezing or shortness of breath.     . bisoprolol-hydrochlorothiazide (ZIAC) 10-6.25 MG per tablet Take 1 tablet by mouth daily.      Marland Kitchen lidocaine (LIDODERM) 5 % Place 1 patch onto the skin daily as needed (pain). Remove & Discard patch within 12 hours or as directed by MD    . oxyCODONE-acetaminophen (PERCOCET) 10-325 MG tablet Take 1 tablet by mouth every 6 (six) hours as needed for pain.    Marland Kitchen tiotropium (SPIRIVA HANDIHALER) 18 MCG inhalation capsule USE ONE PUFF ONCE DAILY. (Patient taking differently: Place 18 mcg into inhaler and inhale daily. USE ONE PUFF ONCE DAILY.) 30 capsule 11    No results found for this or any previous visit (from the past 48 hour(s)). No results found.  Pertinent items noted in HPI and remainder of comprehensive ROS otherwise negative.  Blood pressure 127/70, pulse 64, temperature 98.4 F (36.9 C), temperature source Oral, resp. rate 18, SpO2 97 %.  Patient is awake and alert. She is oriented and appropriate. Her speech is fluent. Judgment and insight are intact. Cranial nerve function intact bilaterally. Motor examination with mild weakness of both grips and intrinsics bilaterally. Sensory examination with patchy sensory loss in both upper extremities but no focal dermatomal sensory loss. Patient with hospitalization ounces in both hands. Gait mildly spastic. Examination head ears  eyes and throat site marked. Chest and abdomen are benign. Neck with a normal airway and normal carotid pulses bilaterally. Assessment/Plan C3-4, C4-5, C5-6 stenosis with myelopathy. Plan C3-4, C4-5, C5-6 anterior cervical discectomy with interbody fusion utilizing interbody peek cages, locally harvested autograft, and anterior plate instrumentation. Risks and benefits been explained. Patient wishes to proceed.  [place] Huberty A 08/05/2016, 7:48 AM

## 2016-08-05 NOTE — Brief Op Note (Signed)
08/05/2016  10:10 AM  PATIENT:  Brandi Bean  66 y.o. female  PRE-OPERATIVE DIAGNOSIS:  Stenosis cervical  POST-OPERATIVE DIAGNOSIS:  Stenosis cervical  PROCEDURE:  Procedure(s): ANTERIOR CERVICAL DECOMPRESSION/DISCECTOMY FUSION  - CERVICAL THREE-FOUR, CERRVICAL FOUR-FIVE, CERVICAL FIVE-SIX (N/A)  SURGEON:  Surgeon(s) and Role:    * Earnie Larsson, MD - Primary    * Consuella Lose, MD - Assisting  PHYSICIAN ASSISTANT:   ASSISTANTS:    ANESTHESIA:   general  EBL:  Total I/O In: 1000 [I.V.:1000] Out: 325 [Urine:125; Blood:200]  BLOOD ADMINISTERED:none  DRAINS: none   LOCAL MEDICATIONS USED:  NONE  SPECIMEN:  No Specimen  DISPOSITION OF SPECIMEN:  N/A  COUNTS:  YES  TOURNIQUET:  * No tourniquets in log *  DICTATION: .Dragon Dictation  PLAN OF CARE: Admit to inpatient   PATIENT DISPOSITION:  PACU - hemodynamically stable.   Delay start of Pharmacological VTE agent (>24hrs) due to surgical blood loss or risk of bleeding: yes

## 2016-08-05 NOTE — Anesthesia Procedure Notes (Signed)
Procedure Name: Intubation Date/Time: 08/05/2016 8:15 AM Performed by: Valda Favia Pre-anesthesia Checklist: Patient identified, Emergency Drugs available, Suction available, Patient being monitored and Timeout performed Patient Re-evaluated:Patient Re-evaluated prior to inductionOxygen Delivery Method: Circle system utilized Preoxygenation: Pre-oxygenation with 100% oxygen Intubation Type: IV induction Ventilation: Mask ventilation without difficulty Laryngoscope Size: Mac and 3 Grade View: Grade I Tube type: Oral Tube size: 7.0 mm Number of attempts: 1 Airway Equipment and Method: Stylet and LTA kit utilized Placement Confirmation: ETT inserted through vocal cords under direct vision,  positive ETCO2 and breath sounds checked- equal and bilateral Secured at: 21 cm Tube secured with: Tape Dental Injury: Teeth and Oropharynx as per pre-operative assessment

## 2016-08-05 NOTE — Anesthesia Preprocedure Evaluation (Signed)
Anesthesia Evaluation  Patient identified by MRN, date of birth, ID band Patient awake    Reviewed: Allergy & Precautions, NPO status , Patient's Chart, lab work & pertinent test results, reviewed documented beta blocker date and time   History of Anesthesia Complications Negative for: history of anesthetic complications  Airway Mallampati: I  TM Distance: >3 FB Neck ROM: Full    Dental  (+) Missing, Dental Advisory Given, Chipped   Pulmonary COPD, Current Smoker,    breath sounds clear to auscultation       Cardiovascular hypertension, Pt. on medications and Pt. on home beta blockers (-) angina Rhythm:Regular Rate:Normal     Neuro/Psych Anxiety Depression TIA   GI/Hepatic negative GI ROS, Neg liver ROS,   Endo/Other  negative endocrine ROS  Renal/GU negative Renal ROS     Musculoskeletal  (+) Arthritis , Fibromyalgia -  Abdominal   Peds  Hematology negative hematology ROS (+)   Anesthesia Other Findings   Reproductive/Obstetrics                             Anesthesia Physical Anesthesia Plan  ASA: II  Anesthesia Plan: General   Post-op Pain Management:    Induction: Intravenous  Airway Management Planned: Oral ETT  Additional Equipment:   Intra-op Plan:   Post-operative Plan: Extubation in OR  Informed Consent: I have reviewed the patients History and Physical, chart, labs and discussed the procedure including the risks, benefits and alternatives for the proposed anesthesia with the patient or authorized representative who has indicated his/her understanding and acceptance.   Dental advisory given  Plan Discussed with: CRNA and Surgeon  Anesthesia Plan Comments: (Plan routine monitors, GETA)        Anesthesia Quick Evaluation

## 2016-08-05 NOTE — Transfer of Care (Signed)
Immediate Anesthesia Transfer of Care Note  Patient: Brandi Bean  Procedure(s) Performed: Procedure(s): ANTERIOR CERVICAL DECOMPRESSION/DISCECTOMY FUSION  - CERVICAL THREE-FOUR, CERRVICAL FOUR-FIVE, CERVICAL FIVE-SIX (N/A)  Patient Location: PACU  Anesthesia Type:General  Level of Consciousness: awake and alert   Airway & Oxygen Therapy: Patient Spontanous Breathing and Patient connected to nasal cannula oxygen  Post-op Assessment: Report given to RN and Post -op Vital signs reviewed and stable  Post vital signs: Reviewed and stable  Last Vitals:  Vitals:   08/05/16 0630 08/05/16 1030  BP: 127/70   Pulse: 64   Resp: 18   Temp: 36.9 C 36.4 C    Last Pain:  Vitals:   08/05/16 1030  TempSrc:   PainSc: Asleep      Patients Stated Pain Goal: 3 ([id number])  Complications: No apparent anesthesia complications

## 2016-08-06 ENCOUNTER — Encounter (HOSPITAL_COMMUNITY): Payer: Self-pay | Admitting: Neurosurgery

## 2016-08-06 MED ORDER — OXYCODONE-ACETAMINOPHEN 10-325 MG PO TABS: 1.0000 | ORAL_TABLET | ORAL | 0 refills | Status: DC | PRN

## 2016-08-06 MED ORDER — CYCLOBENZAPRINE HCL 10 MG PO TABS: 10.0000 mg | ORAL_TABLET | Freq: Three times a day (TID) | ORAL | 0 refills | Status: DC | PRN

## 2016-08-06 NOTE — Discharge Instructions (Signed)

## 2016-08-06 NOTE — Discharge Summary (Signed)
Physician Discharge Summary  Patient ID: Brandi Bean MRN: [id number] DOB/AGE: 02-Jul-1951 66 y.o.  Admit date: 08/05/2016 Discharge date: 08/06/2016  Admission Diagnoses:  Discharge Diagnoses:  Active Problems:   Cervical stenosis of spinal canal   Discharged Condition: good  Hospital Course: Patient admitted to the hospital where she underwent an uncomplicated three-level anterior cervical decompression and fusion. Postoperatively she is doing well. Upper extremity function and pain much improved. Swallowing well. Ambulating without difficulty. Ready for discharge home.  Consults:   Significant Diagnostic Studies:   Treatments:   Discharge Exam: Blood pressure (!) 96/56, pulse 70, temperature 98.9 F (37.2 C), temperature source Oral, resp. rate 18, SpO2 97 %. Awake and alert. Oriented and appropriate. Cranial nerve function normal. Motor and sensory examination are intact bilaterally. Wound clean and dry. Next soft. Chest and abdomen benign.  Disposition: 01-Home or Self Care   Allergies as of 08/06/2016      Reactions   Codeine Sulfate Itching   Hydrocodone    Prednisone    Patient can't remember reaction.      Medication List    TAKE these medications   albuterol 108 (90 Base) MCG/ACT inhaler Commonly known as:  PROVENTIL HFA;VENTOLIN HFA Inhale 2 puffs into the lungs every 6 (six) hours as needed for wheezing or shortness of breath.   bisoprolol-hydrochlorothiazide 10-6.25 MG tablet Commonly known as:  ZIAC Take 1 tablet by mouth daily.   cyclobenzaprine 10 MG tablet Commonly known as:  FLEXERIL Take 1 tablet (10 mg total) by mouth 3 (three) times daily as needed for muscle spasms.   lidocaine 5 % Commonly known as:  LIDODERM Place 1 patch onto the skin daily as needed (pain). Remove & Discard patch within 12 hours or as directed by MD   oxyCODONE-acetaminophen 10-325 MG tablet Commonly known as:  PERCOCET Take 1-2 tablets by mouth every 4 (four) hours  as needed for pain. What changed:  how much to take  when to take this   tiotropium 18 MCG inhalation capsule Commonly known as:  [place]. What changed:  how much to take  how to take this  when to take this  additional instructions        Signed: Florenda Watt A 08/06/2016, 7:54 AM

## 2016-08-06 NOTE — Progress Notes (Signed)
Patient alert and oriented, mae's well, voiding adequate amount of urine, swallowing without difficulty, no c/o pain. Patient discharged home with family. Script and discharged instructions given to patient. Patient and family stated understanding of d/c instructions given and has an appointment with MD. 

## 2016-08-26 DIAGNOSIS — M4802 Spinal stenosis, cervical region: Secondary | ICD-10-CM | POA: Diagnosis not present

## 2016-08-26 DIAGNOSIS — Z6821 Body mass index (BMI) 21.0-21.9, adult: Secondary | ICD-10-CM | POA: Diagnosis not present

## 2016-08-26 DIAGNOSIS — Z720 Tobacco use: Secondary | ICD-10-CM | POA: Diagnosis not present

## 2016-08-26 DIAGNOSIS — N342 Other urethritis: Secondary | ICD-10-CM | POA: Diagnosis not present

## 2016-08-26 DIAGNOSIS — Z1389 Encounter for screening for other disorder: Secondary | ICD-10-CM | POA: Diagnosis not present

## 2016-08-26 DIAGNOSIS — J449 Chronic obstructive pulmonary disease, unspecified: Secondary | ICD-10-CM | POA: Diagnosis not present

## 2016-09-04 DIAGNOSIS — M4802 Spinal stenosis, cervical region: Secondary | ICD-10-CM | POA: Diagnosis not present

## 2016-09-12 ENCOUNTER — Other Ambulatory Visit (HOSPITAL_COMMUNITY): Payer: Self-pay | Admitting: Registered Nurse

## 2016-09-12 DIAGNOSIS — E2839 Other primary ovarian failure: Secondary | ICD-10-CM

## 2016-09-18 ENCOUNTER — Ambulatory Visit (HOSPITAL_COMMUNITY)
Admission: RE | Admit: 2016-09-18 | Discharge: 2016-09-18 | Disposition: A | Discharge status: Home / Self Care | Payer: Medicare Other | Source: Ambulatory Visit | Attending: Registered Nurse | Admitting: Registered Nurse

## 2016-09-18 ENCOUNTER — Other Ambulatory Visit (HOSPITAL_COMMUNITY): Payer: Self-pay | Admitting: Internal Medicine

## 2016-09-18 ENCOUNTER — Ambulatory Visit (HOSPITAL_COMMUNITY)
Admission: RE | Admit: 2016-09-18 | Discharge: 2016-09-18 | Disposition: A | Discharge status: Home / Self Care | Payer: Medicare Other | Source: Ambulatory Visit | Attending: Internal Medicine | Admitting: Internal Medicine

## 2016-09-18 DIAGNOSIS — J449 Chronic obstructive pulmonary disease, unspecified: Secondary | ICD-10-CM | POA: Diagnosis not present

## 2016-09-18 DIAGNOSIS — R05 Cough: Secondary | ICD-10-CM | POA: Diagnosis not present

## 2016-09-18 DIAGNOSIS — R059 Cough, unspecified: Secondary | ICD-10-CM

## 2016-09-18 DIAGNOSIS — I700 Atherosclerosis of aorta: Secondary | ICD-10-CM | POA: Insufficient documentation

## 2016-09-18 DIAGNOSIS — E2839 Other primary ovarian failure: Secondary | ICD-10-CM | POA: Diagnosis present

## 2016-09-18 DIAGNOSIS — M810 Age-related osteoporosis without current pathological fracture: Secondary | ICD-10-CM | POA: Diagnosis not present

## 2016-10-02 DIAGNOSIS — M4802 Spinal stenosis, cervical region: Secondary | ICD-10-CM | POA: Diagnosis not present

## 2016-11-26 DIAGNOSIS — H524 Presbyopia: Secondary | ICD-10-CM | POA: Diagnosis not present

## 2016-11-26 DIAGNOSIS — H5203 Hypermetropia, bilateral: Secondary | ICD-10-CM | POA: Diagnosis not present

## 2016-11-26 DIAGNOSIS — H52223 Regular astigmatism, bilateral: Secondary | ICD-10-CM | POA: Diagnosis not present

## 2016-12-11 DIAGNOSIS — I10 Essential (primary) hypertension: Secondary | ICD-10-CM | POA: Diagnosis not present

## 2016-12-11 DIAGNOSIS — M4802 Spinal stenosis, cervical region: Secondary | ICD-10-CM | POA: Diagnosis not present

## 2017-02-06 DIAGNOSIS — Z6823 Body mass index (BMI) 23.0-23.9, adult: Secondary | ICD-10-CM | POA: Diagnosis not present

## 2017-02-06 DIAGNOSIS — I10 Essential (primary) hypertension: Secondary | ICD-10-CM | POA: Diagnosis not present

## 2017-02-06 DIAGNOSIS — M4802 Spinal stenosis, cervical region: Secondary | ICD-10-CM | POA: Diagnosis not present

## 2017-02-06 DIAGNOSIS — M47812 Spondylosis without myelopathy or radiculopathy, cervical region: Secondary | ICD-10-CM | POA: Diagnosis not present

## 2017-02-06 DIAGNOSIS — G894 Chronic pain syndrome: Secondary | ICD-10-CM | POA: Diagnosis not present

## 2017-02-06 DIAGNOSIS — E782 Mixed hyperlipidemia: Secondary | ICD-10-CM | POA: Diagnosis not present

## 2017-02-06 DIAGNOSIS — M2550 Pain in unspecified joint: Secondary | ICD-10-CM | POA: Diagnosis not present

## 2017-02-11 DIAGNOSIS — I10 Essential (primary) hypertension: Secondary | ICD-10-CM | POA: Diagnosis not present

## 2017-02-11 DIAGNOSIS — M4802 Spinal stenosis, cervical region: Secondary | ICD-10-CM | POA: Diagnosis not present

## 2017-02-13 DIAGNOSIS — H04123 Dry eye syndrome of bilateral lacrimal glands: Secondary | ICD-10-CM | POA: Diagnosis not present

## 2017-02-13 DIAGNOSIS — H2513 Age-related nuclear cataract, bilateral: Secondary | ICD-10-CM | POA: Diagnosis not present

## 2017-02-13 DIAGNOSIS — H2511 Age-related nuclear cataract, right eye: Secondary | ICD-10-CM | POA: Diagnosis not present

## 2017-02-17 NOTE — Patient Instructions (Signed)
Your procedure is scheduled on: 02/24/2017   Report to [facility] at  900  AM.  Call this number if you have problems the morning of surgery: [phone]   Do not eat food or drink liquids :After Midnight.      Take these medicines the morning of surgery with A SIP OF WATER: ziac, flexaril, oxycodone. Use your inhalers before you come.   Do not wear jewelry, make-up or nail polish.  Do not wear lotions, powders, or perfumes. You may wear deodorant.  Do not shave 48 hours prior to surgery.  Do not bring valuables to the hospital.  Contacts, dentures or bridgework may not be worn into surgery.  Leave suitcase in the car. After surgery it may be brought to your room.  For patients admitted to the hospital, checkout time is 11:00 AM the day of discharge.   Patients discharged the day of surgery will not be allowed to drive home.  :     Please read over the following fact sheets that you were given: Coughing and Deep Breathing, Surgical Site Infection Prevention, Anesthesia Post-op Instructions and Care and Recovery After Surgery    Cataract A cataract is a clouding of the lens of the eye. When a lens becomes cloudy, vision is reduced based on the degree and nature of the clouding. Many cataracts reduce vision to some degree. Some cataracts make people more near-sighted as they develop. Other cataracts increase glare. Cataracts that are ignored and become worse can sometimes look white. The white color can be seen through the pupil. CAUSES   Aging. However, cataracts may occur at any age, even in newborns.   Certain drugs.   Trauma to the eye.   Certain diseases such as diabetes.   Specific eye diseases such as chronic inflammation inside the eye or a sudden attack of a rare form of glaucoma.   Inherited or acquired medical problems.  SYMPTOMS   Gradual, progressive drop in vision in the affected eye.   Severe, rapid visual loss. This most often happens when trauma is the cause.    DIAGNOSIS  To detect a cataract, an eye doctor examines the lens. Cataracts are best diagnosed with an exam of the eyes with the pupils enlarged (dilated) by drops.  TREATMENT  For an early cataract, vision may improve by using different eyeglasses or stronger lighting. If that does not help your vision, surgery is the only effective treatment. A cataract needs to be surgically removed when vision loss interferes with your everyday activities, such as driving, reading, or watching TV. A cataract may also have to be removed if it prevents examination or treatment of another eye problem. Surgery removes the cloudy lens and usually replaces it with a substitute lens (intraocular lens, IOL).  At a time when both you and your doctor agree, the cataract will be surgically removed. If you have cataracts in both eyes, only one is usually removed at a time. This allows the operated eye to heal and be out of danger from any possible problems after surgery (such as infection or poor wound healing). In rare cases, a cataract may be doing damage to your eye. In these cases, your caregiver may advise surgical removal right away. The vast majority of people who have cataract surgery have better vision afterward. HOME CARE INSTRUCTIONS  If you are not planning surgery, you may be asked to do the following:  Use different eyeglasses.   Use stronger or brighter lighting.  Ask your eye doctor about reducing your medicine dose or changing medicines if it is thought that a medicine caused your cataract. Changing medicines does not make the cataract go away on its own.   Become familiar with your surroundings. Poor vision can lead to injury. Avoid bumping into things on the affected side. You are at a higher risk for tripping or falling.   Exercise extreme care when driving or operating machinery.   Wear sunglasses if you are sensitive to bright light or experiencing problems with glare.  SEEK IMMEDIATE MEDICAL CARE  IF:   You have a worsening or sudden vision loss.   You notice redness, swelling, or increasing pain in the eye.   You have a fever.  Document Released: 07/14/2005 Document Revised: 07/03/2011 Document Reviewed: 03/07/2011 [facility] Patient Information 2012 [email or web address] INSTRUCTIONS POST-ANESTHESIA  IMMEDIATELY FOLLOWING SURGERY:  Do not drive or operate machinery for the first twenty four hours after surgery.  Do not make any important decisions for twenty four hours after surgery or while taking narcotic pain medications or sedatives.  If you develop intractable nausea and vomiting or a severe headache please notify your doctor immediately.  FOLLOW-UP:  Please make an appointment with your surgeon as instructed. You do not need to follow up with anesthesia unless specifically instructed to do so.  WOUND CARE INSTRUCTIONS (if applicable):  Keep a dry clean dressing on the anesthesia/puncture wound site if there is drainage.  Once the wound has quit draining you may leave it open to air.  Generally you should leave the bandage intact for twenty four hours unless there is drainage.  If the epidural site drains for more than 36-48 hours please call the anesthesia department.  QUESTIONS?:  Please feel free to call your physician or the hospital operator if you have any questions, and they will be happy to assist you.

## 2017-02-18 ENCOUNTER — Encounter (HOSPITAL_COMMUNITY): Payer: Self-pay

## 2017-02-18 ENCOUNTER — Encounter (HOSPITAL_COMMUNITY)
Admission: RE | Admit: 2017-02-18 | Discharge: 2017-02-18 | Disposition: A | Discharge status: Home / Self Care | Payer: Medicare Other | Source: Ambulatory Visit | Attending: Ophthalmology | Admitting: Ophthalmology

## 2017-02-18 DIAGNOSIS — H2511 Age-related nuclear cataract, right eye: Secondary | ICD-10-CM | POA: Diagnosis not present

## 2017-02-18 DIAGNOSIS — Z01812 Encounter for preprocedural laboratory examination: Secondary | ICD-10-CM | POA: Insufficient documentation

## 2017-02-18 LAB — CBC WITH DIFFERENTIAL/PLATELET
Basophils Absolute: 0 10*3/uL (ref 0.0–0.1)
Basophils Relative: 0 %
Eosinophils Absolute: 0.1 10*3/uL (ref 0.0–0.7)
Eosinophils Relative: 1 %
HEMATOCRIT: 51.6 % — AB (ref 36.0–46.0)
Hemoglobin: 17.7 g/dL — ABNORMAL HIGH (ref 12.0–15.0)
LYMPHS ABS: 4.2 10*3/uL — AB (ref 0.7–4.0)
LYMPHS PCT: 35 %
MCH: 31.2 pg (ref 26.0–34.0)
MCHC: 34.3 g/dL (ref 30.0–36.0)
MCV: 91 fL (ref 78.0–100.0)
MONO ABS: 0.5 10*3/uL (ref 0.1–1.0)
MONOS PCT: 4 %
NEUTROS ABS: 7.2 10*3/uL (ref 1.7–7.7)
Neutrophils Relative %: 60 %
Platelets: 246 10*3/uL (ref 150–400)
RBC: 5.67 MIL/uL — ABNORMAL HIGH (ref 3.87–5.11)
RDW: 13.1 % (ref 11.5–15.5)
WBC: 11.9 10*3/uL — ABNORMAL HIGH (ref 4.0–10.5)

## 2017-02-18 LAB — BASIC METABOLIC PANEL
ANION GAP: 11 (ref 5–15)
BUN: 12 mg/dL (ref 6–20)
CO2: 27 mmol/L (ref 22–32)
Calcium: 9.4 mg/dL (ref 8.9–10.3)
Chloride: 97 mmol/L — ABNORMAL LOW (ref 101–111)
Creatinine, Ser: 1.07 mg/dL — ABNORMAL HIGH (ref 0.44–1.00)
GFR calc Af Amer: >60 mL/min (ref >60)
GFR calc non Af Amer: 52 mL/min — ABNORMAL LOW (ref >60)
GLUCOSE: 182 mg/dL — AB (ref 65–99)
Potassium: 3.3 mmol/L — ABNORMAL LOW (ref 3.5–5.1)
Sodium: 135 mmol/L (ref 135–145)

## 2017-02-23 ENCOUNTER — Other Ambulatory Visit: Payer: Self-pay | Admitting: Internal Medicine

## 2017-02-24 ENCOUNTER — Ambulatory Visit (HOSPITAL_COMMUNITY)
Admission: RE | Admit: 2017-02-24 | Discharge: 2017-02-24 | Disposition: A | Discharge status: Home / Self Care | Payer: Medicare Other | Source: Ambulatory Visit | Attending: Ophthalmology | Admitting: Ophthalmology

## 2017-02-24 ENCOUNTER — Ambulatory Visit (HOSPITAL_COMMUNITY): Payer: Medicare Other | Admitting: Anesthesiology

## 2017-02-24 ENCOUNTER — Encounter (HOSPITAL_COMMUNITY): Payer: Self-pay

## 2017-02-24 ENCOUNTER — Encounter (HOSPITAL_COMMUNITY)
Admission: RE | Disposition: A | Discharge status: Home / Self Care | Payer: Self-pay | Source: Ambulatory Visit | Attending: Ophthalmology

## 2017-02-24 DIAGNOSIS — I10 Essential (primary) hypertension: Secondary | ICD-10-CM | POA: Diagnosis not present

## 2017-02-24 DIAGNOSIS — J449 Chronic obstructive pulmonary disease, unspecified: Secondary | ICD-10-CM | POA: Insufficient documentation

## 2017-02-24 DIAGNOSIS — F419 Anxiety disorder, unspecified: Secondary | ICD-10-CM | POA: Diagnosis not present

## 2017-02-24 DIAGNOSIS — Z79899 Other long term (current) drug therapy: Secondary | ICD-10-CM | POA: Diagnosis not present

## 2017-02-24 DIAGNOSIS — Z8673 Personal history of transient ischemic attack (TIA), and cerebral infarction without residual deficits: Secondary | ICD-10-CM | POA: Diagnosis not present

## 2017-02-24 DIAGNOSIS — M797 Fibromyalgia: Secondary | ICD-10-CM | POA: Diagnosis not present

## 2017-02-24 DIAGNOSIS — H2511 Age-related nuclear cataract, right eye: Secondary | ICD-10-CM | POA: Insufficient documentation

## 2017-02-24 HISTORY — PX: CATARACT EXTRACTION W/PHACO: SHX586

## 2017-02-24 SURGERY — PHACOEMULSIFICATION, CATARACT, WITH IOL INSERTION
Anesthesia: Monitor Anesthesia Care | Site: Eye | Laterality: Right

## 2017-02-24 MED ORDER — PROVISC 10 MG/ML IO SOLN
INTRAOCULAR | Status: DC | PRN
  Administered 2017-02-24: 0.85 mL via INTRAOCULAR

## 2017-02-24 MED ORDER — CYCLOPENTOLATE-PHENYLEPHRINE 0.2-1 % OP SOLN
1.0000 [drp] | OPHTHALMIC | Status: AC
  Administered 2017-02-24 (×3): 1 [drp] via OPHTHALMIC

## 2017-02-24 MED ORDER — LIDOCAINE HCL (PF) 1 % IJ SOLN
INTRAMUSCULAR | Status: DC | PRN
  Administered 2017-02-24: .6 mL

## 2017-02-24 MED ORDER — BSS IO SOLN
INTRAOCULAR | Status: DC | PRN
  Administered 2017-02-24: 500 mL

## 2017-02-24 MED ORDER — POVIDONE-IODINE 5 % OP SOLN
OPHTHALMIC | Status: DC | PRN
  Administered 2017-02-24: 1 via OPHTHALMIC

## 2017-02-24 MED ORDER — MIDAZOLAM HCL 2 MG/2ML IJ SOLN
1.0000 mg | INTRAMUSCULAR | Status: AC
  Administered 2017-02-24: 2 mg via INTRAVENOUS

## 2017-02-24 MED ORDER — PHENYLEPHRINE HCL 2.5 % OP SOLN
1.0000 [drp] | OPHTHALMIC | Status: AC
  Administered 2017-02-24 (×3): 1 [drp] via OPHTHALMIC

## 2017-02-24 MED ORDER — LIDOCAINE HCL 3.5 % OP GEL
1.0000 "application " | Freq: Once | OPHTHALMIC | Status: AC
  Administered 2017-02-24: 1 via OPHTHALMIC

## 2017-02-24 MED ORDER — MIDAZOLAM HCL 2 MG/2ML IJ SOLN
INTRAMUSCULAR | Status: AC
  Filled 2017-02-24: qty 2

## 2017-02-24 MED ORDER — LACTATED RINGERS IV SOLN
INTRAVENOUS | Status: DC
  Administered 2017-02-24: 10:00:00 via INTRAVENOUS

## 2017-02-24 MED ORDER — BSS IO SOLN
INTRAOCULAR | Status: DC | PRN
  Administered 2017-02-24: 15 mL via INTRAOCULAR

## 2017-02-24 MED ORDER — NEOMYCIN-POLYMYXIN-DEXAMETH 3.5-10000-0.1 OP SUSP
OPHTHALMIC | Status: DC | PRN
  Administered 2017-02-24: 2 [drp] via OPHTHALMIC

## 2017-02-24 MED ORDER — FENTANYL CITRATE (PF) 100 MCG/2ML IJ SOLN
INTRAMUSCULAR | Status: AC
  Filled 2017-02-24: qty 2

## 2017-02-24 MED ORDER — TETRACAINE HCL 0.5 % OP SOLN
1.0000 [drp] | OPHTHALMIC | Status: AC
  Administered 2017-02-24 (×3): 1 [drp] via OPHTHALMIC

## 2017-02-24 MED ORDER — FENTANYL CITRATE (PF) 100 MCG/2ML IJ SOLN
25.0000 ug | Freq: Once | INTRAMUSCULAR | Status: AC
  Administered 2017-02-24: 25 ug via INTRAVENOUS

## 2017-02-24 SURGICAL SUPPLY — 12 items
CLOTH BEACON ORANGE TIMEOUT ST (SAFETY) ×1 IMPLANT
EYE SHIELD UNIVERSAL CLEAR (GAUZE/BANDAGES/DRESSINGS) ×1 IMPLANT
GLOVE BIOGEL PI IND STRL 7.0 (GLOVE) IMPLANT
GLOVE BIOGEL PI IND STRL 7.5 (GLOVE) IMPLANT
GLOVE BIOGEL PI INDICATOR 7.0 (GLOVE) ×2
GLOVE BIOGEL PI INDICATOR 7.5 (GLOVE) ×1
LENS ALC ACRYL/TECN (Ophthalmic Related) ×1 IMPLANT
PAD ARMBOARD 7.5X6 YLW CONV (MISCELLANEOUS) ×1 IMPLANT
SYRINGE LUER LOK 1CC (MISCELLANEOUS) ×1 IMPLANT
TAPE SURG TRANSPORE 1 IN (GAUZE/BANDAGES/DRESSINGS) IMPLANT
TAPE SURGICAL TRANSPORE 1 IN (GAUZE/BANDAGES/DRESSINGS) ×1
WATER STERILE IRR 250ML POUR (IV SOLUTION) ×1 IMPLANT

## 2017-02-24 NOTE — Anesthesia Postprocedure Evaluation (Signed)
Anesthesia Post Note  Patient: Brandi Bean  Procedure(s) Performed: Procedure(s) (LRB): CATARACT EXTRACTION PHACO AND INTRAOCULAR LENS PLACEMENT (IOC) (Right)  Patient location during evaluation: Short Stay Anesthesia Type: MAC Level of consciousness: awake and alert and oriented Pain management: pain level controlled Vital Signs Assessment: post-procedure vital signs reviewed and stable Respiratory status: spontaneous breathing Cardiovascular status: stable Postop Assessment: no signs of nausea or vomiting Anesthetic complications: no     Last Vitals:  Vitals:   02/24/17 1025 02/24/17 1030  BP: (!) 107/54   Pulse:    Resp: 14 16  Temp:      Last Pain:  Vitals:   02/24/17 0933  TempSrc: Oral                 Tyshae Stair

## 2017-02-24 NOTE — H&P (Signed)
I have reviewed the H&P, the patient was re-examined, and I have identified no interval changes in medical condition and plan of care since the history and physical of record  

## 2017-02-24 NOTE — Op Note (Signed)
Date of Admission: 02/24/2017  Date of Surgery: 02/24/2017  Pre-Op Dx: Cataract Right  Eye  Post-Op Dx: Senile Nuclear Cataract  Right  Eye,  Dx Code H25.11  Surgeon: Tonny Branch, M.D.  Assistants: None  Anesthesia: Topical with MAC  Indications: Painless, progressive loss of vision with compromise of daily activities.  Surgery: Cataract Extraction with Intraocular lens Implant Right Eye  Discription: The patient had dilating drops and viscous lidocaine placed into the Right eye in the pre-op holding area. After transfer to the operating room, a time out was performed. The patient was then prepped and draped. Beginning with a 22m blade a paracentesis port was made at the surgeon's 2 o'clock position. The anterior chamber was then filled with 1% non-preserved lidocaine. This was followed by filling the anterior chamber with Provisc.  A 2.424mkeratome blade was used to make a clear corneal incision at the temporal limbus.  A bent cystatome needle was used to create a continuous tear capsulotomy. Hydrodissection was performed with balanced salt solution on a Fine canula. The lens nucleus was then removed using the phacoemulsification handpiece. Residual cortex was removed with the I&A handpiece. The anterior chamber and capsular bag were refilled with Provisc. A posterior chamber intraocular lens was placed into the capsular bag with it's injector. The implant was positioned with the Kuglan hook. The Provisc was then removed from the anterior chamber and capsular bag with the I&A handpiece. Stromal hydration of the main incision and paracentesis port was performed with BSS on a Fine canula. The wounds were tested for leak which was negative. The patient tolerated the procedure well. There were no operative complications. The patient was then transferred to the recovery room in stable condition.  Complications: None  Specimen: None  EBL: None  Prosthetic device: Abbott Technis, PCB00, power 20.5,  SN [id number]

## 2017-02-24 NOTE — Anesthesia Preprocedure Evaluation (Signed)
Anesthesia Evaluation  Patient identified by MRN, date of birth, ID band Patient awake    Reviewed: Allergy & Precautions, NPO status , Patient's Chart, lab work & pertinent test results, reviewed documented beta blocker date and time   History of Anesthesia Complications Negative for: history of anesthetic complications  Airway Mallampati: I  TM Distance: >3 FB Neck ROM: Full    Dental  (+) Missing, Dental Advisory Given, Chipped   Pulmonary shortness of breath, sleep apnea , COPD, Current Smoker,    breath sounds clear to auscultation       Cardiovascular hypertension, Pt. on medications and Pt. on home beta blockers (-) angina Rhythm:Regular Rate:Normal     Neuro/Psych  Headaches, PSYCHIATRIC DISORDERS Anxiety Depression TIA   GI/Hepatic negative GI ROS, Neg liver ROS,   Endo/Other  negative endocrine ROS  Renal/GU negative Renal ROS     Musculoskeletal  (+) Arthritis , Fibromyalgia -  Abdominal   Peds  Hematology negative hematology ROS (+)   Anesthesia Other Findings   Reproductive/Obstetrics                             Anesthesia Physical Anesthesia Plan  ASA: III  Anesthesia Plan: MAC   Post-op Pain Management:    Induction: Intravenous  PONV Risk Score and Plan:   Airway Management Planned: Nasal Cannula  Additional Equipment:   Intra-op Plan:   Post-operative Plan:   Informed Consent: I have reviewed the patients History and Physical, chart, labs and discussed the procedure including the risks, benefits and alternatives for the proposed anesthesia with the patient or authorized representative who has indicated his/her understanding and acceptance.     Plan Discussed with:   Anesthesia Plan Comments:         Anesthesia Quick Evaluation

## 2017-02-24 NOTE — Transfer of Care (Signed)
Immediate Anesthesia Transfer of Care Note  Patient: Brandi Bean  Procedure(s) Performed: Procedure(s) with comments: CATARACT EXTRACTION PHACO AND INTRAOCULAR LENS PLACEMENT (IOC) (Right) - CDE: 9.96  Patient Location: Short Stay  Anesthesia Type:MAC  Level of Consciousness: awake  Airway & Oxygen Therapy: Patient Spontanous Breathing  Post-op Assessment: Report given to RN  Post vital signs: Reviewed  Last Vitals:  Vitals:   02/24/17 1025 02/24/17 1030  BP: (!) 107/54   Pulse:    Resp: 14 16  Temp:      Last Pain:  Vitals:   02/24/17 0933  TempSrc: Oral         Complications: No apparent anesthesia complications

## 2017-02-26 ENCOUNTER — Ambulatory Visit (HOSPITAL_COMMUNITY)
Admission: RE | Admit: 2017-02-26 | Discharge: 2017-02-26 | Disposition: A | Discharge status: Home / Self Care | Payer: Medicare Other | Source: Ambulatory Visit | Attending: Acute Care | Admitting: Acute Care

## 2017-02-26 DIAGNOSIS — I700 Atherosclerosis of aorta: Secondary | ICD-10-CM | POA: Insufficient documentation

## 2017-02-26 DIAGNOSIS — F17210 Nicotine dependence, cigarettes, uncomplicated: Secondary | ICD-10-CM

## 2017-03-02 DIAGNOSIS — H2512 Age-related nuclear cataract, left eye: Secondary | ICD-10-CM | POA: Diagnosis not present

## 2017-03-03 ENCOUNTER — Other Ambulatory Visit: Payer: Self-pay | Admitting: Acute Care

## 2017-03-03 DIAGNOSIS — R911 Solitary pulmonary nodule: Secondary | ICD-10-CM

## 2017-03-03 DIAGNOSIS — IMO0001 Reserved for inherently not codable concepts without codable children: Secondary | ICD-10-CM

## 2017-03-03 DIAGNOSIS — F17200 Nicotine dependence, unspecified, uncomplicated: Secondary | ICD-10-CM

## 2017-03-05 ENCOUNTER — Encounter (HOSPITAL_COMMUNITY): Payer: Self-pay

## 2017-03-05 ENCOUNTER — Encounter (HOSPITAL_COMMUNITY)
Admission: RE | Admit: 2017-03-05 | Discharge: 2017-03-05 | Disposition: A | Discharge status: Home / Self Care | Payer: Medicare Other | Source: Ambulatory Visit | Attending: Ophthalmology | Admitting: Ophthalmology

## 2017-03-09 ENCOUNTER — Other Ambulatory Visit: Payer: Self-pay | Admitting: Acute Care

## 2017-03-09 ENCOUNTER — Ambulatory Visit (HOSPITAL_COMMUNITY)
Admission: RE | Admit: 2017-03-09 | Discharge: 2017-03-09 | Disposition: A | Discharge status: Home / Self Care | Payer: Medicare Other | Source: Ambulatory Visit | Attending: Ophthalmology | Admitting: Ophthalmology

## 2017-03-09 ENCOUNTER — Encounter (HOSPITAL_COMMUNITY)
Admission: RE | Disposition: A | Discharge status: Home / Self Care | Payer: Self-pay | Source: Ambulatory Visit | Attending: Ophthalmology

## 2017-03-09 ENCOUNTER — Ambulatory Visit (HOSPITAL_COMMUNITY): Payer: Medicare Other | Admitting: Anesthesiology

## 2017-03-09 ENCOUNTER — Encounter (HOSPITAL_COMMUNITY): Payer: Self-pay | Admitting: *Deleted

## 2017-03-09 DIAGNOSIS — F17210 Nicotine dependence, cigarettes, uncomplicated: Secondary | ICD-10-CM | POA: Diagnosis not present

## 2017-03-09 DIAGNOSIS — M1990 Unspecified osteoarthritis, unspecified site: Secondary | ICD-10-CM | POA: Diagnosis not present

## 2017-03-09 DIAGNOSIS — Z8673 Personal history of transient ischemic attack (TIA), and cerebral infarction without residual deficits: Secondary | ICD-10-CM | POA: Diagnosis not present

## 2017-03-09 DIAGNOSIS — G4733 Obstructive sleep apnea (adult) (pediatric): Secondary | ICD-10-CM | POA: Insufficient documentation

## 2017-03-09 DIAGNOSIS — M797 Fibromyalgia: Secondary | ICD-10-CM | POA: Diagnosis not present

## 2017-03-09 DIAGNOSIS — H2512 Age-related nuclear cataract, left eye: Secondary | ICD-10-CM | POA: Diagnosis not present

## 2017-03-09 DIAGNOSIS — I10 Essential (primary) hypertension: Secondary | ICD-10-CM | POA: Diagnosis not present

## 2017-03-09 DIAGNOSIS — Z122 Encounter for screening for malignant neoplasm of respiratory organs: Secondary | ICD-10-CM

## 2017-03-09 DIAGNOSIS — J449 Chronic obstructive pulmonary disease, unspecified: Secondary | ICD-10-CM | POA: Insufficient documentation

## 2017-03-09 HISTORY — PX: CATARACT EXTRACTION W/PHACO: SHX586

## 2017-03-09 SURGERY — PHACOEMULSIFICATION, CATARACT, WITH IOL INSERTION
Anesthesia: Monitor Anesthesia Care | Site: Eye | Laterality: Left

## 2017-03-09 MED ORDER — NEOMYCIN-POLYMYXIN-DEXAMETH 3.5-10000-0.1 OP SUSP
OPHTHALMIC | Status: DC | PRN
  Administered 2017-03-09: 2 [drp] via OPHTHALMIC

## 2017-03-09 MED ORDER — BSS IO SOLN
INTRAOCULAR | Status: DC | PRN
  Administered 2017-03-09: 15 mL via INTRAOCULAR

## 2017-03-09 MED ORDER — EPINEPHRINE PF 1 MG/ML IJ SOLN
INTRAOCULAR | Status: DC | PRN
  Administered 2017-03-09: 500 mL

## 2017-03-09 MED ORDER — LACTATED RINGERS IV SOLN
INTRAVENOUS | Status: DC
  Administered 2017-03-09: 08:00:00 via INTRAVENOUS

## 2017-03-09 MED ORDER — FENTANYL CITRATE (PF) 100 MCG/2ML IJ SOLN
INTRAMUSCULAR | Status: AC
  Filled 2017-03-09: qty 2

## 2017-03-09 MED ORDER — MIDAZOLAM HCL 2 MG/2ML IJ SOLN
1.0000 mg | INTRAMUSCULAR | Status: AC
  Administered 2017-03-09 (×2): 2 mg via INTRAVENOUS
  Filled 2017-03-09: qty 2

## 2017-03-09 MED ORDER — MIDAZOLAM HCL 2 MG/2ML IJ SOLN
INTRAMUSCULAR | Status: AC
  Filled 2017-03-09: qty 2

## 2017-03-09 MED ORDER — MIDAZOLAM HCL 5 MG/5ML IJ SOLN
INTRAMUSCULAR | Status: DC | PRN
  Administered 2017-03-09: 1 mg via INTRAVENOUS

## 2017-03-09 MED ORDER — EPINEPHRINE PF 1 MG/ML IJ SOLN
INTRAMUSCULAR | Status: AC
Start: 2017-03-09 — End: ?
  Filled 2017-03-09: qty 1

## 2017-03-09 MED ORDER — LIDOCAINE HCL (PF) 1 % IJ SOLN
INTRAMUSCULAR | Status: DC | PRN
  Administered 2017-03-09: .5 mL

## 2017-03-09 MED ORDER — FENTANYL CITRATE (PF) 100 MCG/2ML IJ SOLN
25.0000 ug | Freq: Once | INTRAMUSCULAR | Status: AC
  Administered 2017-03-09: 25 ug via INTRAVENOUS

## 2017-03-09 MED ORDER — PHENYLEPHRINE HCL 2.5 % OP SOLN
1.0000 [drp] | OPHTHALMIC | Status: AC
  Administered 2017-03-09 (×3): 1 [drp] via OPHTHALMIC

## 2017-03-09 MED ORDER — PROVISC 10 MG/ML IO SOLN
INTRAOCULAR | Status: DC | PRN
  Administered 2017-03-09: 0.85 mL via INTRAOCULAR

## 2017-03-09 MED ORDER — CYCLOPENTOLATE-PHENYLEPHRINE 0.2-1 % OP SOLN
1.0000 [drp] | OPHTHALMIC | Status: AC
  Administered 2017-03-09 (×3): 1 [drp] via OPHTHALMIC

## 2017-03-09 MED ORDER — TETRACAINE HCL 0.5 % OP SOLN
1.0000 [drp] | OPHTHALMIC | Status: AC
  Administered 2017-03-09 (×3): 1 [drp] via OPHTHALMIC

## 2017-03-09 MED ORDER — LIDOCAINE HCL 3.5 % OP GEL
1.0000 "application " | Freq: Once | OPHTHALMIC | Status: AC
  Administered 2017-03-09: 1 via OPHTHALMIC

## 2017-03-09 MED ORDER — POVIDONE-IODINE 5 % OP SOLN
OPHTHALMIC | Status: DC | PRN
  Administered 2017-03-09: 1 via OPHTHALMIC

## 2017-03-09 SURGICAL SUPPLY — 14 items
CLOTH BEACON ORANGE TIMEOUT ST (SAFETY) ×1 IMPLANT
EYE SHIELD UNIVERSAL CLEAR (GAUZE/BANDAGES/DRESSINGS) ×1 IMPLANT
GLOVE BIOGEL PI IND STRL 6.5 (GLOVE) IMPLANT
GLOVE BIOGEL PI IND STRL 7.0 (GLOVE) IMPLANT
GLOVE BIOGEL PI INDICATOR 6.5 (GLOVE) ×1
GLOVE BIOGEL PI INDICATOR 7.0 (GLOVE) ×2
PAD ARMBOARD 7.5X6 YLW CONV (MISCELLANEOUS) ×1 IMPLANT
RING MALYGIN (MISCELLANEOUS) IMPLANT
SIGHTPATH CAT PROC W REG LENS (Ophthalmic Related) ×2 IMPLANT
SYRINGE LUER LOK 1CC (MISCELLANEOUS) ×1 IMPLANT
TAPE SURG TRANSPORE 1 IN (GAUZE/BANDAGES/DRESSINGS) IMPLANT
TAPE SURGICAL TRANSPORE 1 IN (GAUZE/BANDAGES/DRESSINGS) ×1
VISCOELASTIC ADDITIONAL (OPHTHALMIC RELATED) IMPLANT
WATER STERILE IRR 250ML POUR (IV SOLUTION) ×1 IMPLANT

## 2017-03-09 NOTE — Op Note (Signed)
Date of Admission: 03/09/2017  Date of Surgery: 03/09/2017  Pre-Op Dx: Cataract Left  Eye  Post-Op Dx: Senile Nuclear Cataract  Left  Eye,  Dx Code H25.12  Surgeon: Tonny Branch, M.D.  Assistants: None  Anesthesia: Topical with MAC  Indications: Painless, progressive loss of vision with compromise of daily activities.  Surgery: Cataract Extraction with Intraocular lens Implant Left Eye  Discription: The patient had dilating drops and viscous lidocaine placed into the Left eye in the pre-op holding area. After transfer to the operating room, a time out was performed. The patient was then prepped and draped. Beginning with a 9m blade a paracentesis port was made at the surgeon's 2 o'clock position. The anterior chamber was then filled with 1% non-preserved lidocaine. This was followed by filling the anterior chamber with Provisc.  A 2.459mkeratome blade was used to make a clear corneal incision at the temporal limbus.  A bent cystatome needle was used to create a continuous tear capsulotomy. Hydrodissection was performed with balanced salt solution on a Fine canula. The lens nucleus was then removed using the phacoemulsification handpiece. Residual cortex was removed with the I&A handpiece. The anterior chamber and capsular bag were refilled with Provisc. A posterior chamber intraocular lens was placed into the capsular bag with it's injector. The implant was positioned with the Kuglan hook. The Provisc was then removed from the anterior chamber and capsular bag with the I&A handpiece. Stromal hydration of the main incision and paracentesis port was performed with BSS on a Fine canula. The wounds were tested for leak which was negative. The patient tolerated the procedure well. There were no operative complications. The patient was then transferred to the recovery room in stable condition.  Complications: None  Specimen: None  EBL: None  Prosthetic device: Abbott Technis, PCB00, power 22.0, SN  [id number]

## 2017-03-09 NOTE — H&P (Signed)
I have reviewed the H&P, the patient was re-examined, and I have identified no interval changes in medical condition and plan of care since the history and physical of record  

## 2017-03-09 NOTE — Anesthesia Postprocedure Evaluation (Signed)
Anesthesia Post Note  Patient: Brandi Bean  Procedure(s) Performed: Procedure(s) (LRB): CATARACT EXTRACTION PHACO AND INTRAOCULAR LENS PLACEMENT LEFT EYE (Left)  Patient location during evaluation: Short Stay Anesthesia Type: MAC Level of consciousness: awake and alert, patient cooperative and oriented Pain management: pain level controlled Vital Signs Assessment: post-procedure vital signs reviewed and stable Respiratory status: spontaneous breathing and nonlabored ventilation Cardiovascular status: blood pressure returned to baseline Postop Assessment: no headache, no signs of nausea or vomiting, no backache and adequate PO intake Anesthetic complications: no     Last Vitals:  Vitals:   03/09/17 0925 03/09/17 0930  BP: 102/64 (!) 98/56  Pulse:    Resp: (!) 30 (!) 37  Temp:    SpO2: 100% 100%    Last Pain:  Vitals:   03/09/17 0803  TempSrc: Oral                 Brandi Bean

## 2017-03-09 NOTE — Transfer of Care (Signed)
Immediate Anesthesia Transfer of Care Note  Patient: Brandi Bean  Procedure(s) Performed: Procedure(s) with comments: CATARACT EXTRACTION PHACO AND INTRAOCULAR LENS PLACEMENT LEFT EYE (Left) - CDE: 7.10  Patient Location: Short Stay  Anesthesia Type:MAC  Level of Consciousness: awake, alert , oriented and patient cooperative  Airway & Oxygen Therapy: Patient Spontanous Breathing  Post-op Assessment: Report given to RN and Post -op Vital signs reviewed and stable  Post vital signs: Reviewed and stable  Last Vitals:  Vitals:   03/09/17 0925 03/09/17 0930  BP: 102/64 (!) 98/56  Pulse:    Resp: (!) 30 (!) 37  Temp:    SpO2: 100% 100%    Last Pain:  Vitals:   03/09/17 0803  TempSrc: Oral      Patients Stated Pain Goal: 4 (65/46/50 3546)  Complications: No apparent anesthesia complications

## 2017-03-09 NOTE — Anesthesia Preprocedure Evaluation (Signed)
Anesthesia Evaluation  Patient identified by MRN, date of birth, ID band Patient awake    Reviewed: Allergy & Precautions, NPO status , Patient's Chart, lab work & pertinent test results, reviewed documented beta blocker date and time   History of Anesthesia Complications Negative for: history of anesthetic complications  Airway Mallampati: I  TM Distance: >3 FB Neck ROM: Full    Dental  (+) Missing, Dental Advisory Given, Chipped   Pulmonary shortness of breath, sleep apnea , COPD, Current Smoker,    breath sounds clear to auscultation       Cardiovascular hypertension, Pt. on medications and Pt. on home beta blockers (-) angina Rhythm:Regular Rate:Normal     Neuro/Psych  Headaches, PSYCHIATRIC DISORDERS Anxiety Depression TIA   GI/Hepatic negative GI ROS, Neg liver ROS,   Endo/Other  negative endocrine ROS  Renal/GU negative Renal ROS     Musculoskeletal  (+) Arthritis , Fibromyalgia -  Abdominal   Peds  Hematology negative hematology ROS (+)   Anesthesia Other Findings   Reproductive/Obstetrics                             Anesthesia Physical Anesthesia Plan  ASA: III  Anesthesia Plan: MAC   Post-op Pain Management:    Induction: Intravenous  PONV Risk Score and Plan:   Airway Management Planned: Nasal Cannula  Additional Equipment:   Intra-op Plan:   Post-operative Plan:   Informed Consent: I have reviewed the patients History and Physical, chart, labs and discussed the procedure including the risks, benefits and alternatives for the proposed anesthesia with the patient or authorized representative who has indicated his/her understanding and acceptance.     Plan Discussed with:   Anesthesia Plan Comments:         Anesthesia Quick Evaluation  

## 2017-03-09 NOTE — Discharge Instructions (Signed)

## 2017-03-10 ENCOUNTER — Encounter (HOSPITAL_COMMUNITY): Payer: Self-pay | Admitting: Ophthalmology

## 2017-04-23 DIAGNOSIS — M4802 Spinal stenosis, cervical region: Secondary | ICD-10-CM | POA: Diagnosis not present

## 2017-04-24 DIAGNOSIS — N390 Urinary tract infection, site not specified: Secondary | ICD-10-CM | POA: Diagnosis not present

## 2017-05-21 DIAGNOSIS — G894 Chronic pain syndrome: Secondary | ICD-10-CM | POA: Diagnosis not present

## 2017-05-21 DIAGNOSIS — Z6823 Body mass index (BMI) 23.0-23.9, adult: Secondary | ICD-10-CM | POA: Diagnosis not present

## 2017-05-21 DIAGNOSIS — J449 Chronic obstructive pulmonary disease, unspecified: Secondary | ICD-10-CM | POA: Diagnosis not present

## 2017-05-21 DIAGNOSIS — I10 Essential (primary) hypertension: Secondary | ICD-10-CM | POA: Diagnosis not present

## 2017-05-21 DIAGNOSIS — Z23 Encounter for immunization: Secondary | ICD-10-CM | POA: Diagnosis not present

## 2017-05-21 DIAGNOSIS — E782 Mixed hyperlipidemia: Secondary | ICD-10-CM | POA: Diagnosis not present

## 2017-06-30 DIAGNOSIS — Z6822 Body mass index (BMI) 22.0-22.9, adult: Secondary | ICD-10-CM | POA: Diagnosis not present

## 2017-06-30 DIAGNOSIS — J449 Chronic obstructive pulmonary disease, unspecified: Secondary | ICD-10-CM | POA: Diagnosis not present

## 2017-06-30 DIAGNOSIS — G894 Chronic pain syndrome: Secondary | ICD-10-CM | POA: Diagnosis not present

## 2017-06-30 DIAGNOSIS — I10 Essential (primary) hypertension: Secondary | ICD-10-CM | POA: Diagnosis not present

## 2017-06-30 DIAGNOSIS — E782 Mixed hyperlipidemia: Secondary | ICD-10-CM | POA: Diagnosis not present

## 2017-06-30 DIAGNOSIS — M1991 Primary osteoarthritis, unspecified site: Secondary | ICD-10-CM | POA: Diagnosis not present

## 2017-06-30 DIAGNOSIS — Z0001 Encounter for general adult medical examination with abnormal findings: Secondary | ICD-10-CM | POA: Diagnosis not present

## 2017-06-30 DIAGNOSIS — Z1389 Encounter for screening for other disorder: Secondary | ICD-10-CM | POA: Diagnosis not present

## 2017-07-01 DIAGNOSIS — E748 Other specified disorders of carbohydrate metabolism: Secondary | ICD-10-CM | POA: Diagnosis not present

## 2017-07-01 DIAGNOSIS — R7309 Other abnormal glucose: Secondary | ICD-10-CM | POA: Diagnosis not present

## 2017-07-23 DIAGNOSIS — D751 Secondary polycythemia: Secondary | ICD-10-CM | POA: Diagnosis not present

## 2017-07-23 DIAGNOSIS — Z719 Counseling, unspecified: Secondary | ICD-10-CM | POA: Diagnosis not present

## 2017-07-25 DIAGNOSIS — G4730 Sleep apnea, unspecified: Secondary | ICD-10-CM | POA: Diagnosis not present

## 2017-08-03 DIAGNOSIS — J449 Chronic obstructive pulmonary disease, unspecified: Secondary | ICD-10-CM | POA: Diagnosis not present

## 2017-08-03 DIAGNOSIS — Z6821 Body mass index (BMI) 21.0-21.9, adult: Secondary | ICD-10-CM | POA: Diagnosis not present

## 2017-08-03 DIAGNOSIS — N342 Other urethritis: Secondary | ICD-10-CM | POA: Diagnosis not present

## 2017-10-12 DIAGNOSIS — R51 Headache: Secondary | ICD-10-CM | POA: Diagnosis not present

## 2017-10-12 DIAGNOSIS — G894 Chronic pain syndrome: Secondary | ICD-10-CM | POA: Diagnosis not present

## 2017-10-12 DIAGNOSIS — I700 Atherosclerosis of aorta: Secondary | ICD-10-CM | POA: Diagnosis not present

## 2017-10-12 DIAGNOSIS — N390 Urinary tract infection, site not specified: Secondary | ICD-10-CM | POA: Diagnosis not present

## 2017-10-12 DIAGNOSIS — Z6821 Body mass index (BMI) 21.0-21.9, adult: Secondary | ICD-10-CM | POA: Diagnosis not present

## 2017-12-07 DIAGNOSIS — M545 Low back pain: Secondary | ICD-10-CM | POA: Diagnosis not present

## 2017-12-07 DIAGNOSIS — N342 Other urethritis: Secondary | ICD-10-CM | POA: Diagnosis not present

## 2017-12-07 DIAGNOSIS — Z6820 Body mass index (BMI) 20.0-20.9, adult: Secondary | ICD-10-CM | POA: Diagnosis not present

## 2017-12-07 DIAGNOSIS — R1033 Periumbilical pain: Secondary | ICD-10-CM | POA: Diagnosis not present

## 2017-12-07 DIAGNOSIS — Z1389 Encounter for screening for other disorder: Secondary | ICD-10-CM | POA: Diagnosis not present

## 2017-12-14 DIAGNOSIS — R39198 Other difficulties with micturition: Secondary | ICD-10-CM | POA: Diagnosis not present

## 2017-12-14 DIAGNOSIS — R197 Diarrhea, unspecified: Secondary | ICD-10-CM | POA: Diagnosis not present

## 2017-12-16 DIAGNOSIS — A09 Infectious gastroenteritis and colitis, unspecified: Secondary | ICD-10-CM | POA: Diagnosis not present

## 2017-12-17 DIAGNOSIS — J449 Chronic obstructive pulmonary disease, unspecified: Secondary | ICD-10-CM | POA: Diagnosis not present

## 2017-12-17 DIAGNOSIS — M1991 Primary osteoarthritis, unspecified site: Secondary | ICD-10-CM | POA: Diagnosis not present

## 2017-12-17 DIAGNOSIS — I10 Essential (primary) hypertension: Secondary | ICD-10-CM | POA: Diagnosis not present

## 2017-12-17 DIAGNOSIS — A09 Infectious gastroenteritis and colitis, unspecified: Secondary | ICD-10-CM | POA: Diagnosis not present

## 2018-01-01 DIAGNOSIS — E279 Disorder of adrenal gland, unspecified: Secondary | ICD-10-CM | POA: Diagnosis not present

## 2018-01-01 DIAGNOSIS — R197 Diarrhea, unspecified: Secondary | ICD-10-CM | POA: Diagnosis not present

## 2018-01-01 DIAGNOSIS — R39198 Other difficulties with micturition: Secondary | ICD-10-CM | POA: Diagnosis not present

## 2018-01-01 DIAGNOSIS — N281 Cyst of kidney, acquired: Secondary | ICD-10-CM | POA: Diagnosis not present

## 2018-02-03 DIAGNOSIS — R109 Unspecified abdominal pain: Secondary | ICD-10-CM | POA: Diagnosis not present

## 2018-03-10 ENCOUNTER — Ambulatory Visit (HOSPITAL_COMMUNITY)
Admission: RE | Admit: 2018-03-10 | Discharge: 2018-03-10 | Disposition: A | Discharge status: Home / Self Care | Payer: Medicare Other | Source: Ambulatory Visit | Attending: Acute Care | Admitting: Acute Care

## 2018-03-10 DIAGNOSIS — F17210 Nicotine dependence, cigarettes, uncomplicated: Secondary | ICD-10-CM

## 2018-03-10 DIAGNOSIS — J479 Bronchiectasis, uncomplicated: Secondary | ICD-10-CM | POA: Insufficient documentation

## 2018-03-10 DIAGNOSIS — I700 Atherosclerosis of aorta: Secondary | ICD-10-CM | POA: Insufficient documentation

## 2018-03-10 DIAGNOSIS — D3502 Benign neoplasm of left adrenal gland: Secondary | ICD-10-CM | POA: Insufficient documentation

## 2018-03-10 DIAGNOSIS — Z122 Encounter for screening for malignant neoplasm of respiratory organs: Secondary | ICD-10-CM | POA: Diagnosis not present

## 2018-03-10 DIAGNOSIS — I2510 Atherosclerotic heart disease of native coronary artery without angina pectoris: Secondary | ICD-10-CM | POA: Diagnosis not present

## 2018-03-10 DIAGNOSIS — J432 Centrilobular emphysema: Secondary | ICD-10-CM | POA: Insufficient documentation

## 2018-03-24 ENCOUNTER — Other Ambulatory Visit: Payer: Self-pay | Admitting: Acute Care

## 2018-03-24 DIAGNOSIS — F17210 Nicotine dependence, cigarettes, uncomplicated: Secondary | ICD-10-CM

## 2018-03-24 DIAGNOSIS — Z122 Encounter for screening for malignant neoplasm of respiratory organs: Secondary | ICD-10-CM

## 2018-03-30 DIAGNOSIS — E270 Other adrenocortical overactivity: Secondary | ICD-10-CM | POA: Diagnosis not present

## 2018-03-30 DIAGNOSIS — Z6820 Body mass index (BMI) 20.0-20.9, adult: Secondary | ICD-10-CM | POA: Diagnosis not present

## 2018-04-02 DIAGNOSIS — E270 Other adrenocortical overactivity: Secondary | ICD-10-CM | POA: Diagnosis not present

## 2018-04-20 DIAGNOSIS — E270 Other adrenocortical overactivity: Secondary | ICD-10-CM | POA: Diagnosis not present

## 2018-04-20 DIAGNOSIS — Z6820 Body mass index (BMI) 20.0-20.9, adult: Secondary | ICD-10-CM | POA: Diagnosis not present

## 2018-04-29 DIAGNOSIS — Z1389 Encounter for screening for other disorder: Secondary | ICD-10-CM | POA: Diagnosis not present

## 2018-04-29 DIAGNOSIS — I700 Atherosclerosis of aorta: Secondary | ICD-10-CM | POA: Diagnosis not present

## 2018-04-29 DIAGNOSIS — I10 Essential (primary) hypertension: Secondary | ICD-10-CM | POA: Diagnosis not present

## 2018-04-29 DIAGNOSIS — J449 Chronic obstructive pulmonary disease, unspecified: Secondary | ICD-10-CM | POA: Diagnosis not present

## 2018-04-29 DIAGNOSIS — G894 Chronic pain syndrome: Secondary | ICD-10-CM | POA: Diagnosis not present

## 2018-05-13 ENCOUNTER — Other Ambulatory Visit (HOSPITAL_COMMUNITY): Payer: Self-pay | Admitting: Internal Medicine

## 2018-05-13 DIAGNOSIS — Z1231 Encounter for screening mammogram for malignant neoplasm of breast: Secondary | ICD-10-CM

## 2018-05-19 DIAGNOSIS — E782 Mixed hyperlipidemia: Secondary | ICD-10-CM | POA: Diagnosis not present

## 2018-05-19 DIAGNOSIS — Z0001 Encounter for general adult medical examination with abnormal findings: Secondary | ICD-10-CM | POA: Diagnosis not present

## 2018-05-19 DIAGNOSIS — Z1389 Encounter for screening for other disorder: Secondary | ICD-10-CM | POA: Diagnosis not present

## 2018-05-19 DIAGNOSIS — Z23 Encounter for immunization: Secondary | ICD-10-CM | POA: Diagnosis not present

## 2018-05-19 DIAGNOSIS — Z0000 Encounter for general adult medical examination without abnormal findings: Secondary | ICD-10-CM | POA: Diagnosis not present

## 2018-08-14 IMAGING — CT CT CHEST LUNG CANCER SCREENING LOW DOSE W/O CM
2 of 5 series · 15 of 40 positions shown, 18 images · non-contrast
Comparison: Low-dose lung cancer screening CT chest dated
02/26/2016

CLINICAL DATA: 65-year-old female current smoker, with 30 pack-year
history, for follow-up lung cancer screening

EXAM:
CT CHEST WITHOUT CONTRAST LOW-DOSE FOR LUNG CANCER SCREENING
TECHNIQUE: Multidetector CT imaging of the chest was performed following the
standard protocol without IV contrast.

[Series 4: lungs · axial · 0.62mm/px · z∈[-254,+12]mm · 12 of 294 slices shown, 15 images]
[im 14/294  mediastinal]
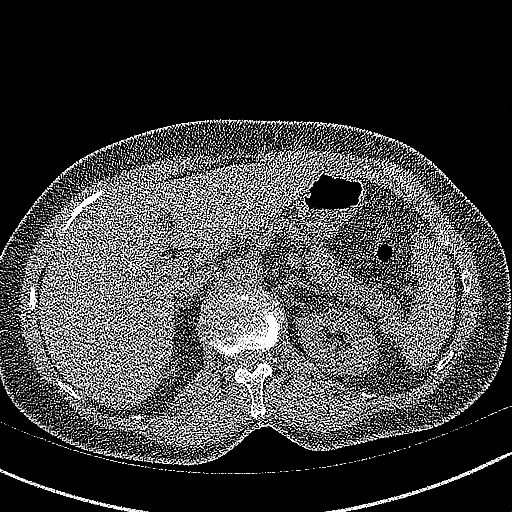
[im 14/294  lung]
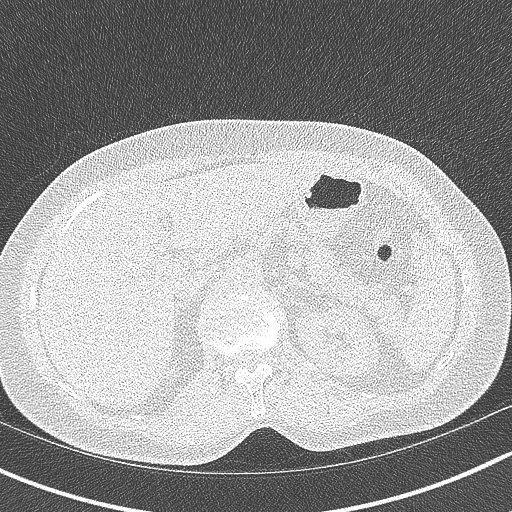
[im 42/294  lung]
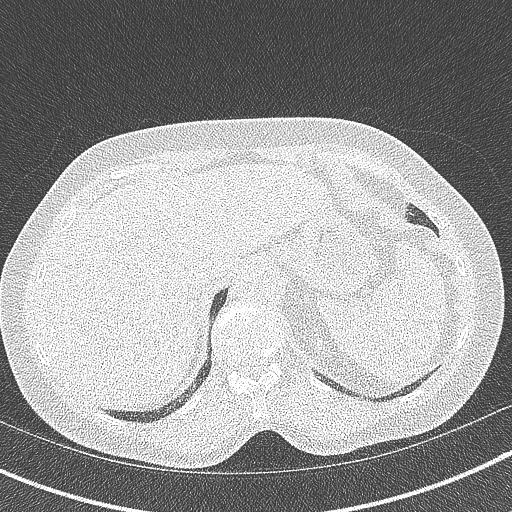
[im 70/294  lung]
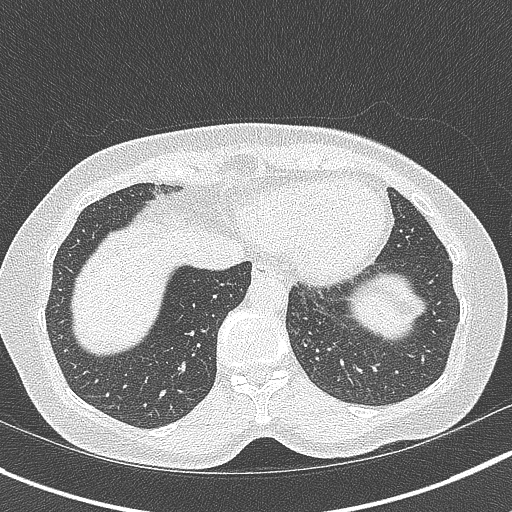
[im 84/294  lung]
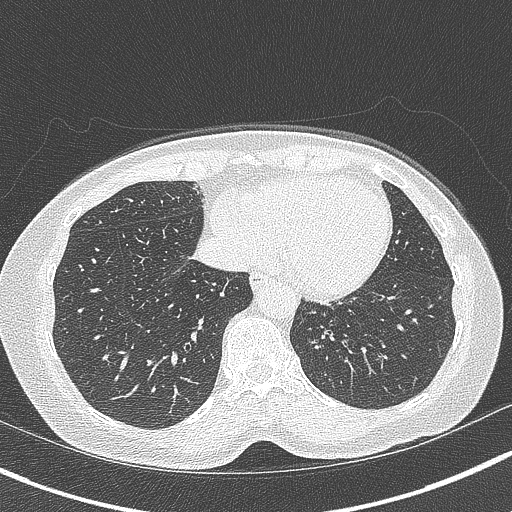
[im 112/294  mediastinal]
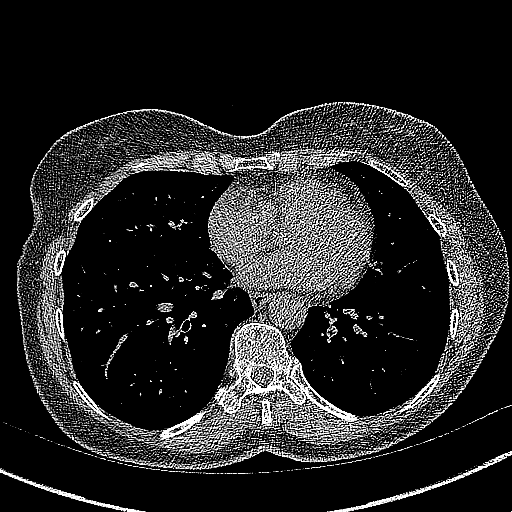
[im 112/294  lung]
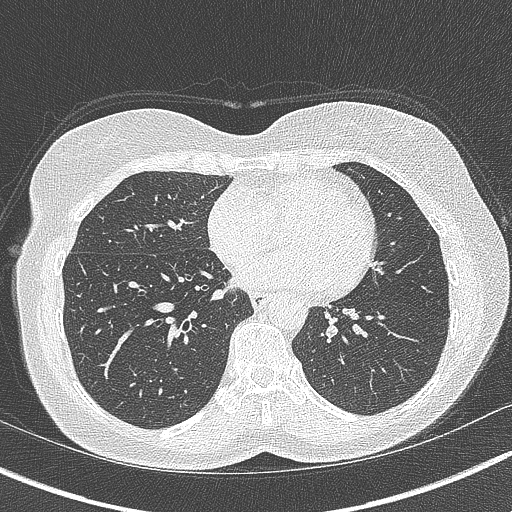
[im 140/294  lung]
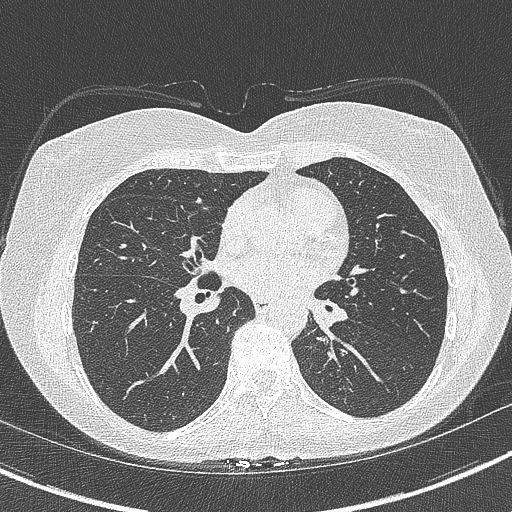
[im 154/294  lung]
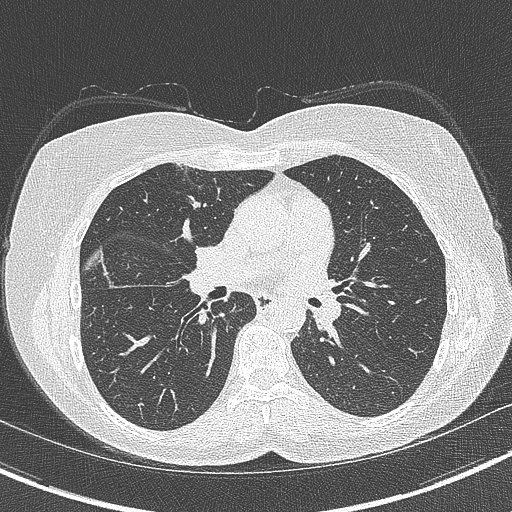
[im 182/294  lung]
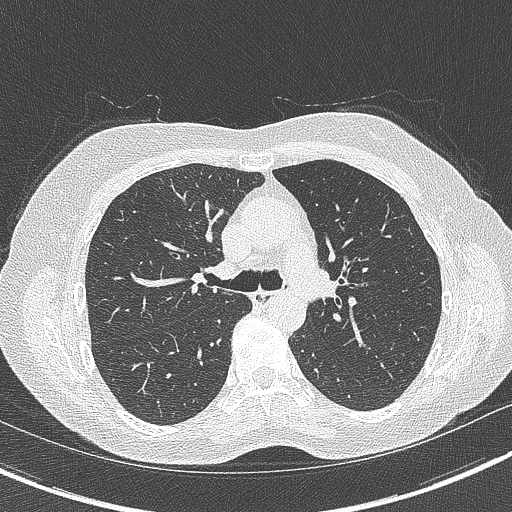
[im 210/294  mediastinal]
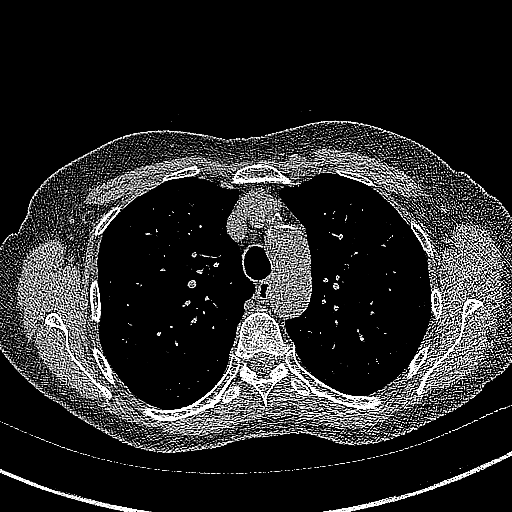
[im 210/294  lung]
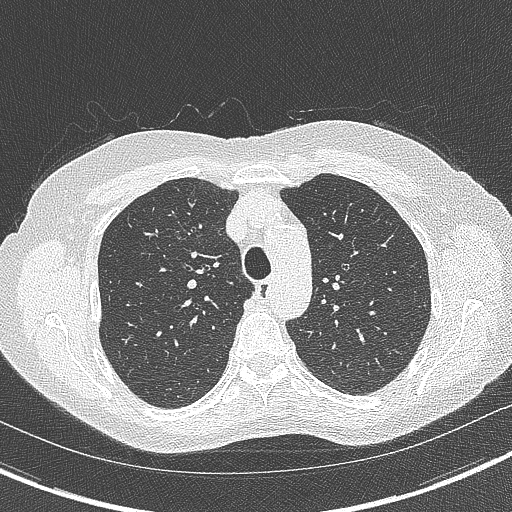
[im 224/294  lung]
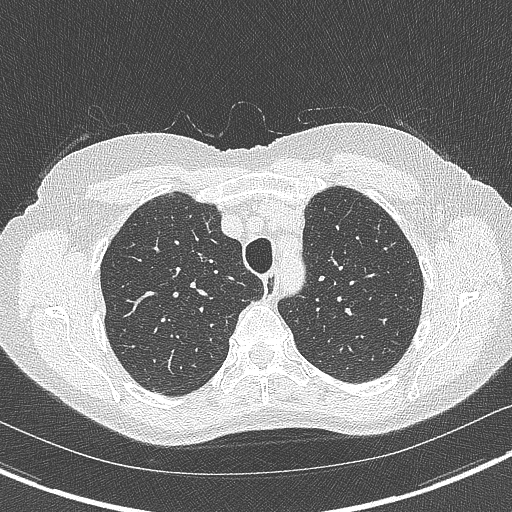
[im 252/294  lung]
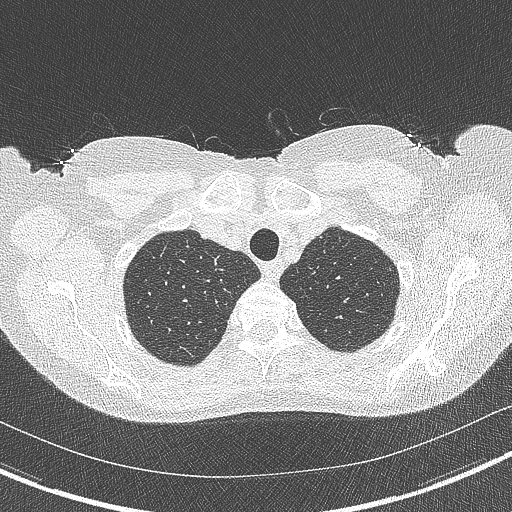
[im 280/294  lung]
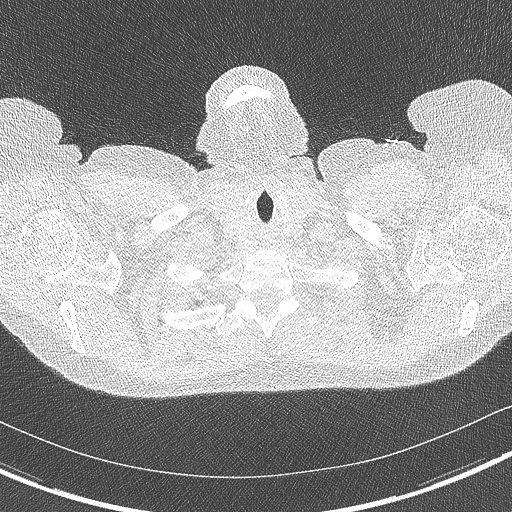

[Series 6: coronal · coronal · 0.59mm/px · 3 of 227 slices shown]
[im 46/227  lung]
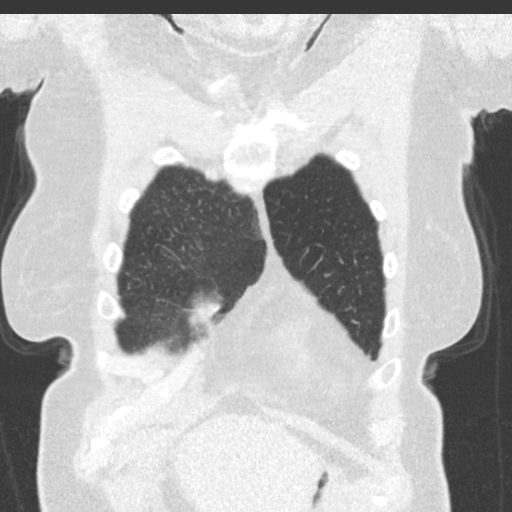
[im 91/227  lung]
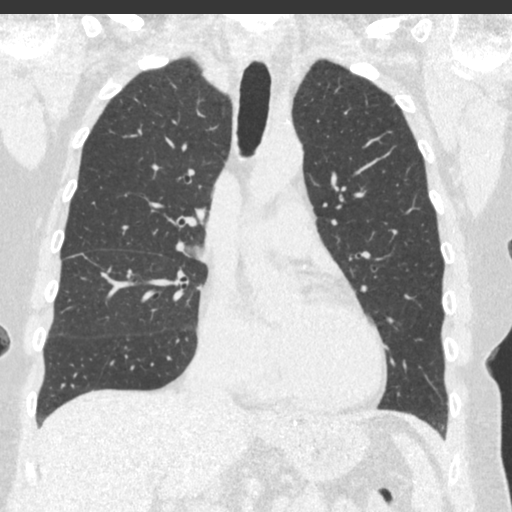
[im 136/227  lung]
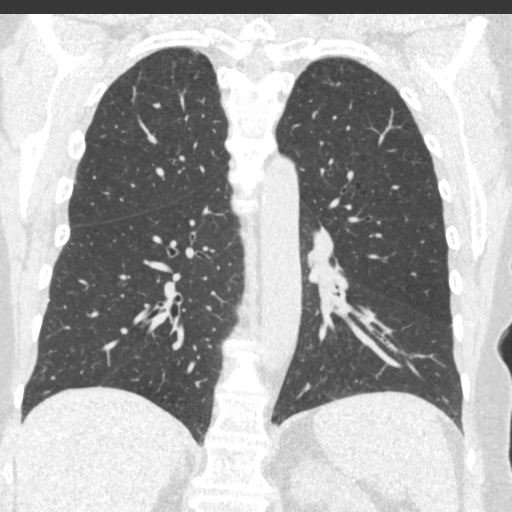

[15 of 40 positions shown; findings below may reference images not displayed]

FINDINGS: Cardiovascular: The heart is normal in size. No pericardial
effusion.

No evidence of thoracic aortic aneurysm. Mild atherosclerotic
calcifications of the aortic arch.

Mild coronary atherosclerosis of the LAD.

Mediastinum/Nodes: No suspicious mediastinal lymphadenopathy.

Visualized thyroid is unremarkable.

Lungs/Pleura: Small bilateral pulmonary nodules measuring up to
mm in the posterior right upper lobe.

No focal consolidation.

No pleural effusion or pneumothorax.

Upper Abdomen: Visualized upper abdomen is unremarkable.

Musculoskeletal: Mild degenerative changes of the visualized
thoracolumbar spine. Cervical spine fixation hardware, incompletely
visualized.
IMPRESSION: Lung-RADS 2, benign appearance or behavior. Continue annual
screening with low-dose chest CT without contrast in 12 months.

Aortic Atherosclerosis ([id number]).

## 2018-12-30 DIAGNOSIS — J449 Chronic obstructive pulmonary disease, unspecified: Secondary | ICD-10-CM | POA: Diagnosis not present

## 2018-12-30 DIAGNOSIS — W57XXXA Bitten or stung by nonvenomous insect and other nonvenomous arthropods, initial encounter: Secondary | ICD-10-CM | POA: Diagnosis not present

## 2018-12-30 DIAGNOSIS — E785 Hyperlipidemia, unspecified: Secondary | ICD-10-CM | POA: Diagnosis not present

## 2018-12-30 DIAGNOSIS — I10 Essential (primary) hypertension: Secondary | ICD-10-CM | POA: Diagnosis not present

## 2018-12-30 DIAGNOSIS — M1991 Primary osteoarthritis, unspecified site: Secondary | ICD-10-CM | POA: Diagnosis not present

## 2018-12-30 DIAGNOSIS — Z1389 Encounter for screening for other disorder: Secondary | ICD-10-CM | POA: Diagnosis not present

## 2018-12-30 DIAGNOSIS — Z0001 Encounter for general adult medical examination with abnormal findings: Secondary | ICD-10-CM | POA: Diagnosis not present

## 2018-12-30 DIAGNOSIS — S20461A Insect bite (nonvenomous) of right back wall of thorax, initial encounter: Secondary | ICD-10-CM | POA: Diagnosis not present

## 2018-12-30 DIAGNOSIS — E782 Mixed hyperlipidemia: Secondary | ICD-10-CM | POA: Diagnosis not present

## 2019-01-10 DIAGNOSIS — E0590 Thyrotoxicosis, unspecified without thyrotoxic crisis or storm: Secondary | ICD-10-CM | POA: Diagnosis not present

## 2019-01-13 ENCOUNTER — Encounter: Payer: Self-pay | Admitting: Gastroenterology

## 2019-01-18 DIAGNOSIS — Z7189 Other specified counseling: Secondary | ICD-10-CM | POA: Diagnosis not present

## 2019-01-18 DIAGNOSIS — E270 Other adrenocortical overactivity: Secondary | ICD-10-CM | POA: Diagnosis not present

## 2019-03-17 ENCOUNTER — Other Ambulatory Visit: Payer: Self-pay

## 2019-03-17 ENCOUNTER — Ambulatory Visit (HOSPITAL_COMMUNITY)
Admission: RE | Admit: 2019-03-17 | Discharge: 2019-03-17 | Disposition: A | Discharge status: Home / Self Care | Payer: Medicare Other | Source: Ambulatory Visit | Attending: Acute Care | Admitting: Acute Care

## 2019-03-17 DIAGNOSIS — F17210 Nicotine dependence, cigarettes, uncomplicated: Secondary | ICD-10-CM | POA: Diagnosis present

## 2019-03-17 DIAGNOSIS — Z122 Encounter for screening for malignant neoplasm of respiratory organs: Secondary | ICD-10-CM | POA: Diagnosis not present

## 2019-03-18 ENCOUNTER — Other Ambulatory Visit: Payer: Self-pay | Admitting: *Deleted

## 2019-03-18 DIAGNOSIS — Z122 Encounter for screening for malignant neoplasm of respiratory organs: Secondary | ICD-10-CM

## 2019-03-18 DIAGNOSIS — F17210 Nicotine dependence, cigarettes, uncomplicated: Secondary | ICD-10-CM

## 2019-03-18 DIAGNOSIS — Z87891 Personal history of nicotine dependence: Secondary | ICD-10-CM

## 2019-03-21 DIAGNOSIS — Z6820 Body mass index (BMI) 20.0-20.9, adult: Secondary | ICD-10-CM | POA: Diagnosis not present

## 2019-03-21 DIAGNOSIS — R946 Abnormal results of thyroid function studies: Secondary | ICD-10-CM | POA: Diagnosis not present

## 2019-03-23 ENCOUNTER — Encounter: Payer: Self-pay | Admitting: Acute Care

## 2019-03-23 ENCOUNTER — Other Ambulatory Visit: Payer: Self-pay

## 2019-03-23 ENCOUNTER — Ambulatory Visit (INDEPENDENT_AMBULATORY_CARE_PROVIDER_SITE_OTHER): Payer: Medicare Other | Admitting: Acute Care

## 2019-03-23 DIAGNOSIS — Z720 Tobacco use: Secondary | ICD-10-CM

## 2019-03-23 DIAGNOSIS — F17210 Nicotine dependence, cigarettes, uncomplicated: Secondary | ICD-10-CM

## 2019-03-23 DIAGNOSIS — J479 Bronchiectasis, uncomplicated: Secondary | ICD-10-CM

## 2019-03-23 MED ORDER — DOXYCYCLINE HYCLATE 100 MG PO TABS: 100.0000 mg | ORAL_TABLET | Freq: Two times a day (BID) | ORAL | 0 refills | Status: DC

## 2019-03-23 NOTE — Patient Instructions (Addendum)
It is good to talk with you today. Your CT scan shows you have Bronchiectasis. This is a chronic condition of the lungs where your airways become damaged making it harder to clear mucus.Mucus builds up, and breeds bacteria causing frequent infections.  We will treat you with Doxycycline 100 mg twice daily x 7 days ( Pease send to [facility] on [address]). Follow up in 1 month with Dr. Loanne Drilling or Judson Roch NP At follow up we will give you a flutter valve and teach you how to use it. This will help to prevent recurrent infections We will consider collecting a sputum specimen once you have been off antibiotics for 6-8 weeks.  Start Mucinex 1200 mg once daily with a full glass of water. This will help to thin your mucus, making it easier to cough up secretions. Annual  Low Dose CT 02/2020 Consider swallow evaluation Please contact office for sooner follow up if symptoms do not improve or worsen or seek emergency care

## 2019-03-23 NOTE — Progress Notes (Signed)
Virtual Visit via Video Note  I connected with Brandi Bean on 03/23/19 at  3:00 PM EDT by a video enabled telemedicine application and verified that I am speaking with the correct person using two identifiers.  I  confirmed date of birth and address to authenticate  Identity. My nurse Quentin Ore reviewed medications and ordered any refills required.  Brandi Bean is a 68 y.o. female 30 pack year smoker  with Gold Stage II COPD. She is followed by Dr. Chase Caller  Location: Patient: At home Provider: In the office at [place], [place] [address]   I discussed the limitations of evaluation and management by telemedicine and the availability of in person appointments. The patient expressed understanding and agreed to proceed.  History of Present Illness: Pt. Presents virtually for follow up of her low dose CT Lung Cancer Screening scan. Her scan was read as a Lung RADS 2: nodules that are benign in appearance and behavior with a very low likelihood of becoming a clinically active cancer due to size or lack of growth. Recommendation per radiology is for a repeat LDCT in 12 months. There was however, notation of worsened mild cylindrical bronchiectasis with associated patchy tree-in-bud opacities at the medial left lung base, suggesting recurrent aspiration. I wanted to follow up to see if she was symptomatic for signs and symptoms of aspiration. She states she does not feel she is aspirating her food. She states she has recently been seen by Dr. Prescott Gum for her thyroid, as she was losing weight.  She states she is coughing up yellow phlegm. She does not note any increase in her secretions. She denies fever or chills. She does endorse some dyspnea on exertion.     Observations/Objective: 03/17/2019 Low Dose CT Chest for Lung Cancer Screening Lung-RADS 2, benign appearance or behavior. Continue annual screening with low-dose chest CT without contrast in 12 months. Worsened  mild cylindrical bronchiectasis with associated patchy tree-in-bud opacities at the medial left lung base, suggesting recurrent aspiration. Two vessel coronary atherosclerosis. Stable left adrenal adenoma  Assessment and Plan: Worsened mild cylindrical bronchiectasis with associated patchy tree-in-bud opacities at the medial left lung base, suggesting recurrent aspiration. States she does not get choked on her food Does not have a flutter valve Does not use Mucinex on a regular basis Plan Your CT scan shows you have Bronchiectasis. This is a chronic condition of the lungs where your airways become damaged making it harder to clear mucus.Mucus builds up, and breeds bacteria causing frequent infections.  We will treat you with Doxycycline 100 mg twice daily x 7 days ( Pease send to [facility] on [address]). Follow up in 1 month with Dr. Loanne Drilling or Judson Roch NP At follow up we will give you a flutter valve and teach you how to use it. This will help to prevent recurrent infections Start Mucinex 1200 mg once daily with a full glass of water. This will help to thin your mucus, making it easier to cough up secretions. Annual  Low Dose CT 02/2020 Consider swallow evaluation  Needs PFT's>> last Spirometry of any kind was 2015 Please contact office for sooner follow up if symptoms do not improve or worsen or seek emergency care     Follow Up Instructions: Follow up Office Appointment with Dr. Loanne Drilling or Judson Roch NP in 4 weeks to ensure you are better, and for further instructions on how to better maintain your bronchiectasis ( Give and instruct on flutter valve)  Consider sputum culture once off antibiotics x 3 months for baseline culture and sensitivities  Follow up LDCT in 02/2020   I discussed the assessment and treatment plan with the patient. The patient was provided an opportunity to ask questions and all were answered. The patient agreed with the plan and demonstrated an  understanding of the instructions.   The patient was advised to call back or seek an in-person evaluation if the symptoms worsen or if the condition fails to improve as anticipated.  I provided 30  minutes of non-face-to-face time during this encounter.   Magdalen Spatz, NP 03/23/2019 3:50 PM

## 2019-03-28 ENCOUNTER — Telehealth: Payer: Self-pay | Admitting: Pulmonary Disease

## 2019-03-28 NOTE — Telephone Encounter (Signed)
atc pt, no answer, vm not set up.  Wcb.

## 2019-03-29 NOTE — Telephone Encounter (Signed)
Called and spoke to pt. Pt is requesting a follow with JE. Appt made with 9/28 at 0945. Pt verbalized understanding and denied any further questions or concerns at this time.

## 2019-04-25 ENCOUNTER — Encounter: Payer: Self-pay | Admitting: Pulmonary Disease

## 2019-04-25 ENCOUNTER — Other Ambulatory Visit: Payer: Self-pay

## 2019-04-25 ENCOUNTER — Ambulatory Visit (INDEPENDENT_AMBULATORY_CARE_PROVIDER_SITE_OTHER): Payer: Medicare Other | Admitting: Pulmonary Disease

## 2019-04-25 VITALS — BP 128/68 | HR 83 | Temp 98.0°F | Ht 65.0 in | Wt 106.4 lb

## 2019-04-25 DIAGNOSIS — Z23 Encounter for immunization: Secondary | ICD-10-CM | POA: Diagnosis not present

## 2019-04-25 DIAGNOSIS — J449 Chronic obstructive pulmonary disease, unspecified: Secondary | ICD-10-CM | POA: Insufficient documentation

## 2019-04-25 DIAGNOSIS — R918 Other nonspecific abnormal finding of lung field: Secondary | ICD-10-CM | POA: Diagnosis not present

## 2019-04-25 DIAGNOSIS — J439 Emphysema, unspecified: Secondary | ICD-10-CM | POA: Insufficient documentation

## 2019-04-25 DIAGNOSIS — J432 Centrilobular emphysema: Secondary | ICD-10-CM | POA: Diagnosis not present

## 2019-04-25 MED ORDER — BUDESONIDE-FORMOTEROL FUMARATE 160-4.5 MCG/ACT IN AERO: 2.0000 | INHALATION_SPRAY | Freq: Two times a day (BID) | RESPIRATORY_TRACT | 0 refills | Status: DC

## 2019-04-25 MED ORDER — MOMETASONE FURO-FORMOTEROL FUM 200-5 MCG/ACT IN AERO: 2.0000 | INHALATION_SPRAY | Freq: Two times a day (BID) | RESPIRATORY_TRACT | 6 refills | Status: DC

## 2019-04-25 NOTE — Patient Instructions (Addendum)
Unintentional weight loss, tree-in-bud opacities We need to rule out possible mycobacterium infection.   --Obtain sputum sample --Repeat CT Chest end of November --Will need to consider scheduling bronchoscopy if your symptoms worsen  Follow-up with me after CT Chest  Emphysema --CONTINUE Spiriva handihaler 1 inhalation daily --START Symbicort two puffs twice a day --CONTINUE Albuterol as needed    Smoking Tobacco Information, Adult Smoking tobacco can be harmful to your health. Tobacco contains a poisonous (toxic), colorless chemical called nicotine. Nicotine is addictive. It changes the brain and can make it hard to stop smoking. Tobacco also has other toxic chemicals that can hurt your body and raise your risk of many cancers. How can smoking tobacco affect me? Smoking tobacco puts you at risk for:  Cancer. Smoking is most commonly associated with lung cancer, but can also lead to cancer in other parts of the body.  Chronic obstructive pulmonary disease (COPD). This is a long-term lung condition that makes it hard to breathe. It also gets worse over time.  High blood pressure (hypertension), heart disease, stroke, or heart attack.  Lung infections, such as pneumonia.  Cataracts. This is when the lenses in the eyes become clouded.  Digestive problems. This may include peptic ulcers, heartburn, and gastroesophageal reflux disease (GERD).  Oral health problems, such as gum disease and tooth loss.  Loss of taste and smell. Smoking can affect your appearance by causing:  Wrinkles.  Yellow or stained teeth, fingers, and fingernails. Smoking tobacco can also affect your social life, because:  It may be challenging to find places to smoke when away from home. Many workplaces, [employer], hotels, and public places are tobacco-free.  Smoking is expensive. This is due to the cost of tobacco and the long-term costs of treating health problems from smoking.  Secondhand smoke may  affect those around you. Secondhand smoke can cause lung cancer, breathing problems, and heart disease. Children of smokers have a higher risk for: ? Sudden infant death syndrome (SIDS). ? Ear infections. ? Lung infections. If you currently smoke tobacco, quitting now can help you:  Lead a longer and healthier life.  Look, smell, breathe, and feel better over time.  Save money.  Protect others from the harms of secondhand smoke. What actions can I take to prevent health problems? Quit smoking   Do not start smoking. Quit if you already do.  Make a plan to quit smoking and commit to it. Look for programs to help you and ask your health care provider for recommendations and ideas.  Set a date and write down all the reasons you want to quit.  Let your friends and family know you are quitting so they can help and support you. Consider finding friends who also want to quit. It can be easier to quit with someone else, so that you can support each other.  Talk with your health care provider about using nicotine replacement medicines to help you quit, such as gum, lozenges, patches, sprays, or pills.  Do not replace cigarette smoking with electronic cigarettes, which are commonly called e-cigarettes. The safety of e-cigarettes is not known, and some may contain harmful chemicals.  If you try to quit but return to smoking, stay positive. It is common to slip up when you first quit, so take it one day at a time.  Be prepared for cravings. When you feel the urge to smoke, chew gum or suck on hard candy. Lifestyle  Stay busy and take care of your body.  Drink enough fluid to keep your urine pale yellow.  Get plenty of exercise and eat a healthy diet. This can help prevent weight gain after quitting.  Monitor your eating habits. Quitting smoking can cause you to have a larger appetite than when you smoke.  Find ways to relax. Go out with friends or family to a movie or a restaurant where  people do not smoke.  Ask your health care provider about having regular tests (screenings) to check for cancer. This may include blood tests, imaging tests, and other tests.  Find ways to manage your stress, such as meditation, yoga, or exercise. Where to find support To get support to quit smoking, consider:  Asking your health care provider for more information and resources.  Taking classes to learn more about quitting smoking.  Looking for local organizations that offer resources about quitting smoking.  Joining a support group for people who want to quit smoking in your local community.  Calling the smokefree.gov counselor helpline: 1-800-Quit-Now (984) 640-3740) Where to find more information You may find more information about quitting smoking from:  HelpGuide.org: www.helpguide.org  https://hall.com/: smokefree.gov  American Lung Association: www.lung.org Contact a health care provider if you:  Have problems breathing.  Notice that your lips, nose, or fingers turn blue.  Have chest pain.  Are coughing up blood.  Feel faint or you pass out.  Have other health changes that cause you to worry. Summary  Smoking tobacco can negatively affect your health, the health of those around you, your finances, and your social life.  Do not start smoking. Quit if you already do. If you need help quitting, ask your health care provider.  Think about joining a support group for people who want to quit smoking in your local community. There are many effective programs that will help you to quit this behavior. This information is not intended to replace advice given to you by your health care provider. Make sure you discuss any questions you have with your health care provider. Document Released: 07/29/2016 Document Revised: 09/02/2017 Document Reviewed: 07/29/2016 Elsevier Patient Education  2020 [employer].

## 2019-04-25 NOTE — Progress Notes (Signed)
Subjective:   PATIENT ID: Brandi Bean GENDER: female DOB: Dec 17, 1950, MRN: [id number]   HPI  Chief Complaint  Patient presents with  . Follow-up   Reason for Visit: Follow-up bronchiectasis, worsening CT  [name] is a 68 year old female active smoker with mild emphysema who presents for follow-up of CT.  During her annual lung screening evaluation, she was noted to have interval development of tree-in-bud opacities in the lower left lung. She reported productive cough and dyspnea on follow-up telephone visit so she was given a 7 day course of doxycycline. Since then she reports she has had chronic cough with yellow sputum has somewhat improved with antibiotics and mucinex. She has a chronic smoker cough. She is working on quitting. She is compliant with her Spiriva and uses Albuterol twice a day. She feels her COPD limits some of activity but still able to shop and run errands. She has shortness of breath with exertion and daily sputum production. Denies wheezes.  She has recent unintentional weight loss of 35lbs in last year that is being worked up for hyperthyroidism. She is seeing an endocrinologist in November for repeat labs. 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 She reports baseline anxiety. Her husband makes her eat three meals a day and is working on weight gain.  Social History: Currently smokes 3/4 pack per day.  I have personally reviewed patient's past medical/family/social history, allergies, current medications.  Past Medical History:  Diagnosis Date  . Anxiety   . Arthritis   . Back pain   . COPD (chronic obstructive pulmonary disease) ([place])   . Depression   . Dyspnea    W/ EXERTION+   WHEEZING  . Fibromyalgia   . Headache   . Hypertension   . PONV (postoperative nausea and vomiting)   . Sleep apnea    ?   MILD NO MACHINE ORDERED, TO BE RETESTED AT HOME AFTER SURGERY  . Stroke [facility])    MINI STROKE   15-20  YRS AGO  . UTI (urinary tract infection)      No family history on file.   Social History   Occupational History  . Not on file  Tobacco Use  . Smoking status: Current Every Day Smoker    Packs/day: 1.00    Years: 28.00    Pack years: 28.00    Types: Cigarettes    Start date: 66  . Smokeless tobacco: Never Used  . Tobacco comment: 04/25/19- 0.5pack per day /01/31/16 - 1PPD  Substance and Sexual Activity  . Alcohol use: No    Alcohol/week: 0.0 standard drinks  . Drug use: No  . Sexual activity: Not on file    Allergies  Allergen Reactions  . Codeine Sulfate Itching  . Hydrocodone Other (See Comments)    Passes out   . Latex Itching  . Prednisone Other (See Comments)    Unknown       Outpatient Medications Prior to Visit  Medication Sig Dispense Refill  . albuterol (PROVENTIL HFA;VENTOLIN HFA) 108 (90 BASE) MCG/ACT inhaler Inhale 2 puffs into the lungs every 6 (six) hours as needed for wheezing or shortness of breath.     Marland Kitchen atorvastatin (LIPITOR) 20 MG tablet Take 20 mg by mouth daily at 6 PM.    . bisoprolol-hydrochlorothiazide (ZIAC) 10-6.25 MG per tablet Take 1 tablet by mouth every evening.     Marland Kitchen guaifenesin (ROBITUSSIN) 100 MG/5ML syrup Take 200 mg  by mouth at bedtime as needed for cough.    Marland Kitchen ibuprofen (ADVIL,MOTRIN) 200 MG tablet Take 400 mg by mouth every 8 (eight) hours as needed for mild pain.    Marland Kitchen lidocaine (LIDODERM) 5 % Place 1 patch onto the skin daily as needed (pain). Remove & Discard patch within 12 hours or as directed by MD    . oxyCODONE-acetaminophen (PERCOCET) 10-325 MG tablet Take 1-2 tablets by mouth every 4 (four) hours as needed for pain. (Patient taking differently: Take 1 tablet by mouth 2 (two) times daily as needed for pain. ) 60 tablet 0  . SPIRIVA HANDIHALER 18 MCG inhalation capsule INHALE ONE PUFF ONCE DAILY. 30 capsule 11  . doxycycline (VIBRA-TABS) 100 MG tablet Take 1 tablet (100 mg total) by mouth 2 (two) times daily. (Patient not  taking: Reported on 04/25/2019) 14 tablet 0   No facility-administered medications prior to visit.     Review of Systems  Constitutional: Positive for weight loss. Negative for chills, diaphoresis, fever and malaise/fatigue.  HENT: Positive for congestion. Negative for ear pain and sore throat.   Respiratory: Positive for cough, sputum production and wheezing. Negative for hemoptysis and shortness of breath.   Cardiovascular: Negative for chest pain, palpitations and leg swelling.  Gastrointestinal: Negative for abdominal pain, heartburn and nausea.  Genitourinary: Negative for frequency.  Musculoskeletal: Positive for joint pain and myalgias.  Skin: Negative for itching and rash.  Neurological: Positive for headaches. Negative for dizziness and weakness.  Endo/Heme/Allergies: Bruises/bleeds easily.  Psychiatric/Behavioral: Negative for depression. The patient is nervous/anxious.      Objective:   Vitals:   04/25/19 1024  BP: 128/68  Pulse: 83  Temp: 98 F (36.7 C)  TempSrc: Temporal  SpO2: 95%  Weight: 106 lb 6.4 oz (48.3 kg)  Height: 5\' 5"  (1.651 m)   SpO2: 95 % O2 Device: None (Room air)  Physical Exam: General: Well-appearing, no acute distress HENT: [place], AT, OP clear, MMM Eyes: EOMI, no scleral icterus Respiratory: Mild expiratory wheezes bilaterally Cardiovascular: RRR, -M/R/G, no JVD GI: BS+, soft, nontender Extremities:-Edema,-tenderness Neuro: AAO x4, CNII-XII grossly intact Skin: Intact, no rashes or bruising Psych: Normal mood, normal affect  Data Reviewed:  Imaging: CT 03/23/19 - Mild bronchiectasis with interval development of tree-in-bud opacities in the mid-lower left lung. Mild emphysema. Stable sub-centimeter lung nodules including in RUL  PFT: 02/07/14 FVC 1.9 (61%) FEV1 1.4 (58%) Ratio 74  Interpretation: No evidence of obstruction. Reduced FEV1 and FVC may suggest poor effort or restrictive defect  Labs: CBC    Component Value Date/Time    WBC 11.9 (H) 02/18/2017 1350   RBC 5.67 (H) 02/18/2017 1350   HGB 17.7 (H) 02/18/2017 1350   HCT 51.6 (H) 02/18/2017 1350   PLT 246 02/18/2017 1350   MCV 91.0 02/18/2017 1350   MCH 31.2 02/18/2017 1350   MCHC 34.3 02/18/2017 1350   RDW 13.1 02/18/2017 1350   LYMPHSABS 4.2 (H) 02/18/2017 1350   MONOABS 0.5 02/18/2017 1350   EOSABS 0.1 02/18/2017 1350   BASOSABS 0.0 02/18/2017 1350   BMET    Component Value Date/Time   NA 135 02/18/2017 1350   K 3.3 (L) 02/18/2017 1350   CL 97 (L) 02/18/2017 1350   CO2 27 02/18/2017 1350   GLUCOSE 182 (H) 02/18/2017 1350   BUN 12 02/18/2017 1350   CREATININE 1.07 (H) 02/18/2017 1350   CALCIUM 9.4 02/18/2017 1350   GFRNONAA 52 (L) 02/18/2017 1350   GFRAA >60 02/18/2017 1350  Imaging, labs and tests noted above have been reviewed independently by me.    Assessment & Plan:   Discussion: 68 year old female with active smoker with mild emphysema who presents with cough, SOB and recent unintentional weight loss. She attributes weight loss to thyroid issues and is awaiting final work-up next month. Her respiratory symptoms and CT changes may suggest atypical infection. Pending her next CT scan and sputum cultures, she may warrant further evaluation with bronchoscopy. Will also optimize her COPD bronchodilator regimen as she does not clinically seem improved with multiple uses of albuterol daily.  Unintentional weight loss, tree-in-bud opacities, mild bronchiectasis We need to rule out possible mycobacterium infection.  --Obtain sputum sample (respiratory, [employer]) --Repeat CT Chest end of November --Will need to consider scheduling bronchoscopy if your symptoms worsen  Emphysema --CONTINUE Spiriva handihaler 1 inhalation daily --START Symbicort two puffs twice a day --CONTINUE Albuterol as needed  Tobacco abuse Patient is an active smoker. We discussed smoking cessation for 5 minutes. We discussed triggers and stressors and ways to deal with  them. We discussed barriers to continued smoking and benefits of smoking cessation. Provided patient with information cessation techniques and interventions including [place] quitline.   Health Maintenance Immunization History  Administered Date(s) Administered  . Influenza Split 05/28/2012, 08/28/2014  . Influenza Whole 05/14/2009, 04/07/2011  . Pneumococcal Conjugate-13 02/19/2015  . Pneumococcal Polysaccharide-23 05/28/2012   CT Lung Screen - Due 02/2020  Orders Placed This Encounter  Procedures  .  MYCOBACTERIA, CULTURE, WITH FLUOROCHROME SMEAR    Standing Status:   Future    Standing Expiration Date:   04/24/2020  . Fungus Culture with Smear    Standing Status:   Future    Standing Expiration Date:   04/24/2020  . Respiratory or Resp and Sputum Culture    Standing Status:   Future    Standing Expiration Date:   04/24/2020  . CT Chest Wo Contrast    Please schedule end of November. Prior to follow up with JE    Standing Status:   Future    Standing Expiration Date:   06/24/2020    Order Specific Question:   ** REASON FOR EXAM (FREE TEXT)    Answer:   tree-in-bud opacities    Order Specific Question:   Preferred imaging location?    Answer:   [place]    Order Specific Question:   Radiology Contrast Protocol - do NOT remove file path    Answer:   [email or web address]  . Flu Vaccine QUAD High Dose(Fluad)   Meds ordered this encounter  Medications  . budesonide-formoterol (SYMBICORT) 160-4.5 MCG/ACT inhaler    Sig: Inhale 2 puffs into the lungs 2 (two) times daily.    Dispense:  1 Inhaler    Refill:  0    Order Specific Question:   Lot Number?    [id number]    Order Specific Question:   Expiration Date?    Answer:   01/26/2020    Order Specific Question:   Manufacturer?    Answer:   [employer] [[id number]]    Order Specific Question:   Quantity    Answer:   1  . mometasone-formoterol (DULERA) 200-5 MCG/ACT AERO    Sig: Inhale 2  puffs into the lungs 2 (two) times daily.    Dispense:  8.8 g    Refill:  6   Follow-up with me after CT Chest  Return after CT Chest.  Davion Meara Rodman Pickle, MD  [facility] 04/25/2019 11:04 AM  Office Number [phone]

## 2019-04-27 ENCOUNTER — Other Ambulatory Visit: Payer: Medicare Other

## 2019-04-27 DIAGNOSIS — J432 Centrilobular emphysema: Secondary | ICD-10-CM | POA: Diagnosis not present

## 2019-05-02 NOTE — Progress Notes (Signed)
Please contact patient regarding positive culture results. Based on sensitivities, start Levofloxacin 500mg  daily for seven days.

## 2019-05-05 ENCOUNTER — Other Ambulatory Visit: Payer: Self-pay | Admitting: *Deleted

## 2019-05-05 MED ORDER — LEVOFLOXACIN 500 MG PO TABS: 500.0000 mg | ORAL_TABLET | Freq: Every day | ORAL | 0 refills | Status: AC

## 2019-05-09 DIAGNOSIS — E0590 Thyrotoxicosis, unspecified without thyrotoxic crisis or storm: Secondary | ICD-10-CM | POA: Diagnosis not present

## 2019-05-09 DIAGNOSIS — J449 Chronic obstructive pulmonary disease, unspecified: Secondary | ICD-10-CM | POA: Diagnosis not present

## 2019-05-09 DIAGNOSIS — G894 Chronic pain syndrome: Secondary | ICD-10-CM | POA: Diagnosis not present

## 2019-05-09 DIAGNOSIS — I10 Essential (primary) hypertension: Secondary | ICD-10-CM | POA: Diagnosis not present

## 2019-05-28 DIAGNOSIS — E7849 Other hyperlipidemia: Secondary | ICD-10-CM | POA: Diagnosis not present

## 2019-05-28 DIAGNOSIS — J449 Chronic obstructive pulmonary disease, unspecified: Secondary | ICD-10-CM | POA: Diagnosis not present

## 2019-05-28 DIAGNOSIS — I10 Essential (primary) hypertension: Secondary | ICD-10-CM | POA: Diagnosis not present

## 2019-06-13 ENCOUNTER — Telehealth: Payer: Self-pay | Admitting: Pulmonary Disease

## 2019-06-13 ENCOUNTER — Other Ambulatory Visit: Payer: Medicare Other

## 2019-06-13 DIAGNOSIS — R946 Abnormal results of thyroid function studies: Secondary | ICD-10-CM | POA: Diagnosis not present

## 2019-06-13 LAB — MYCOBACTERIA,CULT W/FLUOROCHROME SMEAR
MICRO NUMBER:: 939238
SMEAR:: NONE SEEN
SPECIMEN QUALITY:: ADEQUATE

## 2019-06-13 LAB — FUNGUS CULTURE W SMEAR
MICRO NUMBER:: 939237
SMEAR:: NONE SEEN
SPECIMEN QUALITY:: ADEQUATE

## 2019-06-13 LAB — RESPIRATORY CULTURE OR RESPIRATORY AND SPUTUM CULTURE
MICRO NUMBER:: 939239
SPECIMEN QUALITY:: ADEQUATE

## 2019-06-13 NOTE — Telephone Encounter (Addendum)
Checked our samples to see if we had any samples of Dulera and there are none in the sample closet.   Sent a message through teams to [facility] to let her know and she said that Ashley Akin, RN was outside talking to pt. Nothing further needed.

## 2019-06-19 ENCOUNTER — Encounter: Payer: Self-pay | Admitting: Pulmonary Disease

## 2019-06-20 ENCOUNTER — Other Ambulatory Visit: Payer: Self-pay

## 2019-06-20 ENCOUNTER — Ambulatory Visit (HOSPITAL_COMMUNITY)
Admission: RE | Admit: 2019-06-20 | Discharge: 2019-06-20 | Disposition: A | Discharge status: Home / Self Care | Payer: Medicare Other | Source: Ambulatory Visit | Attending: Pulmonary Disease | Admitting: Pulmonary Disease

## 2019-06-20 DIAGNOSIS — R918 Other nonspecific abnormal finding of lung field: Secondary | ICD-10-CM | POA: Diagnosis not present

## 2019-06-20 DIAGNOSIS — J984 Other disorders of lung: Secondary | ICD-10-CM | POA: Diagnosis not present

## 2019-06-27 ENCOUNTER — Other Ambulatory Visit: Payer: Self-pay

## 2019-06-27 ENCOUNTER — Ambulatory Visit (HOSPITAL_COMMUNITY): Payer: Medicare Other

## 2019-06-27 ENCOUNTER — Encounter: Payer: Self-pay | Admitting: Pulmonary Disease

## 2019-06-27 ENCOUNTER — Ambulatory Visit (INDEPENDENT_AMBULATORY_CARE_PROVIDER_SITE_OTHER): Payer: Medicare Other | Admitting: Pulmonary Disease

## 2019-06-27 DIAGNOSIS — J432 Centrilobular emphysema: Secondary | ICD-10-CM

## 2019-06-27 DIAGNOSIS — J449 Chronic obstructive pulmonary disease, unspecified: Secondary | ICD-10-CM | POA: Diagnosis not present

## 2019-06-27 DIAGNOSIS — I10 Essential (primary) hypertension: Secondary | ICD-10-CM | POA: Diagnosis not present

## 2019-06-27 DIAGNOSIS — Z720 Tobacco use: Secondary | ICD-10-CM

## 2019-06-27 DIAGNOSIS — E785 Hyperlipidemia, unspecified: Secondary | ICD-10-CM | POA: Diagnosis not present

## 2019-06-27 DIAGNOSIS — R918 Other nonspecific abnormal finding of lung field: Secondary | ICD-10-CM | POA: Diagnosis not present

## 2019-06-27 NOTE — Progress Notes (Signed)
Virtual Visit via Telephone Note  I connected with [name] on 06/27/19 at 10:00 AM EST by telephone and verified that I am speaking with the correct person using two identifiers.  Location: Patient: Home Provider: [facility]   I discussed the limitations, risks, security and privacy concerns of performing an evaluation and management service by telephone and the availability of in person appointments. I also discussed with the patient that there may be a patient responsible charge related to this service. The patient expressed understanding and agreed to proceed.   I discussed the assessment and treatment plan with the patient. The patient was provided an opportunity to ask questions and all were answered. The patient agreed with the plan and demonstrated an understanding of the instructions.   The patient was advised to call back or seek an in-person evaluation if the symptoms worsen or if the condition fails to improve as anticipated.  I provided 25 minutes of non-face-to-face time during this encounter.   Edelyn Heidel Rodman Pickle, MD   Subjective:   PATIENT ID: [name] GENDER: female DOB: February 21, 1951, MRN: [id number]   HPI  Chief Complaint  Patient presents with  . Follow-up    doing better, on 2 inhalers daily uses albuterol 1-2 times a day    Reason for Visit: Follow-up bronchiectasis, worsening CT  Ms. Henly Kuechenmeister is a 68 year old female active smoker with mild emphysema who presents for follow-up of CT.  Since our last visit, she was diagnosed with Pseudomonas and Streptoccoccus pneumonia based on sputum culture. She completed a course of Levaquin with improvement. She is currently [place] and Spiriva and feels it has helped. Her shortness of breath and productive cough with yellow sputum have improved. Wheezing has completely resolved. Rarely uses her albuterol. She is able to shop and run errands without respiratory issues though limited to back problems. She  also has cut back on smoking 1/2 ppd. She reports her weight has been stable and following Endocrine for her thyroid issues.  Social History: Currently smokes 1/2 pack per day.  I have personally reviewed patient's past medical/family/social history/allergies/current medications.  Past Medical History:  Diagnosis Date  . Anxiety   . Arthritis   . Back pain   . COPD (chronic obstructive pulmonary disease) ([place])   . Depression   . Dyspnea    W/ EXERTION+   WHEEZING  . Fibromyalgia   . Headache   . Hypertension   . PONV (postoperative nausea and vomiting)   . Sleep apnea    ?   MILD NO MACHINE ORDERED, TO BE RETESTED AT HOME AFTER SURGERY  . Stroke [facility])    MINI STROKE   15-20 YRS AGO  . UTI (urinary tract infection)      No family history on file.   Social History   Occupational History  . Not on file  Tobacco Use  . Smoking status: Current Every Day Smoker    Packs/day: 1.00    Years: 28.00    Pack years: 28.00    Types: Cigarettes    Start date: 86  . Smokeless tobacco: Never Used  . Tobacco comment: 06/27/19 -1/2 pack a day  Substance and Sexual Activity  . Alcohol use: No    Alcohol/week: 0.0 standard drinks  . Drug use: No  . Sexual activity: Not on file    Allergies  Allergen Reactions  . Codeine Sulfate Itching  . Hydrocodone Other (See Comments)    Passes out   .  Latex Itching  . Prednisone Other (See Comments)    Unknown       Outpatient Medications Prior to Visit  Medication Sig Dispense Refill  . albuterol (PROVENTIL HFA;VENTOLIN HFA) 108 (90 BASE) MCG/ACT inhaler Inhale 2 puffs into the lungs every 6 (six) hours as needed for wheezing or shortness of breath.     Marland Kitchen atorvastatin (LIPITOR) 20 MG tablet Take 20 mg by mouth daily at 6 PM.    . bisoprolol-hydrochlorothiazide (ZIAC) 10-6.25 MG per tablet Take 1 tablet by mouth every evening.     Marland Kitchen guaifenesin (ROBITUSSIN) 100 MG/5ML syrup Take 200 mg by mouth at bedtime as needed for cough.    Marland Kitchen  ibuprofen (ADVIL,MOTRIN) 200 MG tablet Take 400 mg by mouth every 8 (eight) hours as needed for mild pain.    Marland Kitchen lidocaine (LIDODERM) 5 % Place 1 patch onto the skin daily as needed (pain). Remove & Discard patch within 12 hours or as directed by MD    . mometasone-formoterol (DULERA) 200-5 MCG/ACT AERO Inhale 2 puffs into the lungs 2 (two) times daily. 8.8 g 6  . oxyCODONE-acetaminophen (PERCOCET) 10-325 MG tablet Take 1-2 tablets by mouth every 4 (four) hours as needed for pain. (Patient taking differently: Take 1 tablet by mouth 2 (two) times daily as needed for pain. ) 60 tablet 0  . SPIRIVA HANDIHALER 18 MCG inhalation capsule INHALE ONE PUFF ONCE DAILY. 30 capsule 11  . budesonide-formoterol (SYMBICORT) 160-4.5 MCG/ACT inhaler Inhale 2 puffs into the lungs 2 (two) times daily. (Patient not taking: Reported on 06/27/2019) 1 Inhaler 0   No facility-administered medications prior to visit.     Review of Systems  Constitutional: Negative for chills, diaphoresis, fever, malaise/fatigue and weight loss.  HENT: Negative for congestion.   Respiratory: Positive for cough, sputum production and shortness of breath. Negative for hemoptysis and wheezing.   Cardiovascular: Negative for chest pain, palpitations and leg swelling.     Objective:   There were no vitals filed for this visit.    Physical Exam: No distress on the phone. Able to speak full sentences. No audible wheezing.  Data Reviewed:  Imaging: CT 03/23/19 - Mild bronchiectasis with interval development of tree-in-bud opacities in the mid-lower left lung. Mild emphysema. Stable sub-centimeter lung nodules including in RUL  CT Chest 06/20/19 - Slightly improved tree-in-bud opacities in LLL. Some mucous impaction in LLL. Findings may suggestive of aspiration. Mild centrilobular emphysema  PFT: 02/07/14 FVC 1.9 (61%) FEV1 1.4 (58%) Ratio 74  Interpretation: No evidence of obstruction. Reduced FEV1 and FVC may suggest poor effort or  restrictive defect  Micro: Sputum 04/27/19 - Pseudomonas aeruginosa, streptococcus Fungal 04/27/19- Aspergillus [place] [employer] 04/27/19 - Neg  Imaging, labs and test noted above have been reviewed independently by me.    Assessment & Plan:   Discussion: 68 year old female active smoker with mild emphysema who presents for follow-up. Recently evaluated for abnormal CT. Repeat imaging today is slightly improved after antibiotics targeted for Pseudomonas and Streptococcus pneumonia. She reports symptoms have improved on bronchodilators and rarely uses her albuterol now which is a significant change. Her CT changes are suggestive of chronic aspiration which we discussed further evaluation however patient declined as she feels her swallowing is ok. Will continue current emphysema management.  Tree-in-bud opacities CT chest findings suggestive of aspiration. Patient declines swallow evaluation.  Recommend aspiration precautions.  Emphysema, mild bronchiectasis --CONTINUE Spiriva handihaler 1 inhalation daily --CONTINUE Dulera two puffs twice a day --CONTINUE Albuterol as  needed  Tobacco abuse Patient is an active smoker. We discussed smoking cessation for 3 minutes. We discussed triggers and stressors and ways to deal with them. We discussed barriers to continued smoking and benefits of smoking cessation. Provided patient with information cessation techniques and interventions including [place] quitline.   Health Maintenance Immunization History  Administered Date(s) Administered  . Fluad Quad(high Dose 65+) 04/25/2019  . Influenza Split 05/28/2012, 08/28/2014  . Influenza Whole 05/14/2009, 04/07/2011  . Pneumococcal Conjugate-13 02/19/2015  . Pneumococcal Polysaccharide-23 05/28/2012   CT Lung Screen - Due 02/2020  No orders of the defined types were placed in this encounter.  No orders of the defined types were placed in this encounter.  Return in about 3 months (around 09/25/2019) for COPD.   [place], MD [facility] 06/27/2019 4:59 PM  Office Number [phone]

## 2019-06-27 NOTE — Patient Instructions (Signed)
Tree-in-bud opacities CT chest findings suggestive of aspiration. Patient declines swallow evaluation.  Recommend aspiration precautions. No indication for bronchoscopy  Emphysema, mild bronchiectasis --CONTINUE Spiriva handihaler 1 inhalation daily --CONTINUE Dulera two puffs twice a day --CONTINUE Albuterol as needed  Tobacco abuse Patient is an active smoker. We discussed smoking cessation for 3 minutes. We discussed triggers and stressors and ways to deal with them. We discussed barriers to continued smoking and benefits of smoking cessation. Provided patient with information cessation techniques and interventions including [place] quitline.   Aspiration Precautions, Adult Aspiration is the breathing in (inhalation) of a liquid or object into the lungs. Things that can be inhaled into the lungs include:  Food.  Any type of liquid, such as drinks or saliva.  Stomach contents, such as vomit or stomach acid. What are the signs of aspiration? Signs of aspiration include:  Coughing after swallowing food or liquids.  Clearing the throat often while eating.  Trouble breathing. This may include: ? Breathing quickly. ? Breathing very slowly. ? Loud breathing. ? Rumbling sounds from the lungs while breathing.  Coughing up phlegm (sputum) that: ? Is yellow, tan, or green. ? Has pieces of food in it. ? Is bad-smelling.  Having a hoarse, barky cough.  Not being able to speak.  A hoarse voice.  Drooling while eating.  A feeling of fullness in the throat or a feeling that something is stuck in the throat.  Choking often.  Having a runny noise while eating.  Coughing when lying down or having to sit up quickly after lying down.  A change in skin color. The skin may look red or blue.  Fever.  Watery eyes.  Pain in the chest or back.  A pained look on the face. What are the complications of aspiration? Complications of aspiration include:  Losing weight because the  person is not absorbing needed nutrients.  Loss of enjoyment and the social benefits of eating.  Choking.  Lung irritation, if someone aspirates acidic food or drinks.  Lung infection (pneumonia).  Collection of infected liquid (pus) in the lungs (lung abscess). In serious cases, death can occur. What can I do to prevent aspiration? Caring for someone who has a feeding tube If you are caring for someone who has a feeding tube who cannot eat or drink safely through his or her mouth:  Keep the person in an upright position as much as possible.  Do not lay the person flat if he or she is getting continuous feedings. If you need to lay the person flat for any reason, turn the feeding pump off.  Check feeding tube residuals as told by your health care provider. Ask your health care provider what residual amount is too high. Caring for someone who can eat and drink safely by mouth If you are caring for someone who can eat and drink safely through his or her mouth:  Have the person sit in an upright position when eating food or drinking fluids. This can be done in two ways: ? Have the person sit up in a chair. ? If sitting in a chair is not possible, position the person in bed so he or she is upright.  Remind the person to eat slowly and chew well. Make sure the person is awake and alert while eating.  Do not distract the person. This is especially important for people with thinking or memory (cognitive) problems.  Allow foods to cool. Hot foods may be more difficult to swallow.  Provide small meals more frequently, instead of 3 large meals. This may reduce fatigue during eating.  Check the person's mouth thoroughly for leftover food after eating.  Keep the person sitting upright for 30-45 minutes after eating.  Do not serve food or drink during 2 hours or more before bedtime. General instructions Follow these general guidelines to prevent aspiration in someone who can eat and  drink safely by mouth:  Never put food or liquids in the mouth of a person who is not fully alert.  Feed small amounts of food. Do not force feed.  For a person who is on a diet for swallowing difficulty (dysphagia diet), follow the recommended food and drink consistency. For example, in dysphagia diet level 1, thicken liquids to pudding-like consistency.  Use as little water as possible when brushing the person's teeth or cleaning his or her mouth.  Provide oral care before and after meals.  Use adaptive devices such as cut-out cups, straws, or utensils as told by the health care provider.  Crush pills and put them in soft food such as pudding or ice cream. Some pills should not be crushed. Check with the health care provider before crushing any medicine. Contact a health care provider if:  The person has a feeding tube, and the feeding tube residual amount is too high.  The person has a fever.  The person tries to avoid food or water, such as refusing to eat, drink, or be fed, or is eating less than normal.  The person may have aspirated food or liquid.  You notice warning signs, such as choking or coughing, when the person eats or drinks. Get help right away if:  The person has trouble breathing or starts to breathe quickly.  The person is breathing very slowly or stops breathing.  The person coughs a lot after eating or drinking.  The person has a long-lasting (chronic) cough.  The person coughs up thick, yellow, or tan sputum.  If someone is choking on food or an object, perform the Heimlich maneuver (abdominal thrusts).  The person has symptoms of pneumonia, such as: ? Coughing a lot. ? Coughing up mucus with a bad smell or blood in it. ? Feeling short of breath. ? Complaining of chest pain. ? Sweating, fever, and chills. ? Feeling tired. ? Complaining of trouble breathing. ? Wheezing.  The person cannot stop choking.  The person is unable to breathe, turns  blue, faints, or seems confused. These symptoms may represent a serious problem that is an emergency. Do not wait to see if the symptoms will go away. Get medical help right away. Call your local emergency services (911 in the [place]  Summary  Aspiration is the breathing in (inhalation) of a liquid or object into the lungs. Things that can be inhaled into the lungs include food, liquids, saliva, or stomach contents.  Aspiration can cause pneumonia or choking.  One sign of aspiration is coughing after swallowing food or liquids.  Contact a health care provider if you notice signs of aspiration. This information is not intended to replace advice given to you by your health care provider. Make sure you discuss any questions you have with your health care provider. Document Released: 08/16/2010 Document Revised: 06/26/2017 Document Reviewed: 04/10/2016 Elsevier Patient Education  2020 [employer].

## 2019-07-28 DIAGNOSIS — I10 Essential (primary) hypertension: Secondary | ICD-10-CM | POA: Diagnosis not present

## 2019-07-28 DIAGNOSIS — G894 Chronic pain syndrome: Secondary | ICD-10-CM | POA: Diagnosis not present

## 2019-07-28 DIAGNOSIS — J449 Chronic obstructive pulmonary disease, unspecified: Secondary | ICD-10-CM | POA: Diagnosis not present

## 2019-08-17 DIAGNOSIS — Z0001 Encounter for general adult medical examination with abnormal findings: Secondary | ICD-10-CM | POA: Diagnosis not present

## 2019-08-17 DIAGNOSIS — Z681 Body mass index (BMI) 19 or less, adult: Secondary | ICD-10-CM | POA: Diagnosis not present

## 2019-08-17 DIAGNOSIS — M1991 Primary osteoarthritis, unspecified site: Secondary | ICD-10-CM | POA: Diagnosis not present

## 2019-08-17 DIAGNOSIS — E785 Hyperlipidemia, unspecified: Secondary | ICD-10-CM | POA: Diagnosis not present

## 2019-08-17 DIAGNOSIS — I10 Essential (primary) hypertension: Secondary | ICD-10-CM | POA: Diagnosis not present

## 2019-08-17 DIAGNOSIS — Z0000 Encounter for general adult medical examination without abnormal findings: Secondary | ICD-10-CM | POA: Diagnosis not present

## 2019-08-17 DIAGNOSIS — E039 Hypothyroidism, unspecified: Secondary | ICD-10-CM | POA: Diagnosis not present

## 2019-08-17 DIAGNOSIS — E782 Mixed hyperlipidemia: Secondary | ICD-10-CM | POA: Diagnosis not present

## 2019-08-17 DIAGNOSIS — Z1389 Encounter for screening for other disorder: Secondary | ICD-10-CM | POA: Diagnosis not present

## 2019-08-17 DIAGNOSIS — G894 Chronic pain syndrome: Secondary | ICD-10-CM | POA: Diagnosis not present

## 2019-08-17 DIAGNOSIS — J449 Chronic obstructive pulmonary disease, unspecified: Secondary | ICD-10-CM | POA: Diagnosis not present

## 2019-08-18 ENCOUNTER — Telehealth: Payer: Self-pay | Admitting: Pulmonary Disease

## 2019-08-18 NOTE — Telephone Encounter (Signed)
Called patient. She wishes to switch from [place] and Spiriva to Trelegy. I will reduce steroid dose and hopefully this will help with side effects she experiencing. I will see her during follow-up visit in Feb to see how she is tolerating the inhaler.  Plan: Staff, please order Trelegy 100-62.5-25 ONE puff ONCE a day with [facility]. Inhaler order is pended.

## 2019-08-18 NOTE — Telephone Encounter (Signed)
Dr. Loanne Drilling,  When I called the patient to confirm she wanted the [facility] sent to [facility], the patient said since she when she started using it (issued 04/25/19), the medication has made the inside of her mouth and middle of her tongue red and sore.   The patient confirmed she does rinse her mouth out after each use of Dulera. Does not have any complaints or issued with Spiriva or the albuterol inhaler.  She had a televisit with you on 06/27/19.  Please advise of recommendations, thank you much.

## 2019-08-19 ENCOUNTER — Telehealth: Payer: Self-pay | Admitting: Pulmonary Disease

## 2019-08-19 MED ORDER — TRELEGY ELLIPTA 100-62.5-25 MCG/INH IN AEPB: 1.0000 | INHALATION_SPRAY | Freq: Every day | RESPIRATORY_TRACT | 3 refills | Status: DC

## 2019-08-19 NOTE — Telephone Encounter (Signed)
[facility] called back to confirm that Dr. Loanne Drilling wants her on Trelegy and not on Spiriva and [facility] - Please advise CB# [phone]

## 2019-08-19 NOTE — Telephone Encounter (Signed)
Spoke with [facility] and verified that Dr. Loanne Drilling did want her on Trelegy. Nothing further is needed.

## 2019-08-19 NOTE — Telephone Encounter (Signed)
Rx sent to [facility]. Nothing further is needed.

## 2019-08-28 DIAGNOSIS — I10 Essential (primary) hypertension: Secondary | ICD-10-CM | POA: Diagnosis not present

## 2019-08-28 DIAGNOSIS — G894 Chronic pain syndrome: Secondary | ICD-10-CM | POA: Diagnosis not present

## 2019-08-28 DIAGNOSIS — E7849 Other hyperlipidemia: Secondary | ICD-10-CM | POA: Diagnosis not present

## 2019-09-14 ENCOUNTER — Other Ambulatory Visit: Payer: Self-pay

## 2019-09-14 ENCOUNTER — Ambulatory Visit: Payer: Medicare Other | Admitting: Pulmonary Disease

## 2019-09-14 ENCOUNTER — Encounter: Payer: Self-pay | Admitting: Pulmonary Disease

## 2019-09-14 VITALS — BP 108/62 | HR 86 | Temp 97.7°F | Ht 62.5 in | Wt 106.2 lb

## 2019-09-14 DIAGNOSIS — Z720 Tobacco use: Secondary | ICD-10-CM

## 2019-09-14 DIAGNOSIS — F17210 Nicotine dependence, cigarettes, uncomplicated: Secondary | ICD-10-CM | POA: Diagnosis not present

## 2019-09-14 DIAGNOSIS — J432 Centrilobular emphysema: Secondary | ICD-10-CM

## 2019-09-14 DIAGNOSIS — J441 Chronic obstructive pulmonary disease with (acute) exacerbation: Secondary | ICD-10-CM

## 2019-09-14 MED ORDER — BUPROPION HCL ER (SR) 150 MG PO TB12: 150.0000 mg | ORAL_TABLET | Freq: Two times a day (BID) | ORAL | 6 refills | Status: DC

## 2019-09-14 MED ORDER — TRELEGY ELLIPTA 200-62.5-25 MCG/INH IN AEPB: 1.0000 | INHALATION_SPRAY | Freq: Every day | RESPIRATORY_TRACT | 6 refills | Status: DC

## 2019-09-14 NOTE — Progress Notes (Signed)
Subjective:   PATIENT ID: Brandi Bean GENDER: female DOB: 11/25/1950, MRN: [id number]   HPI  Chief Complaint  Patient presents with  . Follow-up    trelegy    Reason for Visit: Follow-up  [name] is a 69 year old female active smoker with emphysema who presents for follow-up.  She has been on Trelegy and reports she is overall doing well. She has not had to use her albuterol inhaler and able to perform activities without limitation. However she continues to have dyspnea on exertion and wheezing daily. She is still smoking a little over 1/2 ppd. Denies any recent exacerbations outpatient or ED/hospitalizations.  Social History: Currently smokes 1/2 pack per day.  I have personally reviewed patient's past medical/family/social history/allergies/current medications. Past Medical History:  Diagnosis Date  . Anxiety   . Arthritis   . Back pain   . COPD (chronic obstructive pulmonary disease) ([place])   . Depression   . Dyspnea    W/ EXERTION+   WHEEZING  . Fibromyalgia   . Headache   . Hypertension   . PONV (postoperative nausea and vomiting)   . Sleep apnea    ?   MILD NO MACHINE ORDERED, TO BE RETESTED AT HOME AFTER SURGERY  . Stroke [facility])    MINI STROKE   15-20 YRS AGO  . UTI (urinary tract infection)     Outpatient Medications Prior to Visit  Medication Sig Dispense Refill  . albuterol (PROVENTIL HFA;VENTOLIN HFA) 108 (90 BASE) MCG/ACT inhaler Inhale 2 puffs into the lungs every 6 (six) hours as needed for wheezing or shortness of breath.     Marland Kitchen atorvastatin (LIPITOR) 20 MG tablet Take 20 mg by mouth daily at 6 PM.    . bisoprolol-hydrochlorothiazide (ZIAC) 10-6.25 MG per tablet Take 1 tablet by mouth every evening.     . Fluticasone-Umeclidin-Vilant (TRELEGY ELLIPTA) 100-62.5-25 MCG/INH AEPB Inhale 1 puff into the lungs daily. 1 each 3  . guaifenesin (ROBITUSSIN) 100 MG/5ML syrup Take 200 mg by mouth at bedtime as needed for cough.    Marland Kitchen ibuprofen  (ADVIL,MOTRIN) 200 MG tablet Take 400 mg by mouth every 8 (eight) hours as needed for mild pain.    Marland Kitchen lidocaine (LIDODERM) 5 % Place 1 patch onto the skin daily as needed (pain). Remove & Discard patch within 12 hours or as directed by MD    . oxyCODONE-acetaminophen (PERCOCET) 10-325 MG tablet Take 1-2 tablets by mouth every 4 (four) hours as needed for pain. (Patient taking differently: Take 1 tablet by mouth 2 (two) times daily as needed for pain. ) 60 tablet 0  . mometasone-formoterol (DULERA) 200-5 MCG/ACT AERO Inhale 2 puffs into the lungs 2 (two) times daily. 8.8 g 6  . SPIRIVA HANDIHALER 18 MCG inhalation capsule INHALE ONE PUFF ONCE DAILY. 30 capsule 11   No facility-administered medications prior to visit.    Review of Systems  Constitutional: Negative for chills, diaphoresis, fever, malaise/fatigue and weight loss.  HENT: Negative for congestion.   Respiratory: Negative for cough, hemoptysis, sputum production, shortness of breath and wheezing.   Cardiovascular: Negative for chest pain, palpitations and leg swelling.   Objective:   Vitals:   09/14/19 1017 09/14/19 1019  BP: 108/62   Pulse: 86   Temp:  97.7 F (36.5 C)  TempSrc:  Temporal  SpO2: 95%   Weight:  106 lb 3.2 oz (48.2 kg)  Height:  5' 2.5" (1.588 m)   SpO2: 95 %  Physical Exam:  General: Thin,-appearing, no acute distress HENT: [place], AT Eyes: EOMI, no scleral icterus Respiratory: Wheezes bilaterally Cardiovascular: RRR, -M/R/G, no JVD GI: BS+, soft, nontender Extremities:-Edema,-tenderness Neuro: AAO x4, CNII-XII grossly intact Skin: Intact, no rashes or bruising Psych: Normal mood, normal affect  Data Reviewed:  Imaging: CT 03/23/19 - Mild bronchiectasis with interval development of tree-in-bud opacities in the mid-lower left lung. Mild emphysema. Stable sub-centimeter lung nodules including in RUL  CT Chest 06/20/19 - Slightly improved tree-in-bud opacities in LLL. Some mucous impaction in LLL.  Findings may suggestive of aspiration. Mild centrilobular emphysema  PFT: 02/07/14 FVC 1.9 (61%) FEV1 1.4 (58%) Ratio 74  Interpretation: No evidence of obstruction. Reduced FEV1 and FVC may suggest poor effort or restrictive defect  Micro: Sputum 04/27/19 - Pseudomonas aeruginosa, streptococcus Fungal 04/27/19- Aspergillus [employer] [employer] 04/27/19 - Neg  Assessment & Plan:   Discussion: 69 year old female active smoker with mild emphysema who presents for follow-up. Tolerating Trelegy however still symptomatic. Discussed smoking cessation.  Emphysema, mild bronchiectasis --INCREASE Trelegy 200-62.5-25 ONE puff ONCE days --CONTINUE Albuterol as needed for shortness of breath or wheezing  Tobacco abuse Patient is an active smoker. She is interested in quitting. We discussed smoking cessation for 8 minutes. We discussed triggers and stressors and ways to deal with them. We discussed barriers to continued smoking and benefits of smoking cessation. Provided patient with information cessation techniques and interventions including [place] quitline. --START wellbutrin   Health Maintenance Immunization History  Administered Date(s) Administered  . Fluad Quad(high Dose 65+) 04/25/2019  . Influenza Split 05/28/2012, 08/28/2014  . Influenza Whole 05/14/2009, 04/07/2011  . Moderna SARS-COVID-2 Vaccination 09/02/2019  . Pneumococcal Conjugate-13 02/19/2015  . Pneumococcal Polysaccharide-23 05/28/2012   CT Lung Screen - Due 02/2020  No orders of the defined types were placed in this encounter.  Meds ordered this encounter  Medications  . buPROPion (WELLBUTRIN SR) 150 MG 12 hr tablet    Sig: Take 1 tablet (150 mg total) by mouth 2 (two) times daily. Take 150mg  daily first 3 days, then two times a day    Dispense:  63 tablet    Refill:  6  . Fluticasone-Umeclidin-Vilant (TRELEGY ELLIPTA) 200-62.5-25 MCG/INH AEPB    Sig: Inhale 1 puff into the lungs daily.    Dispense:  60 each    Refill:  6    Return in about 6 months (around 03/13/2020).  [place], MD [facility] 09/14/2019 10:45 AM  Office Number [phone]

## 2019-09-14 NOTE — Patient Instructions (Addendum)
Emphysema, mild bronchiectasis --INCREASE Trelegy 200-62.5-25 ONE puff ONCE days --CONTINUE Albuterol as needed for shortness of breath or wheezing  Tobacco abuse Patient is an active smoker. We discussed smoking cessation for 8 minutes. We discussed triggers and stressors and ways to deal with them. We discussed barriers to continued smoking and benefits of smoking cessation. Provided patient with information cessation techniques and interventions including [place] quitline. --START wellbutrin  Follow-up with me in 6 months   Smoking Tobacco Information, Adult Smoking tobacco can be harmful to your health. Tobacco contains a poisonous (toxic), colorless chemical called nicotine. Nicotine is addictive. It changes the brain and can make it hard to stop smoking. Tobacco also has other toxic chemicals that can hurt your body and raise your risk of many cancers. How can smoking tobacco affect me? Smoking tobacco puts you at risk for:  Cancer. Smoking is most commonly associated with lung cancer, but can also lead to cancer in other parts of the body.  Chronic obstructive pulmonary disease (COPD). This is a long-term lung condition that makes it hard to breathe. It also gets worse over time.  High blood pressure (hypertension), heart disease, stroke, or heart attack.  Lung infections, such as pneumonia.  Cataracts. This is when the lenses in the eyes become clouded.  Digestive problems. This may include peptic ulcers, heartburn, and gastroesophageal reflux disease (GERD).  Oral health problems, such as gum disease and tooth loss.  Loss of taste and smell. Smoking can affect your appearance by causing:  Wrinkles.  Yellow or stained teeth, fingers, and fingernails. Smoking tobacco can also affect your social life, because:  It may be challenging to find places to smoke when away from home. Many workplaces, [employer], hotels, and public places are tobacco-free.  Smoking is expensive.  This is due to the cost of tobacco and the long-term costs of treating health problems from smoking.  Secondhand smoke may affect those around you. Secondhand smoke can cause lung cancer, breathing problems, and heart disease. Children of smokers have a higher risk for: ? Sudden infant death syndrome (SIDS). ? Ear infections. ? Lung infections. If you currently smoke tobacco, quitting now can help you:  Lead a longer and healthier life.  Look, smell, breathe, and feel better over time.  Save money.  Protect others from the harms of secondhand smoke. What actions can I take to prevent health problems? Quit smoking   Do not start smoking. Quit if you already do.  Make a plan to quit smoking and commit to it. Look for programs to help you and ask your health care provider for recommendations and ideas.  Set a date and write down all the reasons you want to quit.  Let your friends and family know you are quitting so they can help and support you. Consider finding friends who also want to quit. It can be easier to quit with someone else, so that you can support each other.  Talk with your health care provider about using nicotine replacement medicines to help you quit, such as gum, lozenges, patches, sprays, or pills.  Do not replace cigarette smoking with electronic cigarettes, which are commonly called e-cigarettes. The safety of e-cigarettes is not known, and some may contain harmful chemicals.  If you try to quit but return to smoking, stay positive. It is common to slip up when you first quit, so take it one day at a time.  Be prepared for cravings. When you feel the urge to smoke, chew gum  or suck on hard candy. Lifestyle  Stay busy and take care of your body.  Drink enough fluid to keep your urine pale yellow.  Get plenty of exercise and eat a healthy diet. This can help prevent weight gain after quitting.  Monitor your eating habits. Quitting smoking can cause you to have  a larger appetite than when you smoke.  Find ways to relax. Go out with friends or family to a movie or a restaurant where people do not smoke.  Ask your health care provider about having regular tests (screenings) to check for cancer. This may include blood tests, imaging tests, and other tests.  Find ways to manage your stress, such as meditation, yoga, or exercise. Where to find support To get support to quit smoking, consider:  Asking your health care provider for more information and resources.  Taking classes to learn more about quitting smoking.  Looking for local organizations that offer resources about quitting smoking.  Joining a support group for people who want to quit smoking in your local community.  Calling the smokefree.gov counselor helpline: 1-800-Quit-Now 872-255-2740) Where to find more information You may find more information about quitting smoking from:  HelpGuide.org: www.helpguide.org  https://hall.com/: smokefree.gov  American Lung Association: www.lung.org Contact a health care provider if you:  Have problems breathing.  Notice that your lips, nose, or fingers turn blue.  Have chest pain.  Are coughing up blood.  Feel faint or you pass out.  Have other health changes that cause you to worry. Summary  Smoking tobacco can negatively affect your health, the health of those around you, your finances, and your social life.  Do not start smoking. Quit if you already do. If you need help quitting, ask your health care provider.  Think about joining a support group for people who want to quit smoking in your local community. There are many effective programs that will help you to quit this behavior. This information is not intended to replace advice given to you by your health care provider. Make sure you discuss any questions you have with your health care provider. Document Revised: 04/08/2019 Document Reviewed: 07/29/2016 Elsevier Patient  Education  2020 [employer].

## 2019-10-26 DIAGNOSIS — E7849 Other hyperlipidemia: Secondary | ICD-10-CM | POA: Diagnosis not present

## 2019-10-26 DIAGNOSIS — G894 Chronic pain syndrome: Secondary | ICD-10-CM | POA: Diagnosis not present

## 2019-10-26 DIAGNOSIS — I10 Essential (primary) hypertension: Secondary | ICD-10-CM | POA: Diagnosis not present

## 2019-11-08 ENCOUNTER — Other Ambulatory Visit (HOSPITAL_COMMUNITY): Payer: Self-pay | Admitting: Internal Medicine

## 2019-11-08 DIAGNOSIS — B370 Candidal stomatitis: Secondary | ICD-10-CM | POA: Diagnosis not present

## 2019-11-08 DIAGNOSIS — M1991 Primary osteoarthritis, unspecified site: Secondary | ICD-10-CM | POA: Diagnosis not present

## 2019-11-08 DIAGNOSIS — Z1231 Encounter for screening mammogram for malignant neoplasm of breast: Secondary | ICD-10-CM

## 2019-11-08 DIAGNOSIS — I700 Atherosclerosis of aorta: Secondary | ICD-10-CM | POA: Diagnosis not present

## 2019-11-08 DIAGNOSIS — G894 Chronic pain syndrome: Secondary | ICD-10-CM | POA: Diagnosis not present

## 2019-11-08 DIAGNOSIS — K140 Glossitis: Secondary | ICD-10-CM | POA: Diagnosis not present

## 2019-11-14 ENCOUNTER — Other Ambulatory Visit: Payer: Self-pay

## 2019-11-14 ENCOUNTER — Ambulatory Visit (HOSPITAL_COMMUNITY)
Admission: RE | Admit: 2019-11-14 | Discharge: 2019-11-14 | Disposition: A | Discharge status: Home / Self Care | Payer: Medicare Other | Source: Ambulatory Visit | Attending: Internal Medicine | Admitting: Internal Medicine

## 2019-11-14 DIAGNOSIS — Z1231 Encounter for screening mammogram for malignant neoplasm of breast: Secondary | ICD-10-CM | POA: Diagnosis not present

## 2019-11-25 DIAGNOSIS — E7849 Other hyperlipidemia: Secondary | ICD-10-CM | POA: Diagnosis not present

## 2019-11-25 DIAGNOSIS — G894 Chronic pain syndrome: Secondary | ICD-10-CM | POA: Diagnosis not present

## 2019-11-25 DIAGNOSIS — I10 Essential (primary) hypertension: Secondary | ICD-10-CM | POA: Diagnosis not present

## 2020-01-13 DIAGNOSIS — E7849 Other hyperlipidemia: Secondary | ICD-10-CM | POA: Diagnosis not present

## 2020-01-13 DIAGNOSIS — I10 Essential (primary) hypertension: Secondary | ICD-10-CM | POA: Diagnosis not present

## 2020-01-13 DIAGNOSIS — G894 Chronic pain syndrome: Secondary | ICD-10-CM | POA: Diagnosis not present

## 2020-01-25 DIAGNOSIS — I10 Essential (primary) hypertension: Secondary | ICD-10-CM | POA: Diagnosis not present

## 2020-01-25 DIAGNOSIS — Z720 Tobacco use: Secondary | ICD-10-CM | POA: Diagnosis not present

## 2020-01-25 DIAGNOSIS — J449 Chronic obstructive pulmonary disease, unspecified: Secondary | ICD-10-CM | POA: Diagnosis not present

## 2020-01-25 DIAGNOSIS — G894 Chronic pain syndrome: Secondary | ICD-10-CM | POA: Diagnosis not present

## 2020-02-24 DIAGNOSIS — I10 Essential (primary) hypertension: Secondary | ICD-10-CM | POA: Diagnosis not present

## 2020-02-24 DIAGNOSIS — G894 Chronic pain syndrome: Secondary | ICD-10-CM | POA: Diagnosis not present

## 2020-02-24 DIAGNOSIS — Z720 Tobacco use: Secondary | ICD-10-CM | POA: Diagnosis not present

## 2020-02-24 DIAGNOSIS — E7849 Other hyperlipidemia: Secondary | ICD-10-CM | POA: Diagnosis not present

## 2020-02-24 DIAGNOSIS — J449 Chronic obstructive pulmonary disease, unspecified: Secondary | ICD-10-CM | POA: Diagnosis not present

## 2020-03-27 DIAGNOSIS — E7849 Other hyperlipidemia: Secondary | ICD-10-CM | POA: Diagnosis not present

## 2020-03-27 DIAGNOSIS — I10 Essential (primary) hypertension: Secondary | ICD-10-CM | POA: Diagnosis not present

## 2020-03-27 DIAGNOSIS — Z720 Tobacco use: Secondary | ICD-10-CM | POA: Diagnosis not present

## 2020-03-27 DIAGNOSIS — J449 Chronic obstructive pulmonary disease, unspecified: Secondary | ICD-10-CM | POA: Diagnosis not present

## 2020-04-06 ENCOUNTER — Ambulatory Visit (HOSPITAL_COMMUNITY)
Admission: RE | Admit: 2020-04-06 | Discharge: 2020-04-06 | Disposition: A | Discharge status: Home / Self Care | Payer: Medicare Other | Source: Ambulatory Visit | Attending: Acute Care | Admitting: Acute Care

## 2020-04-06 ENCOUNTER — Other Ambulatory Visit: Payer: Self-pay

## 2020-04-06 DIAGNOSIS — F17210 Nicotine dependence, cigarettes, uncomplicated: Secondary | ICD-10-CM

## 2020-04-06 DIAGNOSIS — Z122 Encounter for screening for malignant neoplasm of respiratory organs: Secondary | ICD-10-CM

## 2020-04-06 DIAGNOSIS — Z87891 Personal history of nicotine dependence: Secondary | ICD-10-CM | POA: Diagnosis not present

## 2020-04-06 DIAGNOSIS — F17200 Nicotine dependence, unspecified, uncomplicated: Secondary | ICD-10-CM | POA: Diagnosis not present

## 2020-04-12 NOTE — Progress Notes (Signed)
Please call patient and let them  know their  low dose Ct was read as a Lung  RADS 3, nodules that are probably benign findings, short term follow up suggested: includes nodules with a low likelihood of becoming a clinically active cancer. Radiology recommends a 6 month repeat LDCT follow up. .Please let them  know we will order and schedule their  annual screening scan for 09/2020 Please let them  know there was notation of CAD on their  scan.  Please remind the patient  that this is a non-gated exam therefore degree or severity of disease  cannot be determined. Please have them  follow up with their PCP regarding potential risk factor modification, dietary therapy or pharmacologic therapy if clinically indicated. Pt.  is  currently on statin therapy. Please place order for annual  screening scan for  03/2021 and fax results to PCP. Thanks so much.  I did discuss with the patient the need to follow up with cards at least annually. She has a cardiologist, and is on statin medication. She has not seen cards for awhile.

## 2020-04-13 ENCOUNTER — Other Ambulatory Visit: Payer: Self-pay | Admitting: *Deleted

## 2020-04-13 DIAGNOSIS — F17210 Nicotine dependence, cigarettes, uncomplicated: Secondary | ICD-10-CM

## 2020-04-13 DIAGNOSIS — Z87891 Personal history of nicotine dependence: Secondary | ICD-10-CM

## 2020-04-23 ENCOUNTER — Encounter (HOSPITAL_COMMUNITY): Payer: Self-pay | Admitting: Emergency Medicine

## 2020-04-23 ENCOUNTER — Other Ambulatory Visit: Payer: Self-pay

## 2020-04-23 ENCOUNTER — Emergency Department (HOSPITAL_COMMUNITY)
Admission: EM | Admit: 2020-04-23 | Discharge: 2020-04-24 | Disposition: A | Discharge status: Home / Self Care | Payer: Medicare Other | Attending: Emergency Medicine | Admitting: Emergency Medicine

## 2020-04-23 DIAGNOSIS — F17210 Nicotine dependence, cigarettes, uncomplicated: Secondary | ICD-10-CM | POA: Diagnosis not present

## 2020-04-23 DIAGNOSIS — J189 Pneumonia, unspecified organism: Secondary | ICD-10-CM

## 2020-04-23 DIAGNOSIS — I10 Essential (primary) hypertension: Secondary | ICD-10-CM | POA: Insufficient documentation

## 2020-04-23 DIAGNOSIS — U071 COVID-19: Secondary | ICD-10-CM | POA: Diagnosis not present

## 2020-04-23 DIAGNOSIS — R05 Cough: Secondary | ICD-10-CM | POA: Diagnosis present

## 2020-04-23 DIAGNOSIS — Z91040 Latex allergy status: Secondary | ICD-10-CM | POA: Diagnosis not present

## 2020-04-23 DIAGNOSIS — J1282 Pneumonia due to coronavirus disease 2019: Secondary | ICD-10-CM | POA: Insufficient documentation

## 2020-04-23 DIAGNOSIS — Z23 Encounter for immunization: Secondary | ICD-10-CM | POA: Insufficient documentation

## 2020-04-23 DIAGNOSIS — Z79899 Other long term (current) drug therapy: Secondary | ICD-10-CM | POA: Insufficient documentation

## 2020-04-23 DIAGNOSIS — J449 Chronic obstructive pulmonary disease, unspecified: Secondary | ICD-10-CM | POA: Diagnosis not present

## 2020-04-23 NOTE — ED Triage Notes (Signed)
Pt here covid positive along with copd , pt has been vaccinated , MD sent pt over for possible infusion

## 2020-04-24 ENCOUNTER — Emergency Department (HOSPITAL_COMMUNITY): Payer: Medicare Other

## 2020-04-24 DIAGNOSIS — Z23 Encounter for immunization: Secondary | ICD-10-CM | POA: Diagnosis not present

## 2020-04-24 DIAGNOSIS — R05 Cough: Secondary | ICD-10-CM | POA: Diagnosis present

## 2020-04-24 DIAGNOSIS — U071 COVID-19: Secondary | ICD-10-CM | POA: Diagnosis not present

## 2020-04-24 MED ORDER — METHYLPREDNISOLONE SODIUM SUCC 125 MG IJ SOLR: 125.0000 mg | Freq: Once | INTRAMUSCULAR | Status: DC | PRN

## 2020-04-24 MED ORDER — SODIUM CHLORIDE 0.9 % IV SOLN: INTRAVENOUS | Status: DC | PRN

## 2020-04-24 MED ORDER — LEVOFLOXACIN 750 MG PO TABS: 750.0000 mg | ORAL_TABLET | Freq: Every day | ORAL | 0 refills | Status: DC

## 2020-04-24 MED ORDER — SODIUM CHLORIDE 0.9 % IV SOLN
Freq: Once | INTRAVENOUS | Status: AC
  Filled 2020-04-24: qty 20

## 2020-04-24 MED ORDER — ALBUTEROL SULFATE HFA 108 (90 BASE) MCG/ACT IN AERS: 2.0000 | INHALATION_SPRAY | Freq: Once | RESPIRATORY_TRACT | Status: DC | PRN

## 2020-04-24 MED ORDER — EPINEPHRINE 0.3 MG/0.3ML IJ SOAJ: 0.3000 mg | Freq: Once | INTRAMUSCULAR | Status: DC | PRN

## 2020-04-24 MED ORDER — LEVOFLOXACIN 750 MG PO TABS
750.0000 mg | ORAL_TABLET | Freq: Once | ORAL | Status: AC
  Administered 2020-04-24: 750 mg via ORAL
  Filled 2020-04-24: qty 1

## 2020-04-24 MED ORDER — DIPHENHYDRAMINE HCL 50 MG/ML IJ SOLN: 50.0000 mg | Freq: Once | INTRAMUSCULAR | Status: DC | PRN

## 2020-04-24 MED ORDER — SODIUM CHLORIDE 0.9 % IV SOLN: 1200.0000 mg | Freq: Once | INTRAVENOUS | Status: DC

## 2020-04-24 MED ORDER — FAMOTIDINE IN NACL 20-0.9 MG/50ML-% IV SOLN: 20.0000 mg | Freq: Once | INTRAVENOUS | Status: DC | PRN

## 2020-04-24 NOTE — ED Provider Notes (Signed)
[facility] EMERGENCY DEPARTMENT Provider Note   CSN: [id number] Arrival date & time: 04/23/20  1335     History No chief complaint on file.   Brandi Bean is a 69 y.o. female.  HPI 69 yo female s/p covid vaccine presents with covid symptoms after husband had covid.  Patient began having uri symptoms 4 days ago.  Positive test yesterday at [facility].  Breathing worse with dyspnea and cough productive of dark yellow sputum.  Denies fever, no chills, no nausea, vomiting.  States generally feels weak.     Past Medical History:  Diagnosis Date  . Anxiety   . Arthritis   . Back pain   . COPD (chronic obstructive pulmonary disease) ([place])   . Depression   . Dyspnea    W/ EXERTION+   WHEEZING  . Fibromyalgia   . Headache   . Hypertension   . PONV (postoperative nausea and vomiting)   . Sleep apnea    ?   MILD NO MACHINE ORDERED, TO BE RETESTED AT HOME AFTER SURGERY  . Stroke [facility])    MINI STROKE   15-20 YRS AGO  . UTI (urinary tract infection)     Patient Active Problem List   Diagnosis Date Noted  . Centrilobular emphysema ([place]) 04/25/2019  . Abnormal CT lung screening 04/25/2019  . Cervical stenosis of spinal canal 08/05/2016  . Smoking 02/19/2015  . Cancer screening 02/19/2015  . TOBACCO ABUSE 08/09/2007  . HYPERTENSION 06/25/2007    Past Surgical History:  Procedure Laterality Date  . ABDOMINAL HYSTERECTOMY     BLADDER TACK  . ANTERIOR CERVICAL DECOMP/DISCECTOMY FUSION N/A 08/05/2016   Procedure: ANTERIOR CERVICAL DECOMPRESSION/DISCECTOMY FUSION  - CERVICAL THREE-FOUR, CERRVICAL FOUR-FIVE, CERVICAL FIVE-SIX;  Surgeon: Earnie Larsson, MD;  Location: [facility];  Service: Neurosurgery;  Laterality: N/A;  . CATARACT EXTRACTION W/PHACO Right 02/24/2017   Procedure: CATARACT EXTRACTION PHACO AND INTRAOCULAR LENS PLACEMENT (IOC);  Surgeon: Tonny Branch, MD;  Location: [facility];  Service: Ophthalmology;  Laterality: Right;  CDE: 9.96  . CATARACT EXTRACTION W/PHACO  Left 03/09/2017   Procedure: CATARACT EXTRACTION PHACO AND INTRAOCULAR LENS PLACEMENT LEFT EYE;  Surgeon: Tonny Branch, MD;  Location: [facility];  Service: Ophthalmology;  Laterality: Left;  CDE: 7.10  . CHOLECYSTECTOMY    . COLONOSCOPY N/A 02/14/2014   Procedure: COLONOSCOPY;  Surgeon: Danie Binder, MD;  Location[facility];  Service: Endoscopy;  Laterality: N/A;  10:00 AM-moved to [place] to notify pt  . HAND SURGERY     RIGHT  . POLYPECTOMY  02/14/2014   Procedure: POLYPECTOMY;  Surgeon: Danie Binder, MD;  Location: [facility];  Service: Endoscopy;;  Sigmoid colon     OB History   No obstetric history on file.     No family history on file.  Social History   Tobacco Use  . Smoking status: Current Every Day Smoker    Packs/day: 1.00    Years: 28.00    Pack years: 28.00    Types: Cigarettes    Start date: 65  . Smokeless tobacco: Never Used  . Tobacco comment: 09/14/19- half pack a day  Vaping Use  . Vaping Use: Some days  Substance Use Topics  . Alcohol use: No    Alcohol/week: 0.0 standard drinks  . Drug use: No    Home Medications Prior to Admission medications   Medication Sig Start Date End Date Taking? Authorizing Provider  albuterol (PROVENTIL HFA;VENTOLIN HFA) 108 (90  BASE) MCG/ACT inhaler Inhale 2 puffs into the lungs every 6 (six) hours as needed for wheezing or shortness of breath.     [provider]  atorvastatin (LIPITOR) 20 MG tablet Take 20 mg by mouth daily at 6 PM.    [provider]  bisoprolol-hydrochlorothiazide (ZIAC) 10-6.25 MG per tablet Take 1 tablet by mouth every evening.     [provider]  buPROPion (WELLBUTRIN SR) 150 MG 12 hr tablet Take 1 tablet (150 mg total) by mouth 2 (two) times daily. Take 150mg  daily first 3 days, then two times a day 09/14/19   Margaretha Seeds, MD  Fluticasone-Umeclidin-Vilant (TRELEGY ELLIPTA) 100-62.5-25 MCG/INH AEPB Inhale 1 puff into the lungs daily. 08/19/19   Margaretha Seeds, MD  Fluticasone-Umeclidin-Vilant (TRELEGY ELLIPTA) 200-62.5-25 MCG/INH AEPB Inhale 1 puff into the lungs daily. 09/14/19   Margaretha Seeds, MD  guaifenesin (ROBITUSSIN) 100 MG/5ML syrup Take 200 mg by mouth at bedtime as needed for cough.    [provider]  ibuprofen (ADVIL,MOTRIN) 200 MG tablet Take 400 mg by mouth every 8 (eight) hours as needed for mild pain.    [provider]  lidocaine (LIDODERM) 5 % Place 1 patch onto the skin daily as needed (pain). Remove & Discard patch within 12 hours or as directed by MD    [provider]  oxyCODONE-acetaminophen (PERCOCET) 10-325 MG tablet Take 1-2 tablets by mouth every 4 (four) hours as needed for pain. Patient taking differently: Take 1 tablet by mouth 2 (two) times daily as needed for pain.  08/06/16   Earnie Larsson, MD    Allergies    Codeine sulfate, Hydrocodone, Latex, and Prednisone  Review of Systems   Review of Systems  All other systems reviewed and are negative.   Physical Exam Updated Vital Signs BP 136/86 (BP Location: Right Arm)   Pulse 93   Temp 98 F (36.7 C) (Oral)   Resp 18   SpO2 94%   Physical Exam Vitals and nursing note reviewed.  Constitutional:      Appearance: Normal appearance.  HENT:     Head: Normocephalic.     Right Ear: External ear normal.     Left Ear: External ear normal.     Mouth/Throat:     Mouth: Mucous membranes are moist.     Pharynx: Oropharynx is clear.  Eyes:     Pupils: Pupils are equal, round, and reactive to light.  Cardiovascular:     Rate and Rhythm: Normal rate and regular rhythm.     Pulses: Normal pulses.  Pulmonary:     Effort: Pulmonary effort is normal. No respiratory distress.     Breath sounds: No stridor. No wheezing, rhonchi or rales.  Chest:     Chest wall: No tenderness.  Abdominal:     General: Abdomen is flat.     Palpations: Abdomen is soft.  Musculoskeletal:        General: Normal range of motion.     Cervical back: Normal  range of motion.  Skin:    General: Skin is warm.     Capillary Refill: Capillary refill takes less than 2 seconds.  Neurological:     General: No focal deficit present.     Mental Status: She is alert.  Psychiatric:        Mood and Affect: Mood normal.     ED Results / Procedures / Treatments   Labs (all labs ordered are listed, but only abnormal results  are displayed) Labs Reviewed - No data to display  EKG None  Radiology DG Chest [facility] 1 View  Result Date: 04/24/2020 CLINICAL DATA:  COVID-19 positivity despite vaccination with increased cough and shortness of breath EXAM: PORTABLE CHEST 1 VIEW COMPARISON:  09/18/2016 FINDINGS: Cardiac shadow is within normal limits. The lungs are hyperinflated consistent with COPD. Patchy density is noted in the right lung base consistent with an acute infiltrate. Old rib fractures are noted on the right. IMPRESSION: Right basilar infiltrate. Electronically Signed   By: Inez Catalina M.D.   On: 04/24/2020 09:05    Procedures Procedures (including critical care time)  Medications Ordered in ED Medications - No data to display  ED Course  I have reviewed the triage vital signs and the nursing notes.  Pertinent labs & imaging results that were available during my care of the patient were reviewed by me and considered in my medical decision making (see chart for details).    MDM Rules/Calculators/A&P                          69 year old female history of COPD, status post immunization for Covid several months ago presents after having a positive Covid test yesterday with 4 days of symptoms. She has some cough and dyspnea. Here her oxygen saturations are 94%. She also has an infiltrate seen on her chest x-Wynonna Fitzhenry. Her lungs are clear and there is no wheezing. She is receiving clinical antibody infusion here in the ED. She will also be started on Levaquin for possible secondary infection. Patient received monoclonal antibody infusion here in  ED. Patient is using her inhalers at home. We have discussed monitoring at home, close follow-up with her primary care doctor, and return precautions especially increased dyspnea and lower sats. Patient voices understanding of plan. KAYCE CHISMAR was evaluated in Emergency Department on 04/24/2020 for the symptoms described in the history of present illness. She was evaluated in the context of the global COVID-19 pandemic, which necessitated consideration that the patient might be at risk for infection with the SARS-CoV-2 virus that causes COVID-19. Institutional protocols and algorithms that pertain to the evaluation of patients at risk for COVID-19 are in a state of rapid change based on information released by regulatory bodies including the CDC and federal and state organizations. These policies and algorithms were followed during the patient's care in the ED.  Final Clinical Impression(s) / ED Diagnoses Final diagnoses:  COVID-19  Community acquired pneumonia of right lung, unspecified part of lung    Rx / DC Orders ED Discharge Orders    None       Pattricia Boss, MD 04/24/20 1134

## 2020-04-24 NOTE — Discharge Instructions (Addendum)
You received monoclonal antibody therapy in the emergency department. Please take antibiotics as prescribed Please drink plenty of fluids. If you are feeling short of breath and your oxygen saturations are falling below 92%, return for reevaluation or call your doctor for reevaluation. Continue your usual COPD medications and inhalers. Return if you are worsening especially inability to tolerate fluids, high fever, or worsening shortness of breath.

## 2020-04-26 DIAGNOSIS — I10 Essential (primary) hypertension: Secondary | ICD-10-CM | POA: Diagnosis not present

## 2020-04-26 DIAGNOSIS — E7849 Other hyperlipidemia: Secondary | ICD-10-CM | POA: Diagnosis not present

## 2020-04-26 DIAGNOSIS — G894 Chronic pain syndrome: Secondary | ICD-10-CM | POA: Diagnosis not present

## 2020-05-18 DIAGNOSIS — Z681 Body mass index (BMI) 19 or less, adult: Secondary | ICD-10-CM | POA: Diagnosis not present

## 2020-05-18 DIAGNOSIS — J449 Chronic obstructive pulmonary disease, unspecified: Secondary | ICD-10-CM | POA: Diagnosis not present

## 2020-05-18 DIAGNOSIS — M1991 Primary osteoarthritis, unspecified site: Secondary | ICD-10-CM | POA: Diagnosis not present

## 2020-05-18 DIAGNOSIS — G894 Chronic pain syndrome: Secondary | ICD-10-CM | POA: Diagnosis not present

## 2020-05-18 DIAGNOSIS — R5383 Other fatigue: Secondary | ICD-10-CM | POA: Diagnosis not present

## 2020-05-18 DIAGNOSIS — I10 Essential (primary) hypertension: Secondary | ICD-10-CM | POA: Diagnosis not present

## 2020-05-26 DIAGNOSIS — I10 Essential (primary) hypertension: Secondary | ICD-10-CM | POA: Diagnosis not present

## 2020-05-26 DIAGNOSIS — G894 Chronic pain syndrome: Secondary | ICD-10-CM | POA: Diagnosis not present

## 2020-05-26 DIAGNOSIS — E7849 Other hyperlipidemia: Secondary | ICD-10-CM | POA: Diagnosis not present

## 2020-06-19 ENCOUNTER — Encounter: Payer: Self-pay | Admitting: Pulmonary Disease

## 2020-06-19 ENCOUNTER — Telehealth: Payer: Self-pay | Admitting: Pulmonary Disease

## 2020-06-19 ENCOUNTER — Ambulatory Visit: Payer: Medicare Other | Admitting: Pulmonary Disease

## 2020-06-19 ENCOUNTER — Other Ambulatory Visit: Payer: Self-pay

## 2020-06-19 VITALS — BP 120/60 | HR 84 | Temp 98.0°F | Wt 99.5 lb

## 2020-06-19 DIAGNOSIS — F17210 Nicotine dependence, cigarettes, uncomplicated: Secondary | ICD-10-CM

## 2020-06-19 DIAGNOSIS — J432 Centrilobular emphysema: Secondary | ICD-10-CM | POA: Diagnosis not present

## 2020-06-19 DIAGNOSIS — J441 Chronic obstructive pulmonary disease with (acute) exacerbation: Secondary | ICD-10-CM | POA: Diagnosis not present

## 2020-06-19 DIAGNOSIS — Z720 Tobacco use: Secondary | ICD-10-CM

## 2020-06-19 MED ORDER — PREDNISONE 20 MG PO TABS: 20.0000 mg | ORAL_TABLET | Freq: Every day | ORAL | 0 refills | Status: AC

## 2020-06-19 MED ORDER — PREDNISONE 20 MG PO TABS: 20.0000 mg | ORAL_TABLET | Freq: Every day | ORAL | 0 refills | Status: DC

## 2020-06-19 MED ORDER — SPIRIVA RESPIMAT 2.5 MCG/ACT IN AERS: 2.0000 | INHALATION_SPRAY | Freq: Every day | RESPIRATORY_TRACT | 0 refills | Status: DC

## 2020-06-19 NOTE — Progress Notes (Signed)
Subjective:   PATIENT ID: Brandi Bean GENDER: female DOB: 06-Oct-1950, MRN: [id number]   HPI  Chief Complaint  Patient presents with  . Follow-up    Emphysema.  breathing is better after having covid   Reason for Visit: Follow-up  [name] is a 69 year old female active smoker with emphysema who presents for follow-up.  She recently presented to ED for covid symptoms, testing positive on 04/23/20. At the time she had shortness of breath and general weakness. Since then her breathing has returned to baseline. She has exertional dyspnea, productive cough with yellow sputum and occasional wheezing. Albuterol helps with her symptoms. She was previously on Trelegy however developed redness and pain in her mouth and tongue. She stopped and was restarted back on Spiriva. She is still smoking 1/2 ppd. She is working to cut down since her husband has quit.  Social History: Currently smokes 1/2 pack per day.  I have personally reviewed patient's past medical/family/social history/allergies/current medications.  Past Medical History:  Diagnosis Date  . Anxiety   . Arthritis   . Back pain   . COPD (chronic obstructive pulmonary disease) ([place])   . Depression   . Dyspnea    W/ EXERTION+   WHEEZING  . Fibromyalgia   . Headache   . Hypertension   . PONV (postoperative nausea and vomiting)   . Sleep apnea    ?   MILD NO MACHINE ORDERED, TO BE RETESTED AT HOME AFTER SURGERY  . Stroke [facility])    MINI STROKE   15-20 YRS AGO  . UTI (urinary tract infection)     Outpatient Medications Prior to Visit  Medication Sig Dispense Refill  . albuterol (PROVENTIL HFA;VENTOLIN HFA) 108 (90 BASE) MCG/ACT inhaler Inhale 2 puffs into the lungs every 6 (six) hours as needed for wheezing or shortness of breath.     Marland Kitchen atorvastatin (LIPITOR) 20 MG tablet Take 20 mg by mouth daily at 6 PM.    . bisoprolol-hydrochlorothiazide (ZIAC) 10-6.25 MG per tablet Take 1 tablet by mouth every evening.     Marland Kitchen  guaifenesin (ROBITUSSIN) 100 MG/5ML syrup Take 200 mg by mouth at bedtime as needed for cough.    Marland Kitchen ibuprofen (ADVIL,MOTRIN) 200 MG tablet Take 400 mg by mouth every 8 (eight) hours as needed for mild pain.    Marland Kitchen lidocaine (LIDODERM) 5 % Place 1 patch onto the skin daily as needed (pain). Remove & Discard patch within 12 hours or as directed by MD    . oxyCODONE-acetaminophen (PERCOCET) 10-325 MG tablet Take 1-2 tablets by mouth every 4 (four) hours as needed for pain. (Patient taking differently: Take 1 tablet by mouth 2 (two) times daily as needed for pain. ) 60 tablet 0  . SPIRIVA HANDIHALER 18 MCG inhalation capsule Place into inhaler and inhale daily.    Marland Kitchen buPROPion (WELLBUTRIN SR) 150 MG 12 hr tablet Take 1 tablet (150 mg total) by mouth 2 (two) times daily. Take 150mg  daily first 3 days, then two times a day (Patient not taking: Reported on 06/19/2020) 63 tablet 6  . Fluticasone-Umeclidin-Vilant (TRELEGY ELLIPTA) 100-62.5-25 MCG/INH AEPB Inhale 1 puff into the lungs daily. (Patient not taking: Reported on 06/19/2020) 1 each 3  . Fluticasone-Umeclidin-Vilant (TRELEGY ELLIPTA) 200-62.5-25 MCG/INH AEPB Inhale 1 puff into the lungs daily. (Patient not taking: Reported on 06/19/2020) 60 each 6  . levofloxacin (LEVAQUIN) 750 MG tablet Take 1 tablet (750 mg total) by mouth daily. (Patient not taking: Reported on 06/19/2020)  6 tablet 0   No facility-administered medications prior to visit.    Review of Systems  Constitutional: Negative for chills, diaphoresis, fever, malaise/fatigue and weight loss.  HENT: Negative for congestion.   Respiratory: Positive for cough, shortness of breath and wheezing. Negative for hemoptysis and sputum production.   Cardiovascular: Negative for chest pain, palpitations and leg swelling.   Objective:   Vitals:   06/19/20 1127  BP: 120/60  Pulse: 84  Temp: 98 F (36.7 C)  TempSrc: Tympanic  SpO2: 94%  Weight: 99 lb 8 oz (45.1 kg)   SpO2: 94 %  Physical  Exam: General: Thin, chronically ill-appearing, no acute distress HENT: [place], AT, OP clear, MMM Eyes: EOMI, no scleral icterus Respiratory: Coarse breath sounds with expiratory wheeze Cardiovascular: RRR, -M/R/G, no JVD Extremities:-Edema,-tenderness Neuro: AAO x4, CNII-XII grossly intact Skin: Intact, no rashes or bruising Psych: Normal mood, normal affect  Data Reviewed:  Imaging: CT 03/23/19 - Mild bronchiectasis with interval development of tree-in-bud opacities in the mid-lower left lung. Mild emphysema. Stable sub-centimeter lung nodules including in RUL  CT Chest 06/20/19 - Slightly improved tree-in-bud opacities in LLL. Some mucous impaction in LLL. Findings may suggestive of aspiration. Mild centrilobular emphysema  CT Lung screen 04/06/20 - Multiple small pulmonary nodules with largest measured 5.72mm. Emphysema.  PFT: 02/07/14 FVC 1.9 (61%) FEV1 1.4 (58%) Ratio 74  Interpretation: No evidence of obstruction. Reduced FEV1 and FVC may suggest poor effort or restrictive defect  Micro: Sputum 04/27/19 - Pseudomonas aeruginosa, streptococcus Fungal 04/27/19- Aspergillus [employer] 04/27/19 - Neg  Assessment & Plan:   Discussion: 69 year old female active smoker with emphysema who presents for follow-up. In active exacerbation. Cannot tolerate Trelegy due to thrush. Currently on Spiriva.  COPD Exacerbation Emphysema Mild bronchiectasis --Prednisone 20 mg x 5d. Unknown allergy. After discussion, patient willing to try low dose for relief of respiratory symptoms. --REFILL Spiriva 18 mcg ONE inhalation daily. Will provide samples of Spiriva Respimat to try --CONTINUE Albuterol as needed for shortness of breath or wheezing  Tobacco abuse Patient is an active smoker. She did not try to take her wellbutrin and self-discontinued for unclear resons. She is still interested in quitting. We discussed smoking cessation for 5 minutes. We discussed triggers and stressors and ways to deal  with them. We discussed barriers to continued smoking and benefits of smoking cessation. Provided patient with information cessation techniques and interventions including [place] quitline.   Health Maintenance Immunization History  Administered Date(s) Administered  . Fluad Quad(high Dose 65+) 04/25/2019  . Influenza Split 05/28/2012, 08/28/2014  . Influenza Whole 05/14/2009, 04/07/2011  . Moderna SARS-COVID-2 Vaccination 09/02/2019  . Pneumococcal Conjugate-13 02/19/2015  . Pneumococcal Polysaccharide-23 05/28/2012   CT Lung Screen - Due 03/2021  No orders of the defined types were placed in this encounter.  Meds ordered this encounter  Medications  . predniSONE (DELTASONE) 20 MG tablet    Sig: Take 1 tablet (20 mg total) by mouth daily with breakfast for 5 days.    Dispense:  10 tablet    Refill:  0   Return in about 1 month (around 07/19/2020).  Alahna Dunne Rodman Pickle, MD [facility] 06/19/2020 12:04 PM  Office Number [phone]

## 2020-06-19 NOTE — Telephone Encounter (Signed)
Rx for prednisone 20mg  x5 days #10 was sent to the pharmacy. I have fixed the prescription to the reflect the correct #. Nothing further was needed.

## 2020-06-19 NOTE — Patient Instructions (Addendum)
Emphysema, mild bronchiectasis - uncontrolled symptoms --REFILL Spiriva 18 mcg ONE inhalation daily. Will provide samples of Spiriva Respimat to try --CONTINUE Albuterol as needed for shortness of breath or wheezing  Tobacco abuse QUIT smoking!  Follow up with me in 1 months   Coping with Quitting Smoking  Quitting smoking is a physical and mental challenge. You will face cravings, withdrawal symptoms, and temptation. Before quitting, work with your health care provider to make a plan that can help you cope. Preparation can help you quit and keep you from giving in. How can I cope with cravings? Cravings usually last for 5-10 minutes. If you get through it, the craving will pass. Consider taking the following actions to help you cope with cravings:  Keep your mouth busy: ? Chew sugar-free gum. ? Suck on hard candies or a straw. ? Brush your teeth.  Keep your hands and body busy: ? Immediately change to a different activity when you feel a craving. ? Squeeze or play with a ball. ? Do an activity or a hobby, like making bead jewelry, practicing needlepoint, or working with wood. ? Mix up your normal routine. ? Take a short exercise break. Go for a quick walk or run up and down stairs. ? Spend time in public places where smoking is not allowed.  Focus on doing something kind or helpful for someone else.  Call a friend or family member to talk during a craving.  Join a support group.  Call a quit line, such as 1-800-QUIT-NOW.  Talk with your health care provider about medicines that might help you cope with cravings and make quitting easier for you. How can I deal with withdrawal symptoms? Your body may experience negative effects as it tries to get used to not having nicotine in the system. These effects are called withdrawal symptoms. They may include:  Feeling hungrier than normal.  Trouble concentrating.  Irritability.  Trouble sleeping.  Feeling  depressed.  Restlessness and agitation.  Craving a cigarette. To manage withdrawal symptoms:  Avoid places, people, and activities that trigger your cravings.  Remember why you want to quit.  Get plenty of sleep.  Avoid coffee and other caffeinated drinks. These may worsen some of your symptoms. How can I handle social situations? Social situations can be difficult when you are quitting smoking, especially in the first few weeks. To manage this, you can:  Avoid parties, bars, and other social situations where people might be smoking.  Avoid alcohol.  Leave right away if you have the urge to smoke.  Explain to your family and friends that you are quitting smoking. Ask for understanding and support.  Plan activities with friends or family where smoking is not an option. What are some ways I can cope with stress? Wanting to smoke may cause stress, and stress can make you want to smoke. Find ways to manage your stress. Relaxation techniques can help. For example:  Breathe slowly and deeply, in through your nose and out through your mouth.  Listen to soothing, relaxing music.  Talk with a family member or friend about your stress.  Light a candle.  Soak in a bath or take a shower.  Think about a peaceful place. What are some ways I can prevent weight gain? Be aware that many people gain weight after they quit smoking. However, not everyone does. To keep from gaining weight, have a plan in place before you quit and stick to the plan after you quit. Your plan should  include:  Having healthy snacks. When you have a craving, it may help to: ? Eat plain popcorn, crunchy carrots, celery, or other cut vegetables. ? Chew sugar-free gum.  Changing how you eat: ? Eat small portion sizes at meals. ? Eat 4-6 small meals throughout the day instead of 1-2 large meals a day. ? Be mindful when you eat. Do not watch television or do other things that might distract you as you  eat.  Exercising regularly: ? Make time to exercise each day. If you do not have time for a long workout, do short bouts of exercise for 5-10 minutes several times a day. ? Do some form of strengthening exercise, like weight lifting, and some form of aerobic exercise, like running or swimming.  Drinking plenty of water or other low-calorie or no-calorie drinks. Drink 6-8 glasses of water daily, or as much as instructed by your health care provider. Summary  Quitting smoking is a physical and mental challenge. You will face cravings, withdrawal symptoms, and temptation to smoke again. Preparation can help you as you go through these challenges.  You can cope with cravings by keeping your mouth busy (such as by chewing gum), keeping your body and hands busy, and making calls to family, friends, or a helpline for people who want to quit smoking.  You can cope with withdrawal symptoms by avoiding places where people smoke, avoiding drinks with caffeine, and getting plenty of rest.  Ask your health care provider about the different ways to prevent weight gain, avoid stress, and handle social situations. This information is not intended to replace advice given to you by your health care provider. Make sure you discuss any questions you have with your health care provider. Document Revised: 06/26/2017 Document Reviewed: 07/11/2016 Elsevier Patient Education  2020 [employer].

## 2020-06-26 DIAGNOSIS — I10 Essential (primary) hypertension: Secondary | ICD-10-CM | POA: Diagnosis not present

## 2020-06-26 DIAGNOSIS — G894 Chronic pain syndrome: Secondary | ICD-10-CM | POA: Diagnosis not present

## 2020-06-26 DIAGNOSIS — E7849 Other hyperlipidemia: Secondary | ICD-10-CM | POA: Diagnosis not present

## 2020-07-17 ENCOUNTER — Ambulatory Visit: Payer: Medicare Other | Admitting: Pulmonary Disease

## 2020-07-17 ENCOUNTER — Encounter: Payer: Self-pay | Admitting: Pulmonary Disease

## 2020-07-17 ENCOUNTER — Other Ambulatory Visit: Payer: Self-pay

## 2020-07-17 VITALS — BP 124/80 | HR 75 | Temp 97.5°F | Ht 65.0 in | Wt 100.4 lb

## 2020-07-17 DIAGNOSIS — F17210 Nicotine dependence, cigarettes, uncomplicated: Secondary | ICD-10-CM

## 2020-07-17 DIAGNOSIS — J432 Centrilobular emphysema: Secondary | ICD-10-CM

## 2020-07-17 MED ORDER — SPIRIVA RESPIMAT 1.25 MCG/ACT IN AERS: 2.0000 | INHALATION_SPRAY | Freq: Every day | RESPIRATORY_TRACT | 0 refills | Status: DC

## 2020-07-17 NOTE — Addendum Note (Signed)
Addended by: Elie Confer on: 07/17/2020 10:36 AM   Modules accepted: Orders

## 2020-07-17 NOTE — Patient Instructions (Signed)
Tobacco Use Disorder Tobacco use disorder (TUD) occurs when a person craves, seeks, and uses tobacco, regardless of the consequences. This disorder can cause problems with mental and physical health. It can affect your ability to have healthy relationships, and it can keep you from meeting your responsibilities at work, home, or school. Tobacco may be:  Smoked as a cigarette or cigar.  Inhaled using e-cigarettes.  Smoked in a pipe or hookah.  Chewed as smokeless tobacco.  Inhaled into the nostrils as snuff. Tobacco products contain a dangerous chemical called nicotine, which is very addictive. Nicotine triggers hormones that make the body feel stimulated and works on areas of the brain that make you feel good. These effects can make it hard for people to quit nicotine. Tobacco contains many other unsafe chemicals that can damage almost every organ in the body. Smoking tobacco also puts others in danger due to fire risk and possible health problems caused by breathing in secondhand smoke. What are the signs or symptoms? Symptoms of TUD may include:  Being unable to slow down or stop your tobacco use.  Spending an abnormal amount of time getting or using tobacco.  Craving tobacco. Cravings may last for up to 6 months after quitting.  Tobacco use that: ? Interferes with your work, school, or home life. ? Interferes with your personal and social relationships. ? Makes you give up activities that you once enjoyed or found important.  Using tobacco even though you know that it is: ? Dangerous or bad for your health or someone else's health. ? Causing problems in your life.  Needing more and more of the substance to get the same effect (developing tolerance).  Experiencing unpleasant symptoms if you do not use the substance (withdrawal). Withdrawal symptoms may include: ? Depressed, anxious, or irritable mood. ? Difficulty concentrating. ? Increased appetite. ? Restlessness or trouble  sleeping.  Using the substance to avoid withdrawal. How is this diagnosed? This condition may be diagnosed based on:  Your current and past tobacco use. Your health care provider may ask questions about how your tobacco use affects your life.  A physical exam. You may be diagnosed with TUD if you have at least two symptoms within a 12-month period. How is this treated? This condition is treated by stopping tobacco use. Many people are unable to quit on their own and need help. Treatment may include:  Nicotine replacement therapy (NRT). NRT provides nicotine without the other harmful chemicals in tobacco. NRT gradually lowers the dosage of nicotine in the body and reduces withdrawal symptoms. NRT is available as: ? Over-the-counter gums, lozenges, and skin patches. ? Prescription mouth inhalers and nasal sprays.  Medicine that acts on the brain to reduce cravings and withdrawal symptoms.  A type of talk therapy that examines your triggers for tobacco use, how to avoid them, and how to cope with cravings (behavioral therapy).  Hypnosis. This may help with withdrawal symptoms.  Joining a support group for others coping with TUD. The best treatment for TUD is usually a combination of medicine, talk therapy, and support groups. Recovery can be a long process. Many people start using tobacco again after stopping (relapse). If you relapse, it does not mean that treatment will not work. Follow these instructions at home:  Lifestyle  Do not use any products that contain nicotine or tobacco, such as cigarettes and e-cigarettes.  Avoid things that trigger tobacco use as much as you can. Triggers include people and situations that usually cause you   to use tobacco.  Avoid drinks that contain caffeine, including coffee. These may worsen some withdrawal symptoms.  Find ways to manage stress. Wanting to smoke may cause stress, and stress can make you want to smoke. Relaxation techniques such as  deep breathing, meditation, and yoga may help.  Attend support groups as needed. These groups are an important part of long-term recovery for many people. General instructions  Take over-the-counter and prescription medicines only as told by your health care provider.  Check with your health care provider before taking any new prescription or over-the-counter medicines.  Decide on a friend, family member, or smoking quit-line (such as 1-800-QUIT-NOW in the [place] that you can call or text when you feel the urge to smoke or when you need help coping with cravings.  Keep all follow-up visits as told by your health care provider and therapist. This is important. Contact a health care provider if:  You are not able to take your medicines as prescribed.  Your symptoms get worse, even with treatment. Summary  Tobacco use disorder (TUD) occurs when a person craves, seeks, and uses tobacco regardless of the consequences.  This condition may be diagnosed based on your current and past tobacco use and a physical exam.  Many people are unable to quit on their own and need help. Recovery can be a long process.  The most effective treatment for TUD is usually a combination of medicine, talk therapy, and support groups. This information is not intended to replace advice given to you by your health care provider. Make sure you discuss any questions you have with your health care provider. Document Revised: 07/01/2017 Document Reviewed: 07/01/2017 Elsevier Patient Education  2020 Elsevier Inc[employer]  

## 2020-07-17 NOTE — Progress Notes (Signed)
Subjective:   PATIENT ID: Brandi Bean GENDER: female DOB: 04/17/1951, MRN: [id number]   HPI  No chief complaint on file.  Reason for Visit: Follow-up  [name] is a 69 year old female active smoker with emphysema who presents for follow-up.  Since our last visit, she has been compliant with her Spiriva. No ED/urgent care visits. She continues to be an active smoker up to 1 pack per day. Has felt stressed lately. Occasional congested cough with minimal yellow sputum. Denies wheezing or shortness of breath. Used her albuterol 1-2 times a month.   Social History: Active smoker  I have personally reviewed patient's past medical/family/social history/allergies/current medications. Past Medical History:  Diagnosis Date  . Anxiety   . Arthritis   . Back pain   . COPD (chronic obstructive pulmonary disease) ([facility])   . Depression   . Dyspnea    W/ EXERTION+   WHEEZING  . Fibromyalgia   . Headache   . Hypertension   . PONV (postoperative nausea and vomiting)   . Sleep apnea    ?   MILD NO MACHINE ORDERED, TO BE RETESTED AT HOME AFTER SURGERY  . Stroke [facility])    MINI STROKE   15-20 YRS AGO  . UTI (urinary tract infection)     Outpatient Medications Prior to Visit  Medication Sig Dispense Refill  . albuterol (PROVENTIL HFA;VENTOLIN HFA) 108 (90 BASE) MCG/ACT inhaler Inhale 2 puffs into the lungs every 6 (six) hours as needed for wheezing or shortness of breath.    Marland Kitchen atorvastatin (LIPITOR) 20 MG tablet Take 20 mg by mouth daily at 6 PM.    . bisoprolol-hydrochlorothiazide (ZIAC) 10-6.25 MG per tablet Take 1 tablet by mouth every evening.    Marland Kitchen guaifenesin (ROBITUSSIN) 100 MG/5ML syrup Take 200 mg by mouth at bedtime as needed for cough.    Marland Kitchen ibuprofen (ADVIL,MOTRIN) 200 MG tablet Take 400 mg by mouth every 8 (eight) hours as needed for mild pain.    Marland Kitchen lidocaine (LIDODERM) 5 % Place 1 patch onto the skin daily as needed (pain). Remove & Discard patch within 12 hours or as  directed by MD    . oxyCODONE-acetaminophen (PERCOCET) 10-325 MG tablet Take 1-2 tablets by mouth every 4 (four) hours as needed for pain. (Patient taking differently: Take 1 tablet by mouth 2 (two) times daily as needed for pain.) 60 tablet 0  . SPIRIVA HANDIHALER 18 MCG inhalation capsule Place into inhaler and inhale daily.    . Tiotropium Bromide Monohydrate (SPIRIVA RESPIMAT) 2.5 MCG/ACT AERS Inhale 2 puffs into the lungs daily. 2 each 0   No facility-administered medications prior to visit.    Review of Systems  Constitutional: Negative for chills, diaphoresis, fever, malaise/fatigue and weight loss.  HENT: Negative for congestion.   Respiratory: Positive for cough and sputum production. Negative for hemoptysis, shortness of breath and wheezing.   Cardiovascular: Negative for chest pain, palpitations and leg swelling.   Objective:   Vitals:   07/17/20 1005  BP: 124/80  Pulse: 75  Temp: (!) 97.5 F (36.4 C)  TempSrc: Tympanic  SpO2: 97%  Weight: 100 lb 6 oz (45.5 kg)  Height: 5\' 5"  (1.651 m)   SpO2: 97 %  Physical Exam: General:Thin, well-appearing, no acute distress HENT: , AT Eyes: EOMI, no scleral icterus Respiratory: Diminished breath sounds bilaterally.  No crackles, wheezing or rales Cardiovascular: RRR, -M/R/G, no JVD Extremities:-Edema,-tenderness Neuro: AAO x4, CNII-XII grossly intact Skin: Intact, no rashes or bruising  Psych: Normal mood, normal affect   Data Reviewed:  Imaging: CT 03/23/19 - Mild bronchiectasis with interval development of tree-in-bud opacities in the mid-lower left lung. Mild emphysema. Stable sub-centimeter lung nodules including in RUL  CT Chest 06/20/19 - Slightly improved tree-in-bud opacities in LLL. Some mucous impaction in LLL. Findings may suggestive of aspiration. Mild centrilobular emphysema  CT Lung screen 04/06/20 - Multiple small pulmonary nodules with largest measured 5.63mm. Emphysema.  PFT: 02/07/14 FVC 1.9 (61%) FEV1  1.4 (58%) Ratio 74  Interpretation: No evidence of obstruction. Reduced FEV1 and FVC may suggest poor effort or restrictive defect  Micro: Sputum 04/27/19 - Pseudomonas aeruginosa, streptococcus Fungal 04/27/19- Aspergillus [employer] 04/27/19 - Neg  Assessment & Plan:   Discussion: 69 year old female active smoker with emphysema who presents for follow-up. Unable to tolerate Trelegy due to thrush. Well-controlled on LAMA.  Emphysema Mild bronchiectasis --REFILL Spiriva 18 mcg ONE inhalation daily --CONTINUE Albuterol as needed for shortness of breath or wheezing  Tobacco abuse Patient is an active smoker. We discussed smoking cessation for 5 minutes. We discussed triggers and stressors and ways to deal with them. We discussed barriers to continued smoking and benefits of smoking cessation. Provided patient with information cessation techniques and interventions including [place] quitline.   Health Maintenance Immunization History  Administered Date(s) Administered  . Fluad Quad(high Dose 65+) 04/25/2019  . Influenza Split 05/28/2012, 08/28/2014  . Influenza Whole 05/14/2009, 04/07/2011  . Moderna Sars-Covid-2 Vaccination 09/02/2019, 09/30/2019  . Pneumococcal Conjugate-13 02/19/2015  . Pneumococcal Polysaccharide-23 05/28/2012   CT Lung Screen - Due 03/2021  No orders of the defined types were placed in this encounter.  No orders of the defined types were placed in this encounter.  Return in about 3 months (around 10/15/2020).  [place], MD [facility] 07/17/2020 10:09 AM  Office Number [phone]

## 2020-07-27 DIAGNOSIS — E7849 Other hyperlipidemia: Secondary | ICD-10-CM | POA: Diagnosis not present

## 2020-07-27 DIAGNOSIS — G894 Chronic pain syndrome: Secondary | ICD-10-CM | POA: Diagnosis not present

## 2020-07-27 DIAGNOSIS — I10 Essential (primary) hypertension: Secondary | ICD-10-CM | POA: Diagnosis not present

## 2020-08-25 DIAGNOSIS — I10 Essential (primary) hypertension: Secondary | ICD-10-CM | POA: Diagnosis not present

## 2020-08-25 DIAGNOSIS — E7849 Other hyperlipidemia: Secondary | ICD-10-CM | POA: Diagnosis not present

## 2020-08-25 DIAGNOSIS — G894 Chronic pain syndrome: Secondary | ICD-10-CM | POA: Diagnosis not present

## 2020-08-30 DIAGNOSIS — E782 Mixed hyperlipidemia: Secondary | ICD-10-CM | POA: Diagnosis not present

## 2020-08-30 DIAGNOSIS — M1991 Primary osteoarthritis, unspecified site: Secondary | ICD-10-CM | POA: Diagnosis not present

## 2020-08-30 DIAGNOSIS — I10 Essential (primary) hypertension: Secondary | ICD-10-CM | POA: Diagnosis not present

## 2020-08-30 DIAGNOSIS — Z1389 Encounter for screening for other disorder: Secondary | ICD-10-CM | POA: Diagnosis not present

## 2020-08-30 DIAGNOSIS — E538 Deficiency of other specified B group vitamins: Secondary | ICD-10-CM | POA: Diagnosis not present

## 2020-08-30 DIAGNOSIS — J449 Chronic obstructive pulmonary disease, unspecified: Secondary | ICD-10-CM | POA: Diagnosis not present

## 2020-08-30 DIAGNOSIS — E039 Hypothyroidism, unspecified: Secondary | ICD-10-CM | POA: Diagnosis not present

## 2020-08-30 DIAGNOSIS — Z0001 Encounter for general adult medical examination with abnormal findings: Secondary | ICD-10-CM | POA: Diagnosis not present

## 2020-08-30 DIAGNOSIS — Z681 Body mass index (BMI) 19 or less, adult: Secondary | ICD-10-CM | POA: Diagnosis not present

## 2020-08-30 DIAGNOSIS — E559 Vitamin D deficiency, unspecified: Secondary | ICD-10-CM | POA: Diagnosis not present

## 2020-09-24 DIAGNOSIS — G894 Chronic pain syndrome: Secondary | ICD-10-CM | POA: Diagnosis not present

## 2020-09-24 DIAGNOSIS — E7849 Other hyperlipidemia: Secondary | ICD-10-CM | POA: Diagnosis not present

## 2020-09-24 DIAGNOSIS — I10 Essential (primary) hypertension: Secondary | ICD-10-CM | POA: Diagnosis not present

## 2020-10-01 ENCOUNTER — Ambulatory Visit: Payer: Medicare Other | Admitting: Pulmonary Disease

## 2020-10-01 ENCOUNTER — Encounter: Payer: Self-pay | Admitting: Pulmonary Disease

## 2020-10-01 ENCOUNTER — Other Ambulatory Visit: Payer: Self-pay

## 2020-10-01 VITALS — BP 126/84 | HR 80 | Temp 97.9°F | Ht 65.0 in | Wt 100.0 lb

## 2020-10-01 DIAGNOSIS — Z720 Tobacco use: Secondary | ICD-10-CM | POA: Diagnosis not present

## 2020-10-01 DIAGNOSIS — J432 Centrilobular emphysema: Secondary | ICD-10-CM | POA: Diagnosis not present

## 2020-10-01 MED ORDER — SPIRIVA RESPIMAT 2.5 MCG/ACT IN AERS: 2.0000 | INHALATION_SPRAY | Freq: Every day | RESPIRATORY_TRACT | 3 refills | Status: DC

## 2020-10-01 MED ORDER — SPIRIVA HANDIHALER 18 MCG IN CAPS: 18.0000 ug | ORAL_CAPSULE | Freq: Every day | RESPIRATORY_TRACT | 3 refills | Status: DC

## 2020-10-01 MED ORDER — SPIRIVA RESPIMAT 2.5 MCG/ACT IN AERS: 2.0000 | INHALATION_SPRAY | Freq: Every day | RESPIRATORY_TRACT | 0 refills | Status: DC

## 2020-10-01 NOTE — Patient Instructions (Addendum)
Emphysema Mild bronchiectasis --REFILL Spiriva 18 mcg ONE inhalation daily. Sample provided. --CONTINUE Albuterol as needed for shortness of breath or wheezing  Tobacco abuse Patient is an active smoker. We discussed smoking cessation for 5 minutes. We discussed triggers and stressors and ways to deal with them. We discussed barriers to continued smoking and benefits of smoking cessation. Provided patient with information cessation techniques and interventions including [place] quitline.   Follow-up with me in 6 months   Tobacco Use Disorder Tobacco use disorder (TUD) occurs when a person craves, seeks, and uses tobacco, regardless of the consequences. This disorder can cause problems with mental and physical health. It can affect your ability to have healthy relationships, and it can keep you from meeting your responsibilities at work, home, or school. Tobacco may be:  Smoked as a cigarette or cigar.  Inhaled using e-cigarettes.  Smoked in a pipe or hookah.  Chewed as smokeless tobacco.  Inhaled into the nostrils as snuff. Tobacco products contain a dangerous chemical called nicotine, which is very addictive. Nicotine triggers hormones that make the body feel stimulated and works on areas of the brain that make you feel good. These effects can make it hard for people to quit nicotine. Tobacco contains many other unsafe chemicals that can damage almost every organ in the body. Smoking tobacco also puts others in danger due to fire risk and possible health problems caused by breathing in secondhand smoke. What are the signs or symptoms? Symptoms of TUD may include:  Being unable to slow down or stop your tobacco use.  Spending an abnormal amount of time getting or using tobacco.  Craving tobacco. Cravings may last for up to 6 months after quitting.  Tobacco use that: ? Interferes with your work, school, or home life. ? Interferes with your personal and social relationships. ? Makes  you give up activities that you once enjoyed or found important.  Using tobacco even though you know that it is: ? Dangerous or bad for your health or someone else's health. ? Causing problems in your life.  Needing more and more of the substance to get the same effect (developing tolerance).  Experiencing unpleasant symptoms if you do not use the substance (withdrawal). Withdrawal symptoms may include: ? Depressed, anxious, or irritable mood. ? Difficulty concentrating. ? Increased appetite. ? Restlessness or trouble sleeping.  Using the substance to avoid withdrawal. How is this diagnosed? This condition may be diagnosed based on:  Your current and past tobacco use. Your health care provider may ask questions about how your tobacco use affects your life.  A physical exam. You may be diagnosed with TUD if you have at least two symptoms within a 64-month period. How is this treated? This condition is treated by stopping tobacco use. Many people are unable to quit on their own and need help. Treatment may include:  Nicotine replacement therapy (NRT). NRT provides nicotine without the other harmful chemicals in tobacco. NRT gradually lowers the dosage of nicotine in the body and reduces withdrawal symptoms. NRT is available as: ? Over-the-counter gums, lozenges, and skin patches. ? Prescription mouth inhalers and nasal sprays.  Medicine that acts on the brain to reduce cravings and withdrawal symptoms.  A type of talk therapy that examines your triggers for tobacco use, how to avoid them, and how to cope with cravings (behavioral therapy).  Hypnosis. This may help with withdrawal symptoms.  Joining a support group for others coping with TUD. The best treatment for TUD is usually  a combination of medicine, talk therapy, and support groups. Recovery can be a long process. Many people start using tobacco again after stopping (relapse). If you relapse, it does not mean that treatment  will not work. Follow these instructions at home: Lifestyle  Do not use any products that contain nicotine or tobacco, such as cigarettes and e-cigarettes.  Avoid things that trigger tobacco use as much as you can. Triggers include people and situations that usually cause you to use tobacco.  Avoid drinks that contain caffeine, including coffee. These may worsen some withdrawal symptoms.  Find ways to manage stress. Wanting to smoke may cause stress, and stress can make you want to smoke. Relaxation techniques such as deep breathing, meditation, and yoga may help.  Attend support groups as needed. These groups are an important part of long-term recovery for many people. General instructions  Take over-the-counter and prescription medicines only as told by your health care provider.  Check with your health care provider before taking any new prescription or over-the-counter medicines.  Decide on a friend, family member, or smoking quit-line (such as 1-800-QUIT-NOW in the [place] that you can call or text when you feel the urge to smoke or when you need help coping with cravings.  Keep all follow-up visits as told by your health care provider and therapist. This is important.   Contact a health care provider if:  You are not able to take your medicines as prescribed.  Your symptoms get worse, even with treatment. Summary  Tobacco use disorder (TUD) occurs when a person craves, seeks, and uses tobacco regardless of the consequences.  This condition may be diagnosed based on your current and past tobacco use and a physical exam.  Many people are unable to quit on their own and need help. Recovery can be a long process.  The most effective treatment for TUD is usually a combination of medicine, talk therapy, and support groups. This information is not intended to replace advice given to you by your health care provider. Make sure you discuss any questions you have with your health care  provider. Document Revised: 07/01/2017 Document Reviewed: 07/01/2017 Elsevier Patient Education  2021 [employer].

## 2020-10-01 NOTE — Progress Notes (Signed)
Subjective:   PATIENT ID: Brandi Bean: female DOB: 03/03/1951, MRN: [id number]   HPI  Chief Complaint  Patient presents with  . Follow-up    Sob same. Smokers cough.   Reason for Visit: Follow-up  [name] is a 70 year old female active smoker with emphysema who presents for follow-up.  She has been copliant with her Spiriva. She continues to have productive cough with sputum production. Denies wheezing or shortness of breath. Her symptoms do not limit activity. However states she uses her albuterol one to two times a day. She has able to perform housework without issues. No recent ED/urgent care visit except for covid in April 22, 2020. She recently had a death in the family which has made quitting smoking difficulty. She is down to 1/2 ppd which is better than 1 ppd.   Social History: Active smoker She cares for multiple family members including a younger brother with intellectual disability  I have personally reviewed patient's past medical/family/social history/allergies/current medications.  Past Medical History:  Diagnosis Date  . Anxiety   . Arthritis   . Back pain   . COPD (chronic obstructive pulmonary disease) ([place])   . Depression   . Dyspnea    W/ EXERTION+   WHEEZING  . Fibromyalgia   . Headache   . Hypertension   . PONV (postoperative nausea and vomiting)   . Sleep apnea    ?   MILD NO MACHINE ORDERED, TO BE RETESTED AT HOME AFTER SURGERY  . Stroke [facility])    MINI STROKE   15-20 YRS AGO  . UTI (urinary tract infection)     Outpatient Medications Prior to Visit  Medication Sig Dispense Refill  . albuterol (PROVENTIL HFA;VENTOLIN HFA) 108 (90 BASE) MCG/ACT inhaler Inhale 2 puffs into the lungs every 6 (six) hours as needed for wheezing or shortness of breath.    Marland Kitchen atorvastatin (LIPITOR) 20 MG tablet Take 20 mg by mouth daily at 6 PM.    . bisoprolol-hydrochlorothiazide (ZIAC) 10-6.25 MG per tablet Take 1 tablet by mouth every evening.    Marland Kitchen  guaifenesin (ROBITUSSIN) 100 MG/5ML syrup Take 200 mg by mouth at bedtime as needed for cough.    Marland Kitchen ibuprofen (ADVIL,MOTRIN) 200 MG tablet Take 400 mg by mouth every 8 (eight) hours as needed for mild pain.    Marland Kitchen lidocaine (LIDODERM) 5 % Place 1 patch onto the skin daily as needed (pain). Remove & Discard patch within 12 hours or as directed by MD    . oxyCODONE-acetaminophen (PERCOCET) 10-325 MG tablet Take 1-2 tablets by mouth every 4 (four) hours as needed for pain. (Patient taking differently: Take 1 tablet by mouth 2 (two) times daily as needed for pain.) 60 tablet 0  . SPIRIVA HANDIHALER 18 MCG inhalation capsule Place into inhaler and inhale daily.    . Tiotropium Bromide Monohydrate (SPIRIVA RESPIMAT) 1.25 MCG/ACT AERS Inhale 2 puffs into the lungs daily. 1 each 0   No facility-administered medications prior to visit.    Review of Systems  Constitutional: Negative for chills, diaphoresis, fever, malaise/fatigue and weight loss.  HENT: Negative for congestion.   Respiratory: Positive for cough and sputum production. Negative for hemoptysis, shortness of breath and wheezing.   Cardiovascular: Negative for chest pain, palpitations and leg swelling.   Objective:   Vitals:   10/01/20 1557  BP: 126/84  Pulse: 80  Temp: 97.9 F (36.6 C)  SpO2: 94%  Weight: 100 lb (45.4 kg)  Height: 5'  5" (1.651 m)     Physical Exam: General: Well-appearing, no acute distress HENT: [place], AT Eyes: EOMI, no scleral icterus Respiratory: Clear to auscultation bilaterally.  No crackles, wheezing or rales Cardiovascular: RRR, -M/R/G, no JVD Extremities:-Edema,-tenderness Neuro: AAO x4, CNII-XII grossly intact Psych: Normal mood, normal affect  Data Reviewed:  Imaging: CT 03/23/19 - Mild bronchiectasis with interval development of tree-in-bud opacities in the mid-lower left lung. Mild emphysema. Stable sub-centimeter lung nodules including in RUL  CT Chest 06/20/19 - Slightly improved tree-in-bud  opacities in LLL. Some mucous impaction in LLL. Findings may suggestive of aspiration. Mild centrilobular emphysema  CT Lung screen 04/06/20 - Multiple small pulmonary nodules with largest measured 5.28mm. Emphysema.  PFT: 02/07/14 FVC 1.9 (61%) FEV1 1.4 (58%) Ratio 74  Interpretation: No evidence of obstruction. Reduced FEV1 and FVC may suggest poor effort or restrictive defect  Micro: Sputum 04/27/19 - Pseudomonas aeruginosa, streptococcus Fungal 04/27/19- Aspergillus [place] [employer] 04/27/19 - Neg  Assessment & Plan:   Discussion: 70 year old female active smoker with emphysema who presents for follow-up. Unable to tolerate Trelegy due to thrush. Her symptoms remain uncontrolled on LAMA alone. We discussed how smoking cessation can improve her symptoms. We discussed LAMA/LABA however patient wishes to hold off on stepping up therapy due to costs of inhalers.  Emphysema - uncontrolled Mild bronchiectasis --REFILL Spiriva 18 mcg ONE inhalation daily. Sample of Spiriva Respimat provided. --CONTINUE Albuterol as needed for shortness of breath or wheezing  Tobacco abuse Patient is an active smoker. We discussed smoking cessation for 5 minutes. We discussed triggers and stressors and ways to deal with them. We discussed barriers to continued smoking and benefits of smoking cessation. Provided patient with information cessation techniques and interventions including [place] quitline.  Health Maintenance Immunization History  Administered Date(s) Administered  . Fluad Quad(high Dose 65+) 04/25/2019  . Influenza Split 05/28/2012, 08/28/2014  . Influenza Whole 05/14/2009, 04/07/2011  . Moderna Sars-Covid-2 Vaccination 09/02/2019, 09/30/2019  . Pneumococcal Conjugate-13 02/19/2015  . Pneumococcal Polysaccharide-23 05/28/2012   CT Lung Screen - Due 03/2021  No orders of the defined types were placed in this encounter.  Meds ordered this encounter  Medications  . SPIRIVA HANDIHALER 18 MCG inhalation  capsule    Sig: Place 1 capsule (18 mcg total) into inhaler and inhale daily.    Dispense:  30 capsule    Refill:  3  . DISCONTD: Tiotropium Bromide Monohydrate (SPIRIVA RESPIMAT) 2.5 MCG/ACT AERS    Sig: Inhale 2 puffs into the lungs daily.    Dispense:  4 g    Refill:  3  . Tiotropium Bromide Monohydrate (SPIRIVA RESPIMAT) 2.5 MCG/ACT AERS    Sig: Inhale 2 puffs into the lungs daily.    Dispense:  4 g    Refill:  0    Order Specific Question:   Lot Number?    Answer:   [id number]    Order Specific Question:   Expiration Date?    Answer:   01/09/2022  . DISCONTD: Tiotropium Bromide Monohydrate (SPIRIVA RESPIMAT) 2.5 MCG/ACT AERS    Sig: Inhale 2 puffs into the lungs daily.    Dispense:  4 g    Refill:  3   Return in about 6 months (around 04/03/2021).  [place], MD [facility] 10/01/2020 3:52 PM  Office Number [phone]

## 2020-10-02 ENCOUNTER — Telehealth: Payer: Self-pay | Admitting: Pulmonary Disease

## 2020-10-02 NOTE — Telephone Encounter (Signed)
Dr. Loanne Drilling, please advise on this which Spiriva inhaler pt is supposed to be on as looks like multiple were sent to pt's pharmacy.

## 2020-10-03 ENCOUNTER — Other Ambulatory Visit: Payer: Self-pay

## 2020-10-03 NOTE — Telephone Encounter (Signed)
Spoke with Sherren Mocha at [facility]  I updated med list  Nothing further needed

## 2020-10-03 NOTE — Telephone Encounter (Signed)
Please clean up her med list to reflect the following:  REFILL Spiriva 18 mcg daily. This is her long term medication  Spiriva Respimat 2.5 mcg TWO puffs daily - sample only.

## 2020-10-16 DIAGNOSIS — I700 Atherosclerosis of aorta: Secondary | ICD-10-CM | POA: Diagnosis not present

## 2020-10-16 DIAGNOSIS — Z681 Body mass index (BMI) 19 or less, adult: Secondary | ICD-10-CM | POA: Diagnosis not present

## 2020-10-16 DIAGNOSIS — G894 Chronic pain syndrome: Secondary | ICD-10-CM | POA: Diagnosis not present

## 2020-10-16 DIAGNOSIS — I10 Essential (primary) hypertension: Secondary | ICD-10-CM | POA: Diagnosis not present

## 2020-10-16 DIAGNOSIS — M5451 Vertebrogenic low back pain: Secondary | ICD-10-CM | POA: Diagnosis not present

## 2020-10-16 DIAGNOSIS — E270 Other adrenocortical overactivity: Secondary | ICD-10-CM | POA: Diagnosis not present

## 2020-10-16 DIAGNOSIS — J439 Emphysema, unspecified: Secondary | ICD-10-CM | POA: Diagnosis not present

## 2020-10-16 DIAGNOSIS — M1991 Primary osteoarthritis, unspecified site: Secondary | ICD-10-CM | POA: Diagnosis not present

## 2020-10-24 DIAGNOSIS — E7849 Other hyperlipidemia: Secondary | ICD-10-CM | POA: Diagnosis not present

## 2020-10-24 DIAGNOSIS — I10 Essential (primary) hypertension: Secondary | ICD-10-CM | POA: Diagnosis not present

## 2020-10-25 ENCOUNTER — Other Ambulatory Visit (HOSPITAL_COMMUNITY): Payer: Self-pay | Admitting: Internal Medicine

## 2020-10-25 DIAGNOSIS — Z1231 Encounter for screening mammogram for malignant neoplasm of breast: Secondary | ICD-10-CM

## 2020-11-07 ENCOUNTER — Ambulatory Visit (HOSPITAL_COMMUNITY)
Admission: RE | Admit: 2020-11-07 | Discharge: 2020-11-07 | Disposition: A | Discharge status: Home / Self Care | Payer: Medicare Other | Source: Ambulatory Visit | Attending: Acute Care | Admitting: Acute Care

## 2020-11-07 DIAGNOSIS — J432 Centrilobular emphysema: Secondary | ICD-10-CM | POA: Diagnosis not present

## 2020-11-07 DIAGNOSIS — F17210 Nicotine dependence, cigarettes, uncomplicated: Secondary | ICD-10-CM | POA: Diagnosis not present

## 2020-11-07 DIAGNOSIS — Z87891 Personal history of nicotine dependence: Secondary | ICD-10-CM

## 2020-11-07 DIAGNOSIS — I2510 Atherosclerotic heart disease of native coronary artery without angina pectoris: Secondary | ICD-10-CM | POA: Diagnosis not present

## 2020-11-07 DIAGNOSIS — S2241XA Multiple fractures of ribs, right side, initial encounter for closed fracture: Secondary | ICD-10-CM | POA: Diagnosis not present

## 2020-11-07 DIAGNOSIS — J929 Pleural plaque without asbestos: Secondary | ICD-10-CM | POA: Diagnosis not present

## 2020-11-16 ENCOUNTER — Ambulatory Visit (HOSPITAL_COMMUNITY)
Admission: RE | Admit: 2020-11-16 | Discharge: 2020-11-16 | Disposition: A | Discharge status: Home / Self Care | Payer: Medicare Other | Source: Ambulatory Visit | Attending: Internal Medicine | Admitting: Internal Medicine

## 2020-11-16 ENCOUNTER — Other Ambulatory Visit: Payer: Self-pay

## 2020-11-16 DIAGNOSIS — Z1231 Encounter for screening mammogram for malignant neoplasm of breast: Secondary | ICD-10-CM | POA: Insufficient documentation

## 2020-11-20 ENCOUNTER — Other Ambulatory Visit: Payer: Self-pay | Admitting: *Deleted

## 2020-11-20 DIAGNOSIS — F17210 Nicotine dependence, cigarettes, uncomplicated: Secondary | ICD-10-CM

## 2020-11-20 DIAGNOSIS — Z87891 Personal history of nicotine dependence: Secondary | ICD-10-CM

## 2020-11-20 NOTE — Progress Notes (Signed)
Please call patient and let them  know their  low dose Ct was read as a Lung  RADS 3, nodules that are probably benign findings, short term follow up suggested: includes nodules with a low likelihood of becoming a clinically active cancer. Radiology recommends a 6 month repeat LDCT follow up. .Please let them  know we will order and schedule their  annual screening scan for 04/2021 Please let them  know there was notation of CAD on their  scan.  Please remind the patient  that this is a non-gated exam therefore degree or severity of disease  cannot be determined. Please have them  follow up with their PCP regarding potential risk factor modification, dietary therapy or pharmacologic therapy if clinically indicated. Pt.  is not  currently on statin therapy. Please place order for 6 month  screening scan for  04/2021 and fax results to PCP. Thanks so much.  Langley Gauss, please ask her if she has been sick. If she has can you place her on my schedule for televisit / or office visit top discuss. Thanks so much

## 2020-11-26 ENCOUNTER — Encounter: Payer: Self-pay | Admitting: Acute Care

## 2020-11-26 ENCOUNTER — Other Ambulatory Visit: Payer: Self-pay

## 2020-11-26 ENCOUNTER — Ambulatory Visit (INDEPENDENT_AMBULATORY_CARE_PROVIDER_SITE_OTHER): Payer: Medicare Other | Admitting: Acute Care

## 2020-11-26 VITALS — BP 122/84 | HR 81 | Temp 98.6°F | Ht 65.0 in | Wt 99.2 lb

## 2020-11-26 DIAGNOSIS — R918 Other nonspecific abnormal finding of lung field: Secondary | ICD-10-CM | POA: Diagnosis not present

## 2020-11-26 DIAGNOSIS — R630 Anorexia: Secondary | ICD-10-CM

## 2020-11-26 DIAGNOSIS — J440 Chronic obstructive pulmonary disease with acute lower respiratory infection: Secondary | ICD-10-CM

## 2020-11-26 DIAGNOSIS — J181 Lobar pneumonia, unspecified organism: Secondary | ICD-10-CM

## 2020-11-26 DIAGNOSIS — F17210 Nicotine dependence, cigarettes, uncomplicated: Secondary | ICD-10-CM

## 2020-11-26 DIAGNOSIS — R634 Abnormal weight loss: Secondary | ICD-10-CM

## 2020-11-26 MED ORDER — DOXYCYCLINE HYCLATE 100 MG PO TABS: 100.0000 mg | ORAL_TABLET | Freq: Two times a day (BID) | ORAL | 0 refills | Status: DC

## 2020-11-26 MED ORDER — SPIRIVA RESPIMAT 2.5 MCG/ACT IN AERS: 2.0000 | INHALATION_SPRAY | Freq: Every day | RESPIRATORY_TRACT | 0 refills | Status: DC

## 2020-11-26 NOTE — Progress Notes (Signed)
History of Present Illness Brandi Bean is a 70 y.o. female active smoker with emphysema.She  Tested  Positive for Covid  on 04/23/20. She is followed by Dr. Loanne Drilling. Currently smoking 1/2 PPD.  30 pack year smoking history Maintenance Spiriva Respimat Rescue Albuterol  11/26/2020  Pt. Presents for follow up after Low Dose CT. Her scan was read as a Lung  RADS 3, nodules that are probably benign findings, short term follow up suggested: includes nodules with a low likelihood of becoming a clinically active cancer. Radiology recommends a 6 month repeat LDCT follow up. Additionally they recommended antimicrobial therapy as there is a LLL residual consolidation, and imaging consistent with chronic aspiration  . Pt. States she has a cough, but she does not endorse any coughing of choking on food. She is not interested in getting a swallow study at this point, but will consider after antibiotic treatment and follow up CT Chest in 6 months.   She also endorses a 45 pound weight loss over the last 3 years. She takes chronic pain medication for back pain. I have encouraged her to follow up with her PCP regarding her back pain and weight loss. She sees her PCP every 2 months as she has a pain medication contract with him.She states she will make sure she discusses these issues with him.  She is compliant with her Spiriva and uses her rescue about once daily. She states she is having a stable interval at present. We will provide her with some samples of Spiriva  today at her request. She is due for follow up with Dr. Loanne Drilling in October. I will follow up after repeat CT in October 2022  Test Results:  10/2020 LDCT No mediastinal or definite hilar adenopathy, given limitations of unenhanced CT.  Lungs/Pleura: No pleural fluid. Moderate centrilobular emphysema. Lower lobe predominant bronchial wall thickening. Fluid/secretions within medial right lower lobe bronchi, new. Left  endobronchial fluid/secretions are improved.  Scattered bilateral pulmonary nodules are again identified.  The clustered nodularity in the left lower lobe is slightly improved. There is residual medial consolidation.  Left lower lobe anterolateral nodule or area of mucoid impaction is new at volume derived equivalent diameter 5.0 mm on 195/4.  Lung-RADS 3, probably benign findings. Short-term follow-up in 6 months is recommended with repeat low-dose chest CT without contrast (please use the following order, "CT CHEST LCS NODULE FOLLOW-UP W/O CM"). Although the left lower lobe nodularity of interest is slightly improved, there is a new more lateral left lower lobe nodule or area of mucoid impaction. Given concurrent endobronchial fluid/secretions within the right lower lobe today and within the left lower lobe on the prior exam, findings are favored to be related to chronic aspiration or less likely atypical infection. Consider antibiotic therapy prior to CT follow-up. Aortic atherosclerosis (ICD10-I70.0), coronary artery atherosclerosis and emphysema (ICD10-J43.9). Left adrenal adenoma.   Imaging: CT 03/23/19 - Mild bronchiectasis with interval development of tree-in-bud opacities in the mid-lower left lung. Mild emphysema. Stable sub-centimeter lung nodules including in RUL  CT Chest 06/20/19 - Slightly improved tree-in-bud opacities in LLL. Some mucous impaction in LLL. Findings may suggestive of aspiration. Mild centrilobular emphysema  CT Lung screen 04/06/20 - Multiple small pulmonary nodules with largest measured 5.64mm. Emphysema.  PFT: 02/07/14 FVC 1.9 (61%) FEV1 1.4 (58%) Ratio 74  Interpretation: No evidence of obstruction. Reduced FEV1 and FVC may suggest poor effort or restrictive defect  Micro: Sputum 04/27/19 - Pseudomonas aeruginosa, streptococcus Fungal 04/27/19- Aspergillus [place]  AFB 04/27/19 - Neg   CBC Latest Ref Rng & Units 02/18/2017 07/30/2016  05/18/2007  WBC 4.0 - 10.5 K/uL 11.9(H) 10.1 9.3  Hemoglobin 12.0 - 15.0 g/dL 17.7(H) 17.1(H) 15.3(H)  Hematocrit 36.0 - 46.0 % 51.6(H) 49.3(H) 43.6  Platelets 150 - 400 K/uL 246 209 242    BMP Latest Ref Rng & Units 02/18/2017 07/30/2016 11/01/2007  Glucose 65 - 99 mg/dL 182(H) 99 101(H)  BUN 6 - 20 mg/dL 12 10 9   Creatinine 0.44 - 1.00 mg/dL 1.07(H) 0.95 1.0  Sodium 135 - 145 mmol/L 135 141 146(H)  Potassium 3.5 - 5.1 mmol/L 3.3(L) 4.1 3.7  Chloride 101 - 111 mmol/L 97(L) 105 108  CO2 22 - 32 mmol/L 27 30 32  Calcium 8.9 - 10.3 mg/dL 9.4 9.8 9.4    BNP No results found for: BNP  ProBNP No results found for: PROBNP  PFT No results found for: FEV1PRE, FEV1POST, FVCPRE, FVCPOST, TLC, DLCOUNC, PREFEV1FVCRT, PSTFEV1FVCRT  MM 3D SCREEN BREAST BILATERAL  Result Date: 11/19/2020 CLINICAL DATA:  Screening. EXAM: DIGITAL SCREENING BILATERAL MAMMOGRAM WITH TOMOSYNTHESIS AND CAD TECHNIQUE: Bilateral screening digital craniocaudal and mediolateral oblique mammograms were obtained. Bilateral screening digital breast tomosynthesis was performed. The images were evaluated with computer-aided detection. COMPARISON:  Previous exam(s). ACR Breast Density Category b: There are scattered areas of fibroglandular density. FINDINGS: There are no findings suspicious for malignancy. The images were evaluated with computer-aided detection. IMPRESSION: No mammographic evidence of malignancy. A result letter of this screening mammogram will be mailed directly to the patient. RECOMMENDATION: Screening mammogram in one year. (Code:SM-B-01Y) BI-RADS CATEGORY  1: Negative. Electronically Signed   By: Margarette Canada M.D.   On: 11/19/2020 11:38   CT CHEST LCS NODULE F/U W/O CONTRAST  Result Date: 11/09/2020 CLINICAL DATA:  43 pack-year smoking history. Current smoker. Short-term follow-up. EXAM: CT CHEST WITHOUT CONTRAST FOR LUNG CANCER SCREENING NODULE FOLLOW-UP TECHNIQUE: Multidetector CT imaging of the chest was  performed following the standard protocol without IV contrast. COMPARISON:  04/06/2020 FINDINGS: Cardiovascular: Aortic and branch vessel atherosclerosis. Normal heart size, without pericardial effusion. Multivessel coronary artery atherosclerosis. Mediastinum/Nodes: No mediastinal or definite hilar adenopathy, given limitations of unenhanced CT. Lungs/Pleura: No pleural fluid. Moderate centrilobular emphysema. Lower lobe predominant bronchial wall thickening. Fluid/secretions within medial right lower lobe bronchi, new. Left endobronchial fluid/secretions are improved. Scattered bilateral pulmonary nodules are again identified. The clustered nodularity in the left lower lobe is slightly improved. There is residual medial consolidation. Left lower lobe anterolateral nodule or area of mucoid impaction is new at volume derived equivalent diameter 5.0 mm on 195/4. Upper Abdomen: Normal imaged portions of the liver, spleen, stomach, pancreas, left kidney. Minimal right adrenal thickening. Left adrenal 1.4 cm low-density nodule. Musculoskeletal: Remote right rib fractures. Cervical spine fixation. IMPRESSION: 1. Lung-RADS 3, probably benign findings. Short-term follow-up in 6 months is recommended with repeat low-dose chest CT without contrast (please use the following order, "CT CHEST LCS NODULE FOLLOW-UP W/O CM"). Although the left lower lobe nodularity of interest is slightly improved, there is a new more lateral left lower lobe nodule or area of mucoid impaction. Given concurrent endobronchial fluid/secretions within the right lower lobe today and within the left lower lobe on the prior exam, findings are favored to be related to chronic aspiration or less likely atypical infection. Consider antibiotic therapy prior to CT follow-up. 2. Aortic atherosclerosis (ICD10-I70.0), coronary artery atherosclerosis and emphysema (ICD10-J43.9). 3. Left adrenal adenoma. Electronically Signed   By: Adria Devon.D.  On:  11/09/2020 08:31     Past medical hx Past Medical History:  Diagnosis Date  . Anxiety   . Arthritis   . Back pain   . COPD (chronic obstructive pulmonary disease) ([place])   . Depression   . Dyspnea    W/ EXERTION+   WHEEZING  . Fibromyalgia   . Headache   . Hypertension   . PONV (postoperative nausea and vomiting)   . Sleep apnea    ?   MILD NO MACHINE ORDERED, TO BE RETESTED AT HOME AFTER SURGERY  . Stroke [facility])    MINI STROKE   15-20 YRS AGO  . UTI (urinary tract infection)      Social History   Tobacco Use  . Smoking status: Current Every Day Smoker    Packs/day: 1.00    Years: 30.00    Pack years: 30.00    Types: Cigarettes    Start date: 36  . Smokeless tobacco: Never Used  . Tobacco comment: 10/01/2020- half pack a day  Vaping Use  . Vaping Use: Some days  Substance Use Topics  . Alcohol use: No    Alcohol/week: 0.0 standard drinks  . Drug use: No    Ms.Halbert reports that she has been smoking cigarettes. She started smoking about 30 years ago. She has a 30.00 pack-year smoking history. She has never used smokeless tobacco. She reports that she does not drink alcohol and does not use drugs.  Tobacco Cessation: Pt. Is not ready to quit smoking. She has been counseled. She has a 30 pack year smoking history She is currently smoking 1/2 PPD.   Past surgical hx, Family hx, Social hx all reviewed.  Current Outpatient Medications on File Prior to Visit  Medication Sig  . albuterol (PROVENTIL HFA;VENTOLIN HFA) 108 (90 BASE) MCG/ACT inhaler Inhale 2 puffs into the lungs every 6 (six) hours as needed for wheezing or shortness of breath.  Marland Kitchen atorvastatin (LIPITOR) 20 MG tablet Take 20 mg by mouth daily at 6 PM.  . bisoprolol-hydrochlorothiazide (ZIAC) 10-6.25 MG per tablet Take 1 tablet by mouth every evening.  Marland Kitchen guaifenesin (ROBITUSSIN) 100 MG/5ML syrup Take 200 mg by mouth at bedtime as needed for cough.  Marland Kitchen ibuprofen (ADVIL,MOTRIN) 200 MG tablet Take 400 mg by  mouth every 8 (eight) hours as needed for mild pain.  Marland Kitchen lidocaine (LIDODERM) 5 % Place 1 patch onto the skin daily as needed (pain). Remove & Discard patch within 12 hours or as directed by MD  . oxyCODONE (ROXICODONE) 15 MG immediate release tablet Take 15 mg by mouth every 4 (four) hours as needed.  Marland Kitchen oxyCODONE-acetaminophen (PERCOCET) 10-325 MG tablet Take 1-2 tablets by mouth every 4 (four) hours as needed for pain. (Patient taking differently: Take 1 tablet by mouth 2 (two) times daily as needed for pain.)  . SPIRIVA HANDIHALER 18 MCG inhalation capsule Place 1 capsule (18 mcg total) into inhaler and inhale daily.   No current facility-administered medications on file prior to visit.     Allergies  Allergen Reactions  . Codeine Sulfate Itching  . Hydrocodone Other (See Comments)    Passes out   . Latex Itching  . Prednisone Other (See Comments)    Unknown      Review Of Systems:  Constitutional:   +  weight loss,  No night sweats,  Fevers, chills, fatigue, or  lassitude.  HEENT:   No headaches,  Difficulty swallowing,  Tooth/dental problems, or  Sore throat,  No sneezing, itching, ear ache, nasal congestion, post nasal drip,   CV:  No chest pain,  Orthopnea, PND, swelling in lower extremities, anasarca, dizziness, palpitations, syncope.   GI  No heartburn, indigestion, abdominal pain, nausea, vomiting, diarrhea, change in bowel habits, loss of appetite, bloody stools.   Resp: No shortness of breath with exertion or at rest.  No excess mucus, + productive cough,  + non-productive cough,  No coughing up of blood.  No change in color of mucus.  No wheezing.  No chest wall deformity  Skin: no rash or lesions.  GU: no dysuria, change in color of urine, no urgency or frequency.  No flank pain, no hematuria   MS:  No joint pain or swelling.  No decreased range of motion.  + chronic  back pain.  Psych:  No change in mood or affect. No depression or anxiety.  No  memory loss.   Vital Signs BP 122/84 (BP Location: Left Arm, Cuff Size: Normal)   Pulse 81   Temp 98.6 F (37 C) (Oral)   Ht 5\' 5"  (1.651 m)   Wt 99 lb 3.2 oz (45 kg)   SpO2 97%   BMI 16.51 kg/m    Physical Exam:  General- No distress,  A&Ox3, pleasant ENT: No sinus tenderness, TM clear, pale nasal mucosa, no oral exudate,no post nasal drip, no LAN Cardiac: S1, S2, regular rate and rhythm, no murmur Chest: No wheeze/ rales/ dullness; no accessory muscle use, no nasal flaring, no sternal retractions Abd.: Soft Non-tender, ND, BS +, Body mass index is 16.51 kg/m. Ext: No clubbing cyanosis, edema Neuro:  normal strength, MAE x 4, A&O x 3 Skin: No rashes, NO lesions, warm and dry Psych: normal mood and behavior   Assessment/Plan Abnormal LDCT LR 3 with clustered nodularity in the left lower lobe is slightly improved. There is residual medial consolidation. 6 month follow up with antibiotic therapy recommended.  Suspicion of chronic aspiration per imaging>> pt denies getting choked on food. Plan We will send in a prescription for Doxycycline 100 mg twice daily x 1 week to treat your lower respiratory infection. This medication will make you sensitive to the sun. Use Sunblock if you are outside. Be careful with Dairy products while on this medication, separate taking medication from any daily by 2 hours.. Take Probiotic, Culturelle once daily, these can be purchased over the counter.  Follow up CT Chest in 6 months>> We will call you to get this scheduled. Scan has been ordered If you still have what looks like aspiration on the repeat CT Chest, we will consider a swallow evaluation at that time.   ? Aspiration Plan Will re-evaluate need after antimicrobial therapy and repeat imaging.  If appears to indicate chronic aspiration, will order swallow evaluation  Weight loss 45 pounds over the last 3 years Poor appetite Plan Declined referral to dietitian Asked patient to  follow up with her PCP.  Back Pain Plan Chronic pain management contract with PCP Follow up with PCP  Tobacco Cessation Plan Continue to work on quitting smoking This is the single most powerful action you can take to decrease your risk of lung cancer, heart disease and stroke, in addition to worsening pulmonary disease. I have spent 3 minutes counseling patient on smoking cessation this visit. We have reviewed the risks of continued smoking on his current health situation. Patient verbalizes understanding of their choice to continue smoking and the negative health consequences including worsening of COPD, risk of  lung cancer , stroke and heart disease.        Magdalen Spatz, NP 11/26/2020  10:49 AM

## 2020-11-26 NOTE — Patient Instructions (Addendum)
It is good to see you today. We will send in a prescription for Doxycycline 100 mg twice daily x 1 week. This medication will make you sensitive to the sun. Use Sunblock if you are outside. Be careful with Dairy products while on this medication. Take Probiotic, Culturelle once daily, these can be purchased over the counter.  Follow up CT Chest in 6 months>> We will call you to get this scheduled. If you still have what looks like aspiration on the repeat CT Chest, we will consider a swallow evaluation at that time.  Follow up with Dr. Loanne Drilling 03/2021 Continue to work on quitting smoking.  Let us know if you need [place] sooner . Please contact office for sooner follow up if symptoms do not improve or worsen or seek emergency care

## 2020-11-27 ENCOUNTER — Telehealth: Payer: Self-pay | Admitting: Acute Care

## 2020-11-27 MED ORDER — AZITHROMYCIN 250 MG PO TABS: ORAL_TABLET | ORAL | 0 refills | Status: DC

## 2020-11-27 NOTE — Telephone Encounter (Signed)
ATC patient unable to reach LM to call back office (x1)  Added doxy to her medication list as an allergy  Have not placed order to ABX (zpak) needs to be ordered once spoken to patient.

## 2020-11-27 NOTE — Telephone Encounter (Signed)
Called and spoke with patient to let her know recs from [facility]. Advised her to stop medication and if her symptoms get worse for her to seek emergency care. Medication has been added to her allergy list and new RX for z pak sent into preferred pharmacy. Nothing further needed at this time.

## 2020-11-27 NOTE — Telephone Encounter (Signed)
Tell her to stop taking it. Have her take a non-sedating antihistamine like Zyrtec/ Allegra, and pepcid 20 mg. Have her seek emergency care for any tongue swelling , or shortness of breath Please send in a z pack instead. Have her start it tomorrow morning.  Add Doxy to her med list and an allergy .  Thanks

## 2020-11-27 NOTE — Telephone Encounter (Signed)
Called and spoke with patient who states that she is calling because she took her first does of her abx and above her eye started stinging. Patient would like to know if she should continue to take it. Patient states that its not red, no bumps or rash just burning sensation and itching.   Sarah please advise

## 2020-12-31 DIAGNOSIS — M1991 Primary osteoarthritis, unspecified site: Secondary | ICD-10-CM | POA: Diagnosis not present

## 2020-12-31 DIAGNOSIS — Z720 Tobacco use: Secondary | ICD-10-CM | POA: Diagnosis not present

## 2020-12-31 DIAGNOSIS — G894 Chronic pain syndrome: Secondary | ICD-10-CM | POA: Diagnosis not present

## 2020-12-31 DIAGNOSIS — R109 Unspecified abdominal pain: Secondary | ICD-10-CM | POA: Diagnosis not present

## 2020-12-31 DIAGNOSIS — Z681 Body mass index (BMI) 19 or less, adult: Secondary | ICD-10-CM | POA: Diagnosis not present

## 2021-01-24 DIAGNOSIS — I10 Essential (primary) hypertension: Secondary | ICD-10-CM | POA: Diagnosis not present

## 2021-01-24 DIAGNOSIS — E7849 Other hyperlipidemia: Secondary | ICD-10-CM | POA: Diagnosis not present

## 2021-02-13 DIAGNOSIS — M1991 Primary osteoarthritis, unspecified site: Secondary | ICD-10-CM | POA: Diagnosis not present

## 2021-02-13 DIAGNOSIS — J449 Chronic obstructive pulmonary disease, unspecified: Secondary | ICD-10-CM | POA: Diagnosis not present

## 2021-02-13 DIAGNOSIS — G894 Chronic pain syndrome: Secondary | ICD-10-CM | POA: Diagnosis not present

## 2021-03-07 ENCOUNTER — Other Ambulatory Visit: Payer: Self-pay

## 2021-03-07 ENCOUNTER — Other Ambulatory Visit (HOSPITAL_COMMUNITY): Payer: Self-pay | Admitting: Internal Medicine

## 2021-03-07 ENCOUNTER — Ambulatory Visit (HOSPITAL_COMMUNITY)
Admission: RE | Admit: 2021-03-07 | Discharge: 2021-03-07 | Disposition: A | Discharge status: Home / Self Care | Payer: Medicare Other | Source: Ambulatory Visit | Attending: Internal Medicine | Admitting: Internal Medicine

## 2021-03-07 DIAGNOSIS — M549 Dorsalgia, unspecified: Secondary | ICD-10-CM | POA: Diagnosis not present

## 2021-03-07 DIAGNOSIS — M1991 Primary osteoarthritis, unspecified site: Secondary | ICD-10-CM | POA: Diagnosis not present

## 2021-03-07 DIAGNOSIS — M47816 Spondylosis without myelopathy or radiculopathy, lumbar region: Secondary | ICD-10-CM | POA: Diagnosis not present

## 2021-03-07 DIAGNOSIS — M545 Low back pain, unspecified: Secondary | ICD-10-CM | POA: Diagnosis not present

## 2021-03-07 DIAGNOSIS — J449 Chronic obstructive pulmonary disease, unspecified: Secondary | ICD-10-CM | POA: Diagnosis not present

## 2021-03-07 DIAGNOSIS — G894 Chronic pain syndrome: Secondary | ICD-10-CM | POA: Diagnosis not present

## 2021-03-07 DIAGNOSIS — M5431 Sciatica, right side: Secondary | ICD-10-CM | POA: Diagnosis not present

## 2021-03-07 DIAGNOSIS — Z681 Body mass index (BMI) 19 or less, adult: Secondary | ICD-10-CM | POA: Diagnosis not present

## 2021-04-15 ENCOUNTER — Other Ambulatory Visit (HOSPITAL_COMMUNITY): Payer: Self-pay | Admitting: Internal Medicine

## 2021-04-15 ENCOUNTER — Other Ambulatory Visit: Payer: Self-pay | Admitting: Internal Medicine

## 2021-04-15 DIAGNOSIS — M5416 Radiculopathy, lumbar region: Secondary | ICD-10-CM | POA: Diagnosis not present

## 2021-04-15 DIAGNOSIS — J449 Chronic obstructive pulmonary disease, unspecified: Secondary | ICD-10-CM | POA: Diagnosis not present

## 2021-04-15 DIAGNOSIS — M79604 Pain in right leg: Secondary | ICD-10-CM | POA: Diagnosis not present

## 2021-04-15 DIAGNOSIS — T50905A Adverse effect of unspecified drugs, medicaments and biological substances, initial encounter: Secondary | ICD-10-CM | POA: Diagnosis not present

## 2021-04-15 DIAGNOSIS — I10 Essential (primary) hypertension: Secondary | ICD-10-CM | POA: Diagnosis not present

## 2021-04-15 DIAGNOSIS — M1991 Primary osteoarthritis, unspecified site: Secondary | ICD-10-CM | POA: Diagnosis not present

## 2021-04-15 DIAGNOSIS — Z681 Body mass index (BMI) 19 or less, adult: Secondary | ICD-10-CM | POA: Diagnosis not present

## 2021-04-15 DIAGNOSIS — Z23 Encounter for immunization: Secondary | ICD-10-CM | POA: Diagnosis not present

## 2021-04-19 ENCOUNTER — Ambulatory Visit (HOSPITAL_COMMUNITY)
Admission: RE | Admit: 2021-04-19 | Discharge: 2021-04-19 | Disposition: A | Discharge status: Home / Self Care | Payer: Medicare Other | Source: Ambulatory Visit | Attending: Internal Medicine | Admitting: Internal Medicine

## 2021-04-19 ENCOUNTER — Other Ambulatory Visit (HOSPITAL_COMMUNITY): Payer: Self-pay | Admitting: Internal Medicine

## 2021-04-19 ENCOUNTER — Other Ambulatory Visit: Payer: Self-pay

## 2021-04-19 DIAGNOSIS — I70213 Atherosclerosis of native arteries of extremities with intermittent claudication, bilateral legs: Secondary | ICD-10-CM | POA: Diagnosis not present

## 2021-04-19 DIAGNOSIS — M79604 Pain in right leg: Secondary | ICD-10-CM | POA: Diagnosis not present

## 2021-04-25 ENCOUNTER — Ambulatory Visit: Payer: Medicare Other | Admitting: Pulmonary Disease

## 2021-04-25 ENCOUNTER — Other Ambulatory Visit: Payer: Self-pay

## 2021-04-25 ENCOUNTER — Encounter: Payer: Self-pay | Admitting: Pulmonary Disease

## 2021-04-25 VITALS — BP 118/62 | HR 77 | Temp 97.7°F | Ht 65.0 in | Wt 96.8 lb

## 2021-04-25 DIAGNOSIS — J449 Chronic obstructive pulmonary disease, unspecified: Secondary | ICD-10-CM

## 2021-04-25 DIAGNOSIS — Z720 Tobacco use: Secondary | ICD-10-CM | POA: Diagnosis not present

## 2021-04-25 MED ORDER — SPIRIVA HANDIHALER 18 MCG IN CAPS: 18.0000 ug | ORAL_CAPSULE | Freq: Every day | RESPIRATORY_TRACT | 3 refills | Status: DC

## 2021-04-25 MED ORDER — SPIRIVA HANDIHALER 18 MCG IN CAPS
18.0000 ug | ORAL_CAPSULE | Freq: Every day | RESPIRATORY_TRACT | 3 refills | Status: DC
Start: 2021-04-25 — End: 2021-04-25

## 2021-04-25 MED ORDER — SPIRIVA RESPIMAT 2.5 MCG/ACT IN AERS: 1.0000 | INHALATION_SPRAY | Freq: Every day | RESPIRATORY_TRACT | 0 refills | Status: DC

## 2021-04-25 NOTE — Patient Instructions (Signed)
Emphysema - uncontrolled Mild bronchiectasis --REFILL Spiriva 18 mcg ONE inhalation daily --CONTINUE Albuterol as needed for shortness of breath or wheezing --Encourage regular aerobic activity including daily walking  Follow-up with me in 3 months

## 2021-04-25 NOTE — Progress Notes (Signed)
Subjective:   PATIENT ID: Brandi Bean GENDER: female DOB: 02/11/1951, MRN: [id number]   HPI  Chief Complaint  Patient presents with   Follow-up    Breathing is overall doing well today. She is still using spiriva respimat samples- has handihaler when this runs out. She has occ cough- prod with yellow sputum. Smoking about 3/4 ppd. She is using her albuterol inhaler 3 x per wk on average.    Reason for Visit: Follow-up  Brandi Bean is a 70 year old female active smoker with emphysema who presents for follow-up.  04/25/21 Since our last visit she was treated with antibiotics for abnormality seen on LDCT. She continues to be compliant with Spiriva. She uses albuterol once a week. Denies shortness of breath but sits on the couch. She will have a productive cough in the mornings. She still actively smokes 3/4 ppd. Daughter encourages her to quit smoking. Last exacerbation in 03/2020.  Social History: Active smoker She cares for multiple family members including a younger brother with intellectual disability  Past Medical History:  Diagnosis Date   Anxiety    Arthritis    Back pain    COPD (chronic obstructive pulmonary disease) (HCC)    Depression    Dyspnea    W/ EXERTION+   WHEEZING   Fibromyalgia    Headache    Hypertension    PONV (postoperative nausea and vomiting)    Sleep apnea    ?   MILD NO MACHINE ORDERED, TO BE RETESTED AT HOME AFTER SURGERY   Stroke [facility])    MINI STROKE   15-20 YRS AGO   UTI (urinary tract infection)     Outpatient Medications Prior to Visit  Medication Sig Dispense Refill   albuterol (PROVENTIL HFA;VENTOLIN HFA) 108 (90 BASE) MCG/ACT inhaler Inhale 2 puffs into the lungs every 6 (six) hours as needed for wheezing or shortness of breath.     atorvastatin (LIPITOR) 20 MG tablet Take 20 mg by mouth daily at 6 PM.     azithromycin (ZITHROMAX) 250 MG tablet Take 2 tablets first day then 1 tablet daily until finished. 6 tablet 0    bisoprolol-hydrochlorothiazide (ZIAC) 10-6.25 MG per tablet Take 1 tablet by mouth every evening.     guaifenesin (ROBITUSSIN) 100 MG/5ML syrup Take 200 mg by mouth at bedtime as needed for cough.     ibuprofen (ADVIL,MOTRIN) 200 MG tablet Take 400 mg by mouth every 8 (eight) hours as needed for mild pain.     lidocaine (LIDODERM) 5 % Place 1 patch onto the skin daily as needed (pain). Remove & Discard patch within 12 hours or as directed by MD     oxyCODONE (ROXICODONE) 15 MG immediate release tablet Take 15 mg by mouth every 4 (four) hours as needed.     oxyCODONE-acetaminophen (PERCOCET) 10-325 MG tablet Take 1-2 tablets by mouth every 4 (four) hours as needed for pain. (Patient taking differently: Take 1 tablet by mouth 2 (two) times daily as needed for pain.) 60 tablet 0   SPIRIVA HANDIHALER 18 MCG inhalation capsule Place 1 capsule (18 mcg total) into inhaler and inhale daily. 30 capsule 3   Tiotropium Bromide Monohydrate (SPIRIVA RESPIMAT) 2.5 MCG/ACT AERS Inhale 2 puffs into the lungs daily. 2 each 0   No facility-administered medications prior to visit.    Review of Systems  Constitutional:  Negative for chills, diaphoresis, fever, malaise/fatigue and weight loss.  HENT:  Negative for congestion.   Respiratory:  Positive  for cough, sputum production and shortness of breath. Negative for hemoptysis and wheezing.   Cardiovascular:  Negative for chest pain, palpitations and leg swelling.  Objective:   Vitals:   04/25/21 1612  BP: 118/62  Pulse: 77  Temp: 97.7 F (36.5 C)  TempSrc: Oral  SpO2: 92%  Weight: 96 lb 12.8 oz (43.9 kg)  Height: 5\' 5"  (1.651 m)     Physical Exam: General: Well-appearing, no acute distress HENT: [place], AT Eyes: EOMI, no scleral icterus Respiratory: Clear to auscultation bilaterally.  No crackles, wheezing or rales Cardiovascular: RRR, -M/R/G, no JVD Extremities:-Edema,-tenderness Neuro: AAO x4, CNII-XII grossly intact Psych: Normal mood, normal  affect  Data Reviewed:  Imaging: CT 03/23/19 - Mild bronchiectasis with interval development of tree-in-bud opacities in the mid-lower left lung. Mild emphysema. Stable sub-centimeter lung nodules including in RUL  CT Chest 06/20/19 - Slightly improved tree-in-bud opacities in LLL. Some mucous impaction in LLL. Findings may suggestive of aspiration. Mild centrilobular emphysema  CT Lung screen 04/06/20 - Multiple small pulmonary nodules with largest measured 5.65mm. Emphysema.  CT Lung screen 11/07/20 - Moderate centrilobular emphysema. Mucous/debris in right lower lobe. Scattered bilateral pulmonary nodules with improved nodularity of LLL  PFT: 02/07/14 FVC 1.9 (61%) FEV1 1.4 (58%) Ratio 74  Interpretation: No evidence of obstruction. Reduced FEV1 and FVC may suggest poor effort or restrictive defect  Micro: Sputum 04/27/19 - Pseudomonas aeruginosa, streptococcus Fungal 04/27/19- Aspergillus [employer] 04/27/19 - Neg  Assessment & Plan:   Discussion: 70 year old female active smoker with emphysema who presents for follow-up. Compliant on LAMA however not controlled. Did not tolerate Trelegy due to mouth blisters. She also remains an active smoker which is contributing to her symptoms. Counseled on clinical course and management of COPD with bronchodilators and smoking cessation. We discussed stepping up to LABA/LAMA however she declines due to cost.  Emphysema - uncontrolled Mild bronchiectasis --REFILL Spiriva 18 mcg ONE inhalation daily --CONTINUE Albuterol as needed for shortness of breath or wheezing --Encourage regular aerobic activity including daily walking --Declined Pulmonary rehab opting to increase activity at home first  Tobacco abuse Patient is an active smoker. We discussed smoking cessation for 5 minutes. We discussed triggers and stressors and ways to deal with them. We discussed barriers to continued smoking and benefits of smoking cessation. Provided patient with  information cessation techniques and interventions including [place] quitline.   Health Maintenance Immunization History  Administered Date(s) Administered   Fluad Quad(high Dose 65+) 04/25/2019   Influenza Split 05/28/2012, 08/28/2014   Influenza Whole 05/14/2009, 04/07/2011   Moderna Sars-Covid-2 Vaccination 09/02/2019, 09/30/2019, 07/19/2020   Pneumococcal Conjugate-13 02/19/2015   Pneumococcal Polysaccharide-23 05/28/2012   CT Lung Screen - Due 03/2021  No orders of the defined types were placed in this encounter.  Meds ordered this encounter  Medications   DISCONTD: SPIRIVA HANDIHALER 18 MCG inhalation capsule    Sig: Place 1 capsule (18 mcg total) into inhaler and inhale daily.    Dispense:  30 capsule    Refill:  3   SPIRIVA HANDIHALER 18 MCG inhalation capsule    Sig: Place 1 capsule (18 mcg total) into inhaler and inhale daily.    Dispense:  30 capsule    Refill:  3   Tiotropium Bromide Monohydrate (SPIRIVA RESPIMAT) 2.5 MCG/ACT AERS    Sig: Inhale 1 puff into the lungs daily.    Dispense:  4 g    Refill:  0   Return in about 3 months (around 07/25/2021).  I have spent a total time of 32-minutes on the day of the appointment reviewing prior documentation, coordinating care and discussing medical diagnosis and plan with the patient/family. Past medical history, allergies, medications were reviewed. Pertinent imaging, labs and tests included in this note have been reviewed and interpreted independently by me.  [place], MD [facility] 04/25/2021 12:06 PM  Office Number [phone]

## 2021-04-26 DIAGNOSIS — I10 Essential (primary) hypertension: Secondary | ICD-10-CM | POA: Diagnosis not present

## 2021-04-26 DIAGNOSIS — E782 Mixed hyperlipidemia: Secondary | ICD-10-CM | POA: Diagnosis not present

## 2021-05-15 DIAGNOSIS — J449 Chronic obstructive pulmonary disease, unspecified: Secondary | ICD-10-CM | POA: Diagnosis not present

## 2021-05-15 DIAGNOSIS — M159 Polyosteoarthritis, unspecified: Secondary | ICD-10-CM | POA: Diagnosis not present

## 2021-05-15 DIAGNOSIS — E782 Mixed hyperlipidemia: Secondary | ICD-10-CM | POA: Diagnosis not present

## 2021-05-15 DIAGNOSIS — G894 Chronic pain syndrome: Secondary | ICD-10-CM | POA: Diagnosis not present

## 2021-05-15 DIAGNOSIS — I10 Essential (primary) hypertension: Secondary | ICD-10-CM | POA: Diagnosis not present

## 2021-05-15 DIAGNOSIS — Z681 Body mass index (BMI) 19 or less, adult: Secondary | ICD-10-CM | POA: Diagnosis not present

## 2021-05-17 ENCOUNTER — Encounter: Payer: Self-pay | Admitting: Acute Care

## 2021-06-17 DIAGNOSIS — G894 Chronic pain syndrome: Secondary | ICD-10-CM | POA: Diagnosis not present

## 2021-06-17 DIAGNOSIS — M159 Polyosteoarthritis, unspecified: Secondary | ICD-10-CM | POA: Diagnosis not present

## 2021-06-17 DIAGNOSIS — Z681 Body mass index (BMI) 19 or less, adult: Secondary | ICD-10-CM | POA: Diagnosis not present

## 2021-06-17 DIAGNOSIS — J449 Chronic obstructive pulmonary disease, unspecified: Secondary | ICD-10-CM | POA: Diagnosis not present

## 2021-06-26 DIAGNOSIS — I10 Essential (primary) hypertension: Secondary | ICD-10-CM | POA: Diagnosis not present

## 2021-06-26 DIAGNOSIS — E782 Mixed hyperlipidemia: Secondary | ICD-10-CM | POA: Diagnosis not present

## 2021-06-28 ENCOUNTER — Other Ambulatory Visit: Payer: Self-pay

## 2021-06-28 ENCOUNTER — Ambulatory Visit (HOSPITAL_COMMUNITY)
Admission: RE | Admit: 2021-06-28 | Discharge: 2021-06-28 | Disposition: A | Discharge status: Home / Self Care | Payer: Medicare Other | Source: Ambulatory Visit | Attending: Acute Care | Admitting: Acute Care

## 2021-06-28 DIAGNOSIS — R911 Solitary pulmonary nodule: Secondary | ICD-10-CM | POA: Diagnosis not present

## 2021-06-28 DIAGNOSIS — J9811 Atelectasis: Secondary | ICD-10-CM | POA: Diagnosis not present

## 2021-06-28 DIAGNOSIS — Z87891 Personal history of nicotine dependence: Secondary | ICD-10-CM | POA: Insufficient documentation

## 2021-06-28 DIAGNOSIS — F17210 Nicotine dependence, cigarettes, uncomplicated: Secondary | ICD-10-CM | POA: Diagnosis not present

## 2021-06-28 DIAGNOSIS — I700 Atherosclerosis of aorta: Secondary | ICD-10-CM | POA: Diagnosis not present

## 2021-06-28 DIAGNOSIS — J439 Emphysema, unspecified: Secondary | ICD-10-CM | POA: Diagnosis not present

## 2021-07-01 ENCOUNTER — Telehealth: Payer: Self-pay | Admitting: Acute Care

## 2021-07-01 NOTE — Telephone Encounter (Signed)
Spoke with Malachy Mood with [facility]   Call report on LDCT 06/28/21-   IMPRESSION: 1. Lung-RADS 4Bs, suspicious. Additional imaging evaluation or consultation with Pulmonology or Thoracic Surgery recommended. 2. The S modifier above refers to the new atelectasis and consolidation with surrounding ground-glass attenuation involving the right middle lobe. Although the appearance may be the sequelae of inflammation, infection, aspiration and/or mucous plugging underlying endobronchial lesion would be difficult to exclude. 3. Aortic Atherosclerosis (ICD10-I70.0) and Emphysema (ICD10-J43.9). 4. These results will be called to the ordering clinician or representative by the Radiologist Assistant, and communication documented in the PACS or [employer].     Electronically Signed   By: Kerby Moors M.D.   On: 07/01/2021 08:49   Routing to Judson Roch

## 2021-07-02 ENCOUNTER — Other Ambulatory Visit: Payer: Self-pay

## 2021-07-02 DIAGNOSIS — F17210 Nicotine dependence, cigarettes, uncomplicated: Secondary | ICD-10-CM

## 2021-07-02 DIAGNOSIS — Z87891 Personal history of nicotine dependence: Secondary | ICD-10-CM

## 2021-07-02 DIAGNOSIS — R911 Solitary pulmonary nodule: Secondary | ICD-10-CM

## 2021-07-02 NOTE — Telephone Encounter (Signed)
Spoke with the pt and offered video visit tomorrow at 3 pm  She preferred to come in since not comfortable with video visit  I scheduled her in office for 3 pm tomorrow

## 2021-07-02 NOTE — Telephone Encounter (Signed)
I have called the patient with the results of her low dose CT Chest. Lung RADS 4 A : suspicious findings, either short term follow up in 3 months or alternatively  PET Scan evaluation may be considered when there is a solid component of  8 mm or larger.  Radiology feels this is more representative of infection. Plan is for a 3 month follow up after treatment . We will need to consider a swallow eval as there is a ? Of aspiration vs mucus plugging. She states she does not get choked on food when she eats of drinks.  Triage, please set patient up in my 3 pm open visit slot  for 12/7 at 3 pm for a video visit. Marland Kitchen Please call her and confirm that she does have the visit scheduled.  This is for follow up LDCT results She has a follow up appointment with Dr. Florina Ou scheduled for 12/14, which she will need to keep to assess for improvement. Denise, 3 month follow up CT Chest and fax results to PCP. Let them know plan is for a follow up scan once infection has been treated.  Thanks so much

## 2021-07-02 NOTE — Telephone Encounter (Signed)
3 month follow up chest CT order placed and results/recommendations faxed to PCP as instructed by  Eric Form NP

## 2021-07-03 ENCOUNTER — Telehealth: Payer: Self-pay | Admitting: Acute Care

## 2021-07-03 ENCOUNTER — Encounter: Payer: Self-pay | Admitting: Acute Care

## 2021-07-03 ENCOUNTER — Ambulatory Visit: Payer: Medicare Other | Admitting: Acute Care

## 2021-07-03 ENCOUNTER — Other Ambulatory Visit: Payer: Self-pay

## 2021-07-03 VITALS — BP 104/58 | HR 70 | Temp 98.4°F | Ht 65.0 in | Wt 98.0 lb

## 2021-07-03 DIAGNOSIS — F17210 Nicotine dependence, cigarettes, uncomplicated: Secondary | ICD-10-CM

## 2021-07-03 DIAGNOSIS — R059 Cough, unspecified: Secondary | ICD-10-CM

## 2021-07-03 DIAGNOSIS — J449 Chronic obstructive pulmonary disease, unspecified: Secondary | ICD-10-CM

## 2021-07-03 DIAGNOSIS — R634 Abnormal weight loss: Secondary | ICD-10-CM

## 2021-07-03 DIAGNOSIS — Z720 Tobacco use: Secondary | ICD-10-CM

## 2021-07-03 DIAGNOSIS — R918 Other nonspecific abnormal finding of lung field: Secondary | ICD-10-CM | POA: Diagnosis not present

## 2021-07-03 NOTE — Progress Notes (Signed)
History of Present Illness Brandi Bean is a 70 year old female current every day smoker  smoker with emphysema and bronchiectasis  who presents for follow-up after abnormal Low Dose CT Chest. She is followed by Dr. Loanne Drilling in the pulmonary clinic.   07/03/2021 Follow up for abnormal LDCT Initial abnormal low dose CT Chest was 10/2020. LR 3  with 6 month follow up recommended. Radiology felt patient's scan was more reflective of mucoid impaction vs chronic  aspiration, less likely atypical infection. She states she has no trouble eating, and does not choke on her food. Plan was to repeat low dose CT in 3 months. We treated her with Doxycycline  2 weeks prior to her follow up scan . She had a reaction to this and was started on a z pack.   Repeat LDCT 05/2021 read as a Lung RADS 4 B  shows new atelectasis and consolidation with surrounding ground-glass attenuation involving the right middle lobe. Although the appearance may be the sequelae of inflammation, infection, aspiration and/or mucous plugging underlying endobronchial lesion would be difficult to exclude. Pt. states she has not noticed any acute changes in her baseline. She does continue to smoke about half a pack a day. She is working on quitting. She states she has minimal cough, mostly productive first thing in the morning, but in general she does not cough much up. She states secretions are white to yellow. She denies wheezing but I can hear wheezing on exam. She has a very congested cough in the office today, so I am unsure how good a historian she is.   Of note, she has had weight loss over the last 2 years of about 50 pounds. She states she has no appetite.We may need to consider bronch.    Pt. states she has not noticed any acute changes in her baseline. She does continue to smoke about half a pack a day. She is working on quitting. She states she has minimal cough, mostly productive first thing in the morning, but in general she  does not cough much up. She has a very congested cough in the office today, so I am unsure how good a historian she is. She states she is compliant with her Spiriva, yet declines Trelegy or Breztri dur to cost.   Of note, she has had weight loss over the last 2 years of about 50 pounds. She states she has no appetite.We may need to consider bronch/ swallow eval.   Test Results: 06/28/2021 LDCT Lung-RADS 4Bs, suspicious. Additional imaging evaluation or consultation with Pulmonology or Thoracic Surgery recommended. 2. The S modifier above refers to the new atelectasis and consolidation with surrounding ground-glass attenuation involving the right middle lobe. Although the appearance may be the sequelae of inflammation, infection, aspiration and/or mucous plugging underlying endobronchial lesion would be difficult to exclude. 3. Aortic Atherosclerosis (ICD10-I70.0) and Emphysema (ICD10-J43.9). 4. These results will be called to the ordering clinician or representative by the Radiologist Assistant, and communication documented in the PACS or [employer].   11/07/2020 LDCT Chest Lung-RADS 3, probably benign findings. Short-term follow-up in 6 months is recommended with repeat low-dose chest CT without contrast (please use the following order, "CT CHEST LCS NODULE FOLLOW-UP W/O CM"). Although the left lower lobe nodularity of interest is slightly improved, there is a new more lateral left lower lobe nodule or area of mucoid impaction. Given concurrent endobronchial fluid/secretions within the right lower lobe today and within the left lower lobe on  the prior exam, findings are favored to be related to chronic aspiration or less likely atypical infection. Consider antibiotic therapy prior to CT follow-up. 2. Aortic atherosclerosis (ICD10-I70.0), coronary artery atherosclerosis and emphysema (ICD10-J43.9). 3. Left adrenal adenoma.   PFT: 02/07/14 FVC 1.9 (61%) FEV1 1.4 (58%) Ratio  74  Interpretation: No evidence of obstruction. Reduced FEV1 and FVC may suggest poor effort or restrictive defect   Micro: Sputum 04/27/19 - Pseudomonas aeruginosa, streptococcus Fungal 04/27/19- Aspergillus [place] [employer] 04/27/19 - Neg   CBC Latest Ref Rng & Units 02/18/2017 07/30/2016 05/18/2007  WBC 4.0 - 10.5 K/uL 11.9(H) 10.1 9.3  Hemoglobin 12.0 - 15.0 g/dL 17.7(H) 17.1(H) 15.3(H)  Hematocrit 36.0 - 46.0 % 51.6(H) 49.3(H) 43.6  Platelets 150 - 400 K/uL 246 209 242    BMP Latest Ref Rng & Units 02/18/2017 07/30/2016 11/01/2007  Glucose 65 - 99 mg/dL 182(H) 99 101(H)  BUN 6 - 20 mg/dL 12 10 9   Creatinine 0.44 - 1.00 mg/dL 1.07(H) 0.95 1.0  Sodium 135 - 145 mmol/L 135 141 146(H)  Potassium 3.5 - 5.1 mmol/L 3.3(L) 4.1 3.7  Chloride 101 - 111 mmol/L 97(L) 105 108  CO2 22 - 32 mmol/L 27 30 32  Calcium 8.9 - 10.3 mg/dL 9.4 9.8 9.4    BNP No results found for: BNP  ProBNP No results found for: PROBNP  PFT No results found for: FEV1PRE, FEV1POST, FVCPRE, FVCPOST, TLC, DLCOUNC, PREFEV1FVCRT, PSTFEV1FVCRT  CT CHEST LCS NODULE F/U W/O CONTRAST  Result Date: 07/01/2021 CLINICAL DATA:  Lung cancer screening. Forty-three pack-year history. Current asymptomatic smoker. EXAM: CT CHEST WITHOUT CONTRAST FOR LUNG CANCER SCREENING NODULE FOLLOW-UP TECHNIQUE: Multidetector CT imaging of the chest was performed following the standard protocol without IV contrast. COMPARISON:  11/07/2020 FINDINGS: Cardiovascular: Heart size is normal. Aortic atherosclerosis and coronary artery calcifications. No pericardial effusion. Mediastinum/Nodes: No enlarged mediastinal, hilar, or axillary lymph nodes. Thyroid gland, trachea, and esophagus demonstrate no significant findings. Lungs/Pleura: Mild centrilobular emphysema. No pleural effusion. There is new atelectasis and consolidation involving at least 50% of the right middle lobe with ground-glass attenuation within the remaining aerated portions of the right middle lobe.  Filling defects identified within the right middle lobe airways identified which may reflect aspirated material and or mucous plugging. Underlying endobronchial lesion cannot be excluded. Imaging findings compatible with Lung-RADS 4B. Persistent subsegmental atelectasis and consolidation with diffuse tree-in-bud nodularity within the posteromedial left lower lobe is identified compatible with sequelae of chronic inflammation or infection. Recurrent aspiration not excluded. New subpleural nodular density within the periphery of the left lower lobe is also noted with a mean derived diameter of 10.8 mm, also consistent with Lung-RADS 4B. The remaining, previously noted lung nodules are stable in the interval. Upper Abdomen: No acute abnormality within the imaged portions of the upper abdomen. Musculoskeletal: No chest wall mass or suspicious bone lesions identified. Status post ACDF. Remote right rib fractures. IMPRESSION: 1. Lung-RADS 4Bs, suspicious. Additional imaging evaluation or consultation with Pulmonology or Thoracic Surgery recommended. 2. The S modifier above refers to the new atelectasis and consolidation with surrounding ground-glass attenuation involving the right middle lobe. Although the appearance may be the sequelae of inflammation, infection, aspiration and/or mucous plugging underlying endobronchial lesion would be difficult to exclude. 3. Aortic Atherosclerosis (ICD10-I70.0) and Emphysema (ICD10-J43.9). 4. These results will be called to the ordering clinician or representative by the Radiologist Assistant, and communication documented in the PACS or [employer]. Electronically Signed   By: Queen Slough.D.  On: 07/01/2021 08:49     Past medical hx Past Medical History:  Diagnosis Date   Anxiety    Arthritis    Back pain    COPD (chronic obstructive pulmonary disease) (HCC)    Depression    Dyspnea    W/ EXERTION+   WHEEZING   Fibromyalgia    Headache    Hypertension     PONV (postoperative nausea and vomiting)    Sleep apnea    ?   MILD NO MACHINE ORDERED, TO BE RETESTED AT HOME AFTER SURGERY   Stroke [facility])    MINI STROKE   15-20 YRS AGO   UTI (urinary tract infection)      Social History   Tobacco Use   Smoking status: Every Day    Packs/day: 1.00    Years: 30.00    Pack years: 30.00    Types: Cigarettes    Start date: 1992   Smokeless tobacco: Never   Tobacco comments:    A little over a half pack a day-07/03/2021 hfb  Vaping Use   Vaping Use: Some days  Substance Use Topics   Alcohol use: No    Alcohol/week: 0.0 standard drinks   Drug use: No    BrandiChildress reports that she has been smoking cigarettes. She started smoking about 30 years ago. She has a 30.00 pack-year smoking history. She has never used smokeless tobacco. She reports that she does not drink alcohol and does not use drugs.  Tobacco Cessation: Current Every Day smoker  Smoking more than 0.5 PPD  Past surgical hx, Family hx, Social hx all reviewed.  Current Outpatient Medications on File Prior to Visit  Medication Sig   albuterol (PROVENTIL HFA;VENTOLIN HFA) 108 (90 BASE) MCG/ACT inhaler Inhale 2 puffs into the lungs every 6 (six) hours as needed for wheezing or shortness of breath.   atorvastatin (LIPITOR) 20 MG tablet Take 20 mg by mouth daily at 6 PM.   bisoprolol-hydrochlorothiazide (ZIAC) 10-6.25 MG per tablet Take 1 tablet by mouth every evening.   guaifenesin (ROBITUSSIN) 100 MG/5ML syrup Take 200 mg by mouth at bedtime as needed for cough.   oxyCODONE (ROXICODONE) 15 MG immediate release tablet Take 15 mg by mouth every 4 (four) hours as needed.   oxyCODONE-acetaminophen (PERCOCET) 10-325 MG tablet Take 1-2 tablets by mouth every 4 (four) hours as needed for pain. (Patient taking differently: Take 1 tablet by mouth 2 (two) times daily as needed for pain.)   SPIRIVA HANDIHALER 18 MCG inhalation capsule Place 1 capsule (18 mcg total) into inhaler and inhale daily.    Tiotropium Bromide Monohydrate (SPIRIVA RESPIMAT) 2.5 MCG/ACT AERS Inhale 2 puffs into the lungs daily.   azithromycin (ZITHROMAX) 250 MG tablet Take 2 tablets first day then 1 tablet daily until finished. (Patient not taking: Reported on 07/03/2021)   Tiotropium Bromide Monohydrate (SPIRIVA RESPIMAT) 2.5 MCG/ACT AERS Inhale 1 puff into the lungs daily. (Patient not taking: Reported on 07/03/2021)   No current facility-administered medications on file prior to visit.     Allergies  Allergen Reactions   Doxycycline Other (See Comments)    Patient got burning and itching above eyes   Codeine Sulfate Itching   Hydrocodone Other (See Comments)    Passes out    Latex Itching   Prednisone Other (See Comments)    Unknown      Review Of Systems:  Constitutional:   No  weight loss, night sweats,  Fevers, chills, + fatigue, or  lassitude.  HEENT:   No headaches,  Difficulty swallowing,  Tooth/dental problems, or  Sore throat,                No sneezing, itching, ear ache, nasal congestion, post nasal drip,   CV:  No chest pain,  Orthopnea, PND, swelling in lower extremities, anasarca, dizziness, palpitations, syncope.   GI  No heartburn, indigestion, abdominal pain, nausea, vomiting, diarrhea, change in bowel habits, loss of appetite, bloody stools.   Resp: No shortness of breath with exertion or at rest.  + baseline  excess mucus,+ Baseline  productive cough in the morning ,  + baseline  non-productive cough,  No coughing up of blood.  No  change in color of mucus.  + wheezing.  No chest wall deformity  Skin: no rash or lesions.  GU: no dysuria, change in color of urine, no urgency or frequency.  No flank pain, no hematuria   MS:  No joint pain or swelling.  No decreased range of motion.  No back pain.  Psych:  No change in mood or affect. No depression or anxiety.  No memory loss.   Vital Signs BP (!) 104/58 (BP Location: Right Arm, Patient Position: Sitting, Cuff Size: Normal)    Pulse 70   Temp 98.4 F (36.9 C) (Oral)   Ht 5\' 5"  (1.651 m)   Wt 98 lb (44.5 kg)   SpO2 97%   BMI 16.31 kg/m    Physical Exam:  General- No distress,  A&Ox3 ENT: No sinus tenderness, TM clear, pale nasal mucosa, no oral exudate,no post nasal drip, no LAN Cardiac: S1, S2, regular rate and rhythm, no murmur Chest: + Bilateral expiratory wheeze/ No rales/ dullness; no accessory muscle use, no nasal flaring, no sternal retractions, diminished per bases with long expiratory phase Abd.: Soft Non-tender, ND, BS +, Body mass index is 16.31 kg/m. Ext: No clubbing cyanosis, edema Neuro:  normal strength, MAE x 4, A&O x 3 Skin: No rashes, warm and dry, No lesions Psych: normal mood and behavior   Assessment/Plan Abnormal low Dose CT with new atelectasis and consolidation with surrounding ground-glass attenuation involving the right middle lobe.  Appearance may be the sequelae of inflammation, infection, aspiration and/or mucous plugging underlying endobronchial lesion  difficult to exclude. Plan Spiriva 18 mcg one  inhalation daily Rinse mouth after use Albuterol as needed for shortness of breath or wheezing We will do a sputum collection. Collect and take to [facility] within 4 hours of collection .  We will call you with the results If we get a positive culture back, we will treat with antibiotic specific to that bacteria. We will start Mucinex 1200 mg once daily  This will help to thin secretions  We will get you a flutter valve. Blow into this 10 times , three times a day. If you get dizzy or lightheaded, take a break, and then try again. We will repeat your CT Chest LDCT in 8 weeks.  If needed we will schedule a swallow study to ensure you are not silently aspirating We may need to consider a bronchoscopy. Follow up with Dr. Loanne Drilling on 07/10/2021, hopefully gram stains will have resulted, and we can consider ABX tx can be considered Please work on quitting smoking  . Every day you smoke less is a good day.  Please contact office for sooner follow up if symptoms do not improve or worsen or seek emergency care  .    Tobacco Abuse Plan Please work  on quitting smoking entirely This is the single most powerful action you can take to decrease your risk of lung cancer, heart disease, stroke and worsening pulmonary disease  Nutrition/ weight loss of 50+ pounds in 2 years No appetite Tried on Megace, but non-compliant Plan Please consider adding Ensure or Boost to you daily routine as a snack, not a meal replacement Do not skip meals  ? Silent Aspiration Plan Consider Swallow evaluation  I spent 45 minutes dedicated to the care of this patient on the date of this encounter to include pre-visit review of records, face-to-face time with the patient discussing conditions above, post visit ordering of testing, clinical documentation with the electronic health record, making appropriate referrals as documented, and communicating necessary information to the patient's healthcare team.   Magdalen Spatz, NP 07/03/2021  9:20 PM

## 2021-07-03 NOTE — Patient Instructions (Addendum)
It is good to see you today. We will do a sputum collection. Collect and take to [facility] within 4 hours of collection .  We will call you with the results If we get a positive culture back, we will treat with antibiotic specific to that bacteria. We will start Mucinex 1200 mg once daily  This will help to thin secretions  We will get you a flutter valve. Blow into this 10 times , three times a day. If you get dizzy or lightheaded, take a break, and then try again. We will repeat your CT Chest LDCT in 8 weeks.  If needed we will schedule a swallow study to ensure you are not silently aspirating We may need to consider a bronchoscopy. Follow up with Dr. Loanne Drilling on 07/10/2021 Please work on quitting smoking . Every day you smoke less is a good day.  Please contact office for sooner follow up if symptoms do not improve or worsen or seek emergency care  .

## 2021-07-03 NOTE — Telephone Encounter (Signed)
Brandi Bean, I saw Brandi Bean today. She has had 2 abnormal low-dose screening CTs however both appear to have mucoid impaction with questionable inflammatory versus infectious findings . April 2022 her low-dose CT was read as a lung RADS 3, we treated her with antibiotic and repeated the scan in 6 months as is indicated and repeat CT came back as a lung RADS 4 but again showing mucoid impaction atelectasis inflammation with inability to rule out bronchogenic carcinoma. I have looked back at previous CTs and have seen that there is some tree-in-bud notations question if this could be bronchiectasis versus silent aspiration versus chronic infection versus carcinoma.  She has lost about 52 pounds over the last 2 years very slowly which could be explained by the fact she has no appetite.  However highly concerning as we cannot get a better look at her lungs due to her infectious inflammatory picture kind of muddying the waters. Previous cultures have shown Pseudomonas.  My plan is to get fresh cultures hopefully tomorrow morning and then you will see her in a week with the hope that there is at least a Gram stain that might help guide antibiotic treatment, for repeat Cipro if that is what we feel is most appropriate. My plan right now is for an 8-week follow-up low-dose CT after really aggressive treatment with a flutter valve and Mucinex and hopefully the right antibiotic. I am concerned she may inevitably need a bronc or a swallow eval in the near future. Let me know your thoughts, thanks so much

## 2021-07-04 ENCOUNTER — Telehealth: Payer: Self-pay | Admitting: Acute Care

## 2021-07-04 DIAGNOSIS — R059 Cough, unspecified: Secondary | ICD-10-CM

## 2021-07-04 NOTE — Telephone Encounter (Signed)
Brandi Bean called back and informed me she is able to see the sputum culture and there are no issues. She is aware to call back if issues arise while pt is there to drop of collection. Nothing further needed at this time.

## 2021-07-04 NOTE — Telephone Encounter (Addendum)
Called and spoke to pt's daughter, Hilda Blades. She states the pt took her sputum collection to [facility] today but they couldn't accept it because there wasn't an order. Per pt's chart there was not an order placed. New order placed for bacterial sputum culture. Pt aware to go back tomorrow morning to give new collection in a new cup.   Called [facility] to assure they can see the lab. Spoke with tech and was advised they couldn't see it yet but couldve been because I just placed the order. She states she will check back shortly and call back if they cannot see the order.

## 2021-07-05 ENCOUNTER — Other Ambulatory Visit (HOSPITAL_COMMUNITY)
Admission: RE | Admit: 2021-07-05 | Discharge: 2021-07-05 | Disposition: A | Discharge status: Home / Self Care | Payer: Medicare Other | Source: Ambulatory Visit | Attending: Acute Care | Admitting: Acute Care

## 2021-07-05 ENCOUNTER — Other Ambulatory Visit: Payer: Self-pay

## 2021-07-05 DIAGNOSIS — R059 Cough, unspecified: Secondary | ICD-10-CM | POA: Insufficient documentation

## 2021-07-05 LAB — EXPECTORATED SPUTUM ASSESSMENT W GRAM STAIN, RFLX TO RESP C

## 2021-07-07 LAB — CULTURE, RESPIRATORY W GRAM STAIN: Culture: NORMAL

## 2021-07-10 ENCOUNTER — Ambulatory Visit: Payer: Medicare Other | Admitting: Pulmonary Disease

## 2021-07-10 ENCOUNTER — Encounter: Payer: Self-pay | Admitting: Pulmonary Disease

## 2021-07-10 ENCOUNTER — Other Ambulatory Visit: Payer: Self-pay

## 2021-07-10 VITALS — BP 110/60 | HR 74 | Temp 97.9°F | Ht 65.0 in | Wt 95.8 lb

## 2021-07-10 DIAGNOSIS — R918 Other nonspecific abnormal finding of lung field: Secondary | ICD-10-CM

## 2021-07-10 DIAGNOSIS — Z720 Tobacco use: Secondary | ICD-10-CM

## 2021-07-10 DIAGNOSIS — J449 Chronic obstructive pulmonary disease, unspecified: Secondary | ICD-10-CM | POA: Diagnosis not present

## 2021-07-10 NOTE — Patient Instructions (Addendum)
RML atelectasis. Concern endobronchial lesions vs debris Chronic inflammatory/infectious changes --Discussed bronchoscopy for further evaluation. After shared decision making, will obtain CT imaging first considering bronchoscopy. --CONTINUE Mucinex --START n-acetylcysteine up to 1200 mg daily. Purchase over-the counter --CT Chest without contrast on 07/19/21 at [facility] --If no changes on CT, will plan for bronchoscopy with anesthesia --Consider barium swallow in the future    Here is a video to help with phlegm management for when your are having symptoms. The link is as follows: https://youtu.be/[email or web address] . If the link does not work, please search [facility] [place] [facility] on YouTube.

## 2021-07-10 NOTE — Progress Notes (Signed)
Subjective:   PATIENT ID: Brandi Bean GENDER: female DOB: 17-Aug-1950, MRN: [id number]   HPI  Chief Complaint  Patient presents with   Follow-up    Patient feels good and that her breathing is good. No concerns. Wants to talk about pneumonia shot.   Reason for Visit: Follow-up  Brandi Bean is a 70 year old female active smoker with emphysema who presents for follow-up.  04/25/21 Since our last visit she was treated with antibiotics for abnormality seen on LDCT. She continues to be compliant with Spiriva. She uses albuterol once a week. Denies shortness of breath but sits on the couch. She will have a productive cough in the mornings. She still actively smokes 3/4 ppd. Daughter encourages her to quit smoking. Last exacerbation in 03/2020.  07/10/21 She was recently seen by NP Groce for abnormal LDCT. Recent CT on 06/28/21 with interval development of RML atelectasis with possible debris/mucoid impaction however endobronchial lesion cannot be rule out. Improved RLL mucoid impaction. Similar LLL sequelae of chronic inflammation/infection. With her history of prior Pseudomonas sputum was collected however today respiratory culture returns with normal flora.  Since our last visit she continues on Spiriva daily. She has baseline mild chronic cough with sputum in the morning associated with chest congestion. She has been Christmas shopping and able to keep up with her daughter. Previously a few weeks ago with wheezing but mucinex has helped. She has also decreased smoking to 1/2 ppd. Consider using the patch and lozenges. Denies recent unexplained fevers, chills, weight loss or night sweats.  Social History: Active smoker She cares for multiple family members including a younger brother with intellectual disability  Past Medical History:  Diagnosis Date   Anxiety    Arthritis    Back pain    COPD (chronic obstructive pulmonary disease) (HCC)    Depression    Dyspnea    W/  EXERTION+   WHEEZING   Fibromyalgia    Headache    Hypertension    PONV (postoperative nausea and vomiting)    Sleep apnea    ?   MILD NO MACHINE ORDERED, TO BE RETESTED AT HOME AFTER SURGERY   Stroke [facility])    MINI STROKE   15-20 YRS AGO   UTI (urinary tract infection)     Outpatient Medications Prior to Visit  Medication Sig Dispense Refill   albuterol (PROVENTIL HFA;VENTOLIN HFA) 108 (90 BASE) MCG/ACT inhaler Inhale 2 puffs into the lungs every 6 (six) hours as needed for wheezing or shortness of breath.     atorvastatin (LIPITOR) 20 MG tablet Take 20 mg by mouth daily at 6 PM.     azithromycin (ZITHROMAX) 250 MG tablet Take 2 tablets first day then 1 tablet daily until finished. (Patient not taking: Reported on 07/03/2021) 6 tablet 0   bisoprolol-hydrochlorothiazide (ZIAC) 10-6.25 MG per tablet Take 1 tablet by mouth every evening.     guaifenesin (ROBITUSSIN) 100 MG/5ML syrup Take 200 mg by mouth at bedtime as needed for cough.     oxyCODONE (ROXICODONE) 15 MG immediate release tablet Take 15 mg by mouth every 4 (four) hours as needed.     oxyCODONE-acetaminophen (PERCOCET) 10-325 MG tablet Take 1-2 tablets by mouth every 4 (four) hours as needed for pain. (Patient taking differently: Take 1 tablet by mouth 2 (two) times daily as needed for pain.) 60 tablet 0   SPIRIVA HANDIHALER 18 MCG inhalation capsule Place 1 capsule (18 mcg total) into inhaler and inhale daily. [place]  capsule 3   Tiotropium Bromide Monohydrate (SPIRIVA RESPIMAT) 2.5 MCG/ACT AERS Inhale 2 puffs into the lungs daily. 2 each 0   Tiotropium Bromide Monohydrate (SPIRIVA RESPIMAT) 2.5 MCG/ACT AERS Inhale 1 puff into the lungs daily. (Patient not taking: Reported on 07/03/2021) 4 g 0   No facility-administered medications prior to visit.    Review of Systems  Constitutional:  Negative for chills, diaphoresis, fever, malaise/fatigue and weight loss.  HENT:  Positive for congestion.   Respiratory:  Positive  for cough and sputum production. Negative for hemoptysis, shortness of breath and wheezing.   Cardiovascular:  Negative for chest pain, palpitations and leg swelling.  Objective:   Vitals:   07/10/21 0928  BP: 110/60  Pulse: 74  Temp: 97.9 F (36.6 C)  TempSrc: Oral  SpO2: 93%  Weight: 43.5 kg  Height: 5\' 5"  (1.651 m)     Physical Exam: General: Well-appearing, no acute distress HENT: [place], AT Eyes: EOMI, no scleral icterus Respiratory: Clear to auscultation bilaterally.  No crackles, wheezing or rales Cardiovascular: RRR, -M/R/G, no JVD Extremities:-Edema,-tenderness Neuro: AAO x4, CNII-XII grossly intact Psych: Normal mood, normal affect  Data Reviewed:  Imaging: CT 03/23/19 - Mild bronchiectasis with interval development of tree-in-bud opacities in the mid-lower left lung. Mild emphysema. Stable sub-centimeter lung nodules including in RUL  CT Chest 06/20/19 - Slightly improved tree-in-bud opacities in LLL. Some mucous impaction in LLL. Findings may suggestive of aspiration. Mild centrilobular emphysema  CT Lung screen 04/06/20 - Multiple small pulmonary nodules with largest measured 5.63mm. Emphysema.  CT Lung screen 11/07/20 - Moderate centrilobular emphysema. Mucous/debris in right lower lobe. Scattered bilateral pulmonary nodules with improved nodularity of LLL  CT Lung screen 06/28/21  - Interval development of RML atelectasis with possible debris/mucoid impaction however endobronchial lesion cannot be rule out. Improved RLL mucoid impaction. Similar LLL sequelae of chronic inflammation/infection. New subpleural ground glass opacity/nodule ~1 cm in anterior LLL. Background mild emphysema  PFT: 02/07/14 FVC 1.9 (61%) FEV1 1.4 (58%) Ratio 74  Interpretation: No evidence of obstruction. Reduced FEV1 and FVC may suggest poor effort or restrictive defect  Micro: Sputum 04/27/19 - Pseudomonas aeruginosa, streptococcus Fungal 04/27/19- Aspergillus [employer] 04/27/19 -  Neg  Sputum 07/05/21 - Normal flora  Assessment & Plan:   Discussion: 70 year old female active smoker with emphysema who presents for follow-up. Reviewed CT from April and December 2022 with interval development of RML atelectasis, mucoid impaction. Chronic changes in LLL with tree-in-bud, bronchiectasis suggestive of sequelae of chronic infection/inflammation.  RML atelectasis. Concern endobronchial lesions vs debris Chronic inflammatory/infectious changes --Discussed bronchoscopy for further evaluation. After shared decision making including risks and benefits of procedure, will obtain CT imaging first considering bronchoscopy. --CONTINUE Mucinex --START n-acetylcysteine up to 1200 mg --CT Chest without contrast on 07/19/21 at [facility] --If no changes on CT, will plan for bronchoscopy with anesthesia --Consider barium swallow in the future  Emphysema - stable Mild bronchiectasis --CONTINUE Spiriva 18 mcg ONE inhalation daily --CONTINUE Albuterol as needed for shortness of breath or wheezing --Encourage regular aerobic activity including daily walking  Tobacco abuse Patient is an active smoker. We discussed smoking cessation for 3 minutes. We discussed triggers and stressors and ways to deal with them. We discussed barriers to continued smoking and benefits of smoking cessation. Provided patient with information cessation techniques and interventions including [place] quitline.  Health Maintenance Immunization History  Administered Date(s) Administered   Fluad Quad(high Dose 65+) 04/25/2019, 04/15/2021   Influenza Split 05/28/2012, 08/28/2014  Influenza Whole 05/14/2009, 04/07/2011   Moderna Sars-Covid-2 Vaccination 09/02/2019, 09/30/2019, 07/19/2020   Pneumococcal Conjugate-13 02/19/2015   Pneumococcal Polysaccharide-23 05/28/2012   CT Lung Screen - Recommend annually  Orders Placed This Encounter  Procedures   CT Chest Wo Contrast    Standing Status:   Future    Standing  Expiration Date:   07/10/2022    Scheduling Instructions:     NEEDS TO BE DONE ON 07/19/2021    Order Specific Question:   Preferred imaging location?    Answer:   [facility]   No orders of the defined types were placed in this encounter.  No follow-ups on file. Will arrange follow-up/bronchoscopy after 07/19/21  I have spent a total time of 45-minutes on the day of the appointment reviewing prior documentation, coordinating care and discussing medical diagnosis and plan with the patient/family. Past medical history, allergies, medications were reviewed. Pertinent imaging, labs and tests included in this note have been reviewed and interpreted independently by me.   [place], MD [facility] 07/10/2021 7:59 AM  Office Number [phone]

## 2021-07-17 DIAGNOSIS — J449 Chronic obstructive pulmonary disease, unspecified: Secondary | ICD-10-CM | POA: Diagnosis not present

## 2021-07-17 DIAGNOSIS — M159 Polyosteoarthritis, unspecified: Secondary | ICD-10-CM | POA: Diagnosis not present

## 2021-07-17 DIAGNOSIS — I10 Essential (primary) hypertension: Secondary | ICD-10-CM | POA: Diagnosis not present

## 2021-07-17 DIAGNOSIS — G894 Chronic pain syndrome: Secondary | ICD-10-CM | POA: Diagnosis not present

## 2021-07-19 ENCOUNTER — Other Ambulatory Visit: Payer: Self-pay

## 2021-07-19 ENCOUNTER — Ambulatory Visit (HOSPITAL_COMMUNITY)
Admission: RE | Admit: 2021-07-19 | Discharge: 2021-07-19 | Disposition: A | Discharge status: Home / Self Care | Payer: Medicare Other | Source: Ambulatory Visit | Attending: Pulmonary Disease | Admitting: Pulmonary Disease

## 2021-07-19 DIAGNOSIS — J439 Emphysema, unspecified: Secondary | ICD-10-CM | POA: Diagnosis not present

## 2021-07-19 DIAGNOSIS — R918 Other nonspecific abnormal finding of lung field: Secondary | ICD-10-CM | POA: Diagnosis not present

## 2021-07-19 DIAGNOSIS — Z720 Tobacco use: Secondary | ICD-10-CM

## 2021-07-19 DIAGNOSIS — I700 Atherosclerosis of aorta: Secondary | ICD-10-CM | POA: Diagnosis not present

## 2021-07-19 DIAGNOSIS — R911 Solitary pulmonary nodule: Secondary | ICD-10-CM | POA: Diagnosis not present

## 2021-07-26 DIAGNOSIS — E782 Mixed hyperlipidemia: Secondary | ICD-10-CM | POA: Diagnosis not present

## 2021-07-26 DIAGNOSIS — I10 Essential (primary) hypertension: Secondary | ICD-10-CM | POA: Diagnosis not present

## 2021-07-31 ENCOUNTER — Telehealth: Payer: Self-pay | Admitting: Pulmonary Disease

## 2021-07-31 NOTE — Telephone Encounter (Signed)
Spoke to patient, who is requesting CT results.  Dr. Loanne Drilling, please advise. Thanks

## 2021-08-01 NOTE — Telephone Encounter (Signed)
[facility] Telephone Encounter  Called patient to update on CT scan. Resolution of RML atelectasis likely secondary to mucous plugging. However RML remains narrowed. Will plan to re-evaluate with CT chest in 6 months.  Will order at next visit in April 2023. Currently on recall.

## 2021-08-09 ENCOUNTER — Telehealth: Payer: Self-pay | Admitting: Pulmonary Disease

## 2021-08-09 NOTE — Telephone Encounter (Signed)
-----   Message from Drake sent at 08/09/2021  8:10 AM EST ----- Regarding: FW: Schedule follow-up in April  ----- Message ----- From: Harland German Sent: 08/09/2021   7:53 AM EST To: Veronia Beets Profitt Subject: FW: Schedule follow-up in April                 ----- Message ----- From: Margaretha Seeds, MD Sent: 08/09/2021   1:19 AM EST To: Margaretha Seeds, MD, [facility] Subject: Schedule follow-up in April                    Please schedule patient for follow-up in April with me. Thanks, JE

## 2021-08-09 NOTE — Telephone Encounter (Signed)
Spoke with patient who states she would like to wait closer to April to schedule because her insurance is not covering anything at this time.

## 2021-08-14 ENCOUNTER — Telehealth: Payer: Self-pay | Admitting: Pulmonary Disease

## 2021-08-14 NOTE — Telephone Encounter (Signed)
Called patient but she did not answer. I could not leave a VM. Will call back later.

## 2021-08-21 DIAGNOSIS — M159 Polyosteoarthritis, unspecified: Secondary | ICD-10-CM | POA: Diagnosis not present

## 2021-08-21 DIAGNOSIS — G894 Chronic pain syndrome: Secondary | ICD-10-CM | POA: Diagnosis not present

## 2021-08-21 DIAGNOSIS — I10 Essential (primary) hypertension: Secondary | ICD-10-CM | POA: Diagnosis not present

## 2021-08-21 DIAGNOSIS — Z681 Body mass index (BMI) 19 or less, adult: Secondary | ICD-10-CM | POA: Diagnosis not present

## 2021-08-27 DIAGNOSIS — I10 Essential (primary) hypertension: Secondary | ICD-10-CM | POA: Diagnosis not present

## 2021-08-27 DIAGNOSIS — E7849 Other hyperlipidemia: Secondary | ICD-10-CM | POA: Diagnosis not present

## 2021-09-05 DIAGNOSIS — H01002 Unspecified blepharitis right lower eyelid: Secondary | ICD-10-CM | POA: Diagnosis not present

## 2021-09-05 DIAGNOSIS — H26493 Other secondary cataract, bilateral: Secondary | ICD-10-CM | POA: Diagnosis not present

## 2021-09-05 DIAGNOSIS — H01001 Unspecified blepharitis right upper eyelid: Secondary | ICD-10-CM | POA: Diagnosis not present

## 2021-09-05 DIAGNOSIS — H01004 Unspecified blepharitis left upper eyelid: Secondary | ICD-10-CM | POA: Diagnosis not present

## 2021-09-11 DIAGNOSIS — G894 Chronic pain syndrome: Secondary | ICD-10-CM | POA: Diagnosis not present

## 2021-09-11 DIAGNOSIS — I10 Essential (primary) hypertension: Secondary | ICD-10-CM | POA: Diagnosis not present

## 2021-09-11 DIAGNOSIS — H9201 Otalgia, right ear: Secondary | ICD-10-CM | POA: Diagnosis not present

## 2021-09-11 DIAGNOSIS — M159 Polyosteoarthritis, unspecified: Secondary | ICD-10-CM | POA: Diagnosis not present

## 2021-09-11 DIAGNOSIS — J449 Chronic obstructive pulmonary disease, unspecified: Secondary | ICD-10-CM | POA: Diagnosis not present

## 2021-09-11 DIAGNOSIS — Z681 Body mass index (BMI) 19 or less, adult: Secondary | ICD-10-CM | POA: Diagnosis not present

## 2021-10-03 ENCOUNTER — Other Ambulatory Visit: Payer: Self-pay | Admitting: Pulmonary Disease

## 2021-10-10 ENCOUNTER — Telehealth: Payer: Self-pay

## 2021-10-10 NOTE — Telephone Encounter (Signed)
-----   Message from [place], MD sent at 10/08/2021  3:49 PM EDT ----- ?Regarding: Follow-up ?Please schedule follow-up and arrange for CT Chest without contrast prior to visit in June 2023. ? ?If no answer, please send letter to patient ? ?

## 2021-10-22 DIAGNOSIS — M461 Sacroiliitis, not elsewhere classified: Secondary | ICD-10-CM | POA: Diagnosis not present

## 2021-10-22 DIAGNOSIS — Z681 Body mass index (BMI) 19 or less, adult: Secondary | ICD-10-CM | POA: Diagnosis not present

## 2021-10-22 DIAGNOSIS — G894 Chronic pain syndrome: Secondary | ICD-10-CM | POA: Diagnosis not present

## 2021-10-22 DIAGNOSIS — M159 Polyosteoarthritis, unspecified: Secondary | ICD-10-CM | POA: Diagnosis not present

## 2021-10-22 DIAGNOSIS — I10 Essential (primary) hypertension: Secondary | ICD-10-CM | POA: Diagnosis not present

## 2021-10-22 DIAGNOSIS — J449 Chronic obstructive pulmonary disease, unspecified: Secondary | ICD-10-CM | POA: Diagnosis not present

## 2021-10-25 DIAGNOSIS — I10 Essential (primary) hypertension: Secondary | ICD-10-CM | POA: Diagnosis not present

## 2021-10-25 DIAGNOSIS — E782 Mixed hyperlipidemia: Secondary | ICD-10-CM | POA: Diagnosis not present

## 2021-12-12 DIAGNOSIS — J449 Chronic obstructive pulmonary disease, unspecified: Secondary | ICD-10-CM | POA: Diagnosis not present

## 2021-12-12 DIAGNOSIS — G894 Chronic pain syndrome: Secondary | ICD-10-CM | POA: Diagnosis not present

## 2021-12-12 DIAGNOSIS — I10 Essential (primary) hypertension: Secondary | ICD-10-CM | POA: Diagnosis not present

## 2021-12-12 DIAGNOSIS — M159 Polyosteoarthritis, unspecified: Secondary | ICD-10-CM | POA: Diagnosis not present

## 2021-12-12 DIAGNOSIS — Z681 Body mass index (BMI) 19 or less, adult: Secondary | ICD-10-CM | POA: Diagnosis not present

## 2021-12-18 ENCOUNTER — Telehealth: Payer: Self-pay | Admitting: Pulmonary Disease

## 2021-12-18 DIAGNOSIS — R070 Pain in throat: Secondary | ICD-10-CM | POA: Diagnosis not present

## 2021-12-18 DIAGNOSIS — R918 Other nonspecific abnormal finding of lung field: Secondary | ICD-10-CM

## 2021-12-18 DIAGNOSIS — H9201 Otalgia, right ear: Secondary | ICD-10-CM | POA: Diagnosis not present

## 2021-12-18 DIAGNOSIS — H903 Sensorineural hearing loss, bilateral: Secondary | ICD-10-CM | POA: Diagnosis not present

## 2021-12-18 DIAGNOSIS — J449 Chronic obstructive pulmonary disease, unspecified: Secondary | ICD-10-CM

## 2021-12-18 NOTE — Telephone Encounter (Signed)
-----   Message from [place], MD sent at 10/08/2021  3:49 PM EDT ----- Regarding: Follow-up Please schedule follow-up and arrange for CT Chest without contrast prior to visit in June 2023.   If no answer, please send letter to patient

## 2021-12-18 NOTE — Telephone Encounter (Signed)
   message on the order states ct chest wo but a ct chest w was put in      Dr. Loanne Drilling, could you please confirm which CT order you would like [place] to place?   Thanks!

## 2021-12-19 NOTE — Telephone Encounter (Signed)
Cab someone put in a new order for me  then the one that I have is a Ct chest W

## 2021-12-19 NOTE — Telephone Encounter (Signed)
CT chest without contrast. Thank you!

## 2021-12-19 NOTE — Telephone Encounter (Signed)
I have placed the new order in for patient.

## 2021-12-26 ENCOUNTER — Ambulatory Visit: Payer: Medicare Other | Admitting: Pulmonary Disease

## 2022-01-08 ENCOUNTER — Other Ambulatory Visit: Payer: Medicare Other

## 2022-01-24 ENCOUNTER — Ambulatory Visit (HOSPITAL_COMMUNITY)
Admission: RE | Admit: 2022-01-24 | Discharge: 2022-01-24 | Disposition: A | Discharge status: Home / Self Care | Payer: Medicare Other | Source: Ambulatory Visit | Attending: Pulmonary Disease | Admitting: Pulmonary Disease

## 2022-01-24 DIAGNOSIS — J439 Emphysema, unspecified: Secondary | ICD-10-CM | POA: Diagnosis not present

## 2022-01-24 DIAGNOSIS — R918 Other nonspecific abnormal finding of lung field: Secondary | ICD-10-CM | POA: Diagnosis not present

## 2022-01-24 DIAGNOSIS — J449 Chronic obstructive pulmonary disease, unspecified: Secondary | ICD-10-CM | POA: Diagnosis not present

## 2022-01-24 DIAGNOSIS — R911 Solitary pulmonary nodule: Secondary | ICD-10-CM | POA: Diagnosis not present

## 2022-02-05 ENCOUNTER — Ambulatory Visit: Payer: Medicare Other | Admitting: Pulmonary Disease

## 2022-02-05 ENCOUNTER — Encounter: Payer: Self-pay | Admitting: Pulmonary Disease

## 2022-02-05 VITALS — BP 120/68 | HR 97 | Temp 97.9°F | Ht 65.0 in | Wt 97.6 lb

## 2022-02-05 DIAGNOSIS — J432 Centrilobular emphysema: Secondary | ICD-10-CM | POA: Diagnosis not present

## 2022-02-05 MED ORDER — SPIRIVA HANDIHALER 18 MCG IN CAPS: 18.0000 ug | ORAL_CAPSULE | Freq: Every day | RESPIRATORY_TRACT | 3 refills | Status: DC

## 2022-02-05 MED ORDER — SPIRIVA RESPIMAT 2.5 MCG/ACT IN AERS: 2.0000 | INHALATION_SPRAY | Freq: Every day | RESPIRATORY_TRACT | 0 refills | Status: DC

## 2022-02-05 NOTE — Patient Instructions (Signed)
RML narrowing with intermittent atelectasis - improvised Chronic inflammatory/infectious changes --After shared decision making, patient prefers to minimize invasive procedures and continue conservative management --CONTINUE Mucinex as needed --START n-acetylcysteine up to 1200 mg  Emphysema - stable Mild bronchiectasis --CONTINUE Spiriva 18 mcg ONE inhalation daily --CONTINUE Albuterol as needed for shortness of breath or wheezing --Encourage regular aerobic activity including daily walking  Tobacco abuse Patient is an active smoker. We discussed smoking cessation for 5 minutes. We discussed triggers and stressors and ways to deal with them. We discussed barriers to continued smoking and benefits of smoking cessation. Provided patient with information cessation techniques and interventions including [place] quitline.  Follow-up with me in 6 months

## 2022-02-05 NOTE — Progress Notes (Signed)
Subjective:   PATIENT ID: Brandi Bean: female DOB: 1951/07/17, MRN: [id number]   HPI  Chief Complaint  Patient presents with   Follow-up    Patient states that she feels like her breathing is good, no concerns   Reason for Visit: Follow-up  [name] is a 71 year old female active smoker with emphysema who presents for follow-up.  04/25/21 Since our last visit she was treated with antibiotics for abnormality seen on LDCT. She continues to be compliant with Spiriva. She uses albuterol once a week. Denies shortness of breath but sits on the couch. She will have a productive cough in the mornings. She still actively smokes 3/4 ppd. Daughter encourages her to quit smoking. Last exacerbation in 03/2020.  07/10/21 She was recently seen by NP Groce for abnormal LDCT. Recent CT on 06/28/21 with interval development of RML atelectasis with possible debris/mucoid impaction however endobronchial lesion cannot be rule out. Improved RLL mucoid impaction. Similar LLL sequelae of chronic inflammation/infection. With her history of prior Pseudomonas sputum was collected however today respiratory culture returns with normal flora.  Since our last visit she continues on Spiriva daily. She has baseline mild chronic cough with sputum in the morning associated with chest congestion. She has been Christmas shopping and able to keep up with her daughter. Previously a few weeks ago with wheezing but mucinex has helped. She has also decreased smoking to 1/2 ppd. Consider using the patch and lozenges. Denies recent unexplained fevers, chills, weight loss or night sweats.  02/05/22 Present with her sister in law. Since our last visit she has been compliant with Spiriva. Overall feels her breathing is well-controlled. She sometimes has congested cough and wheeze that requires albuterol once daily. She is smoking 1/2-1 ppd.   Social History: Active smoker She cares for multiple family members including  a younger brother with intellectual disability  Past Medical History:  Diagnosis Date   Anxiety    Arthritis    Back pain    COPD (chronic obstructive pulmonary disease) (HCC)    Depression    Dyspnea    W/ EXERTION+   WHEEZING   Fibromyalgia    Headache    Hypertension    PONV (postoperative nausea and vomiting)    Sleep apnea    ?   MILD NO MACHINE ORDERED, TO BE RETESTED AT HOME AFTER SURGERY   Stroke [facility])    MINI STROKE   15-20 YRS AGO   UTI (urinary tract infection)     Outpatient Medications Prior to Visit  Medication Sig Dispense Refill   albuterol (PROVENTIL HFA;VENTOLIN HFA) 108 (90 BASE) MCG/ACT inhaler Inhale 2 puffs into the lungs every 6 (six) hours as needed for wheezing or shortness of breath.     atorvastatin (LIPITOR) 20 MG tablet Take 20 mg by mouth daily at 6 PM.     bisoprolol-hydrochlorothiazide (ZIAC) 10-6.25 MG per tablet Take 1 tablet by mouth every evening.     guaifenesin (ROBITUSSIN) 100 MG/5ML syrup Take 200 mg by mouth at bedtime as needed for cough.     oxyCODONE (ROXICODONE) 15 MG immediate release tablet Take 15 mg by mouth every 4 (four) hours as needed.     oxyCODONE-acetaminophen (PERCOCET) 10-325 MG tablet Take 1-2 tablets by mouth every 4 (four) hours as needed for pain. (Patient taking differently: Take 1 tablet by mouth 2 (two) times daily as needed for pain.) 60 tablet 0   SPIRIVA HANDIHALER 18 MCG inhalation capsule Inhale THE  contents of ONE capsule via handihaler ONCE DAILY AS DIRECTED 30 capsule 3   No facility-administered medications prior to visit.    Review of Systems  Constitutional:  Negative for chills, diaphoresis, fever, malaise/fatigue and weight loss.  HENT:  Negative for congestion.   Respiratory:  Positive for cough, sputum production and wheezing. Negative for hemoptysis and shortness of breath.   Cardiovascular:  Negative for chest pain, palpitations and leg swelling.   Objective:   Vitals:   02/05/22 1542  BP:  120/68  Pulse: 97  Temp: 97.9 F (36.6 C)  TempSrc: Oral  SpO2: 92%  Weight: 97 lb 9.6 oz (44.3 kg)  Height: '5\' 5"'$  (1.651 m)   SpO2: 92 % O2 Device: None (Room air) Physical Exam: General: Well-appearing, no acute distress HENT: [place], AT Eyes: EOMI, no scleral icterus Respiratory: Clear to auscultation bilaterally.  No crackles, wheezing or rales Cardiovascular: RRR, -M/R/G, no JVD Extremities:-Edema,-tenderness Neuro: AAO x4, CNII-XII grossly intact Psych: Normal mood, normal affect  Data Reviewed:  Imaging: CT 03/23/19 - Mild bronchiectasis with interval development of tree-in-bud opacities in the mid-lower left lung. Mild emphysema. Stable sub-centimeter lung nodules including in RUL  CT Chest 06/20/19 - Slightly improved tree-in-bud opacities in LLL. Some mucous impaction in LLL. Findings may suggestive of aspiration. Mild centrilobular emphysema  CT Lung screen 04/06/20 - Multiple small pulmonary nodules with largest measured 5.50m. Emphysema.  CT Lung screen 11/07/20 - Moderate centrilobular emphysema. Mucous/debris in right lower lobe. Scattered bilateral pulmonary nodules with improved nodularity of LLL  CT Lung screen 06/28/21  - Interval development of RML atelectasis with possible debris/mucoid impaction however endobronchial lesion cannot be rule out. Improved RLL mucoid impaction. Similar LLL sequelae of chronic inflammation/infection. New subpleural ground glass opacity/nodule ~1 cm in anterior LLL. Background mild emphysema  CT Chest 07/19/21 -  Resolution of RML atelectasis likely secondary to mucous plugging. However RML remains narrowed.   CT Chest 01/24/22 - Mild centrilobular and paraseptal emphysema. Unchanged bronchial wall thickening with scattered mucous plugging. Unchanged tree-in-bud in left lower lobe. RML open with focal narrowing in  the right middle lobar bronchus  PFT: 02/07/14 FVC 1.9 (61%) FEV1 1.4 (58%) Ratio 74  Interpretation: No evidence of  obstruction. Reduced FEV1 and FVC may suggest poor effort or restrictive defect  Micro: Sputum 04/27/19 - Pseudomonas aeruginosa, streptococcus Fungal 04/27/19- Aspergillus [place] 04/27/19 - Neg  Sputum 07/05/21 - Normal flora  Assessment & Plan:   Discussion: 71year old female active smoker with emphysema who presents for follow-up. Reviewed CT Chest reviewed and overall stable with unchanged RML bronchus narrowing. Atelectasis has resolved. Discussed utility in bronchoscopy. Discussed smoking cessation and aggressive pulmonary toilet.  RML narrowing with intermittent atelectasis - improved Chronic inflammatory/infectious changes --After shared decision making, patient prefers to minimize invasive procedures and continue conservative management --CONTINUE Mucinex as needed --START n-acetylcysteine up to 1200 mg  Emphysema - stable Mild bronchiectasis --CONTINUE Spiriva 18 mcg ONE inhalation daily --CONTINUE Albuterol as needed for shortness of breath or wheezing --Encourage regular aerobic activity including daily walking  Tobacco abuse Patient is an active smoker. We discussed smoking cessation for 5 minutes. We discussed triggers and stressors and ways to deal with them. We discussed barriers to continued smoking and benefits of smoking cessation. Provided patient with information cessation techniques and interventions including [place] quitline.   Health Maintenance Immunization History  Administered Date(s) Administered   Fluad Quad(high Dose 65+) 04/25/2019, 04/15/2021   Influenza Split 05/28/2012, 08/28/2014  Influenza Whole 05/14/2009, 04/07/2011   Moderna Sars-Covid-2 Vaccination 09/02/2019, 09/30/2019, 07/19/2020   Pneumococcal Conjugate-13 02/19/2015   Pneumococcal Polysaccharide-23 05/28/2012   CT Lung Screen - Recommend annually  No orders of the defined types were placed in this encounter.  Meds ordered this encounter  Medications   SPIRIVA HANDIHALER 18 MCG  inhalation capsule    Sig: Place 1 capsule (18 mcg total) into inhaler and inhale daily.    Dispense:  30 capsule    Refill:  3    This prescription was filled on 07/15/2021. Any refills authorized will be placed on file.   Return in about 6 months (around 08/08/2022).   I have spent a total time of 32-minutes on the day of the appointment including chart review, data review, collecting history, coordinating care and discussing medical diagnosis and plan with the patient/family. Past medical history, allergies, medications were reviewed. Pertinent imaging, labs and tests included in this note have been reviewed and interpreted independently by me.  [place], MD [facility] 02/05/2022 4:03 PM  Office Number [phone]

## 2022-02-09 ENCOUNTER — Encounter: Payer: Self-pay | Admitting: Pulmonary Disease

## 2022-02-09 DIAGNOSIS — J432 Centrilobular emphysema: Secondary | ICD-10-CM | POA: Insufficient documentation

## 2022-02-17 DIAGNOSIS — I872 Venous insufficiency (chronic) (peripheral): Secondary | ICD-10-CM | POA: Diagnosis not present

## 2022-02-17 DIAGNOSIS — J449 Chronic obstructive pulmonary disease, unspecified: Secondary | ICD-10-CM | POA: Diagnosis not present

## 2022-02-17 DIAGNOSIS — M159 Polyosteoarthritis, unspecified: Secondary | ICD-10-CM | POA: Diagnosis not present

## 2022-02-17 DIAGNOSIS — Z6820 Body mass index (BMI) 20.0-20.9, adult: Secondary | ICD-10-CM | POA: Diagnosis not present

## 2022-02-17 DIAGNOSIS — I10 Essential (primary) hypertension: Secondary | ICD-10-CM | POA: Diagnosis not present

## 2022-02-17 DIAGNOSIS — E270 Other adrenocortical overactivity: Secondary | ICD-10-CM | POA: Diagnosis not present

## 2022-02-17 DIAGNOSIS — R609 Edema, unspecified: Secondary | ICD-10-CM | POA: Diagnosis not present

## 2022-02-17 DIAGNOSIS — G894 Chronic pain syndrome: Secondary | ICD-10-CM | POA: Diagnosis not present

## 2022-04-01 DIAGNOSIS — M461 Sacroiliitis, not elsewhere classified: Secondary | ICD-10-CM | POA: Diagnosis not present

## 2022-04-01 DIAGNOSIS — M1991 Primary osteoarthritis, unspecified site: Secondary | ICD-10-CM | POA: Diagnosis not present

## 2022-04-01 DIAGNOSIS — J449 Chronic obstructive pulmonary disease, unspecified: Secondary | ICD-10-CM | POA: Diagnosis not present

## 2022-04-01 DIAGNOSIS — G894 Chronic pain syndrome: Secondary | ICD-10-CM | POA: Diagnosis not present

## 2022-04-01 DIAGNOSIS — M159 Polyosteoarthritis, unspecified: Secondary | ICD-10-CM | POA: Diagnosis not present

## 2022-04-01 DIAGNOSIS — I10 Essential (primary) hypertension: Secondary | ICD-10-CM | POA: Diagnosis not present

## 2022-04-01 DIAGNOSIS — Z681 Body mass index (BMI) 19 or less, adult: Secondary | ICD-10-CM | POA: Diagnosis not present

## 2022-05-13 DIAGNOSIS — M159 Polyosteoarthritis, unspecified: Secondary | ICD-10-CM | POA: Diagnosis not present

## 2022-05-13 DIAGNOSIS — G894 Chronic pain syndrome: Secondary | ICD-10-CM | POA: Diagnosis not present

## 2022-05-13 DIAGNOSIS — M461 Sacroiliitis, not elsewhere classified: Secondary | ICD-10-CM | POA: Diagnosis not present

## 2022-05-13 DIAGNOSIS — M1991 Primary osteoarthritis, unspecified site: Secondary | ICD-10-CM | POA: Diagnosis not present

## 2022-05-13 DIAGNOSIS — I10 Essential (primary) hypertension: Secondary | ICD-10-CM | POA: Diagnosis not present

## 2022-05-13 DIAGNOSIS — J449 Chronic obstructive pulmonary disease, unspecified: Secondary | ICD-10-CM | POA: Diagnosis not present

## 2022-05-13 DIAGNOSIS — Z681 Body mass index (BMI) 19 or less, adult: Secondary | ICD-10-CM | POA: Diagnosis not present

## 2022-05-15 ENCOUNTER — Other Ambulatory Visit: Payer: Self-pay | Admitting: *Deleted

## 2022-05-15 DIAGNOSIS — F17210 Nicotine dependence, cigarettes, uncomplicated: Secondary | ICD-10-CM

## 2022-05-15 DIAGNOSIS — Z122 Encounter for screening for malignant neoplasm of respiratory organs: Secondary | ICD-10-CM

## 2022-05-15 DIAGNOSIS — Z87891 Personal history of nicotine dependence: Secondary | ICD-10-CM

## 2022-06-11 ENCOUNTER — Other Ambulatory Visit (HOSPITAL_COMMUNITY): Payer: Self-pay | Admitting: Internal Medicine

## 2022-06-11 ENCOUNTER — Ambulatory Visit (HOSPITAL_COMMUNITY)
Admission: RE | Admit: 2022-06-11 | Discharge: 2022-06-11 | Disposition: A | Discharge status: Home / Self Care | Payer: Medicare Other | Source: Ambulatory Visit | Attending: Internal Medicine | Admitting: Internal Medicine

## 2022-06-11 DIAGNOSIS — G894 Chronic pain syndrome: Secondary | ICD-10-CM | POA: Diagnosis not present

## 2022-06-11 DIAGNOSIS — Z0001 Encounter for general adult medical examination with abnormal findings: Secondary | ICD-10-CM | POA: Diagnosis not present

## 2022-06-11 DIAGNOSIS — R059 Cough, unspecified: Secondary | ICD-10-CM | POA: Diagnosis not present

## 2022-06-11 DIAGNOSIS — R0781 Pleurodynia: Secondary | ICD-10-CM | POA: Diagnosis not present

## 2022-06-11 DIAGNOSIS — Z681 Body mass index (BMI) 19 or less, adult: Secondary | ICD-10-CM | POA: Diagnosis not present

## 2022-06-11 DIAGNOSIS — J439 Emphysema, unspecified: Secondary | ICD-10-CM | POA: Diagnosis not present

## 2022-06-11 DIAGNOSIS — D518 Other vitamin B12 deficiency anemias: Secondary | ICD-10-CM | POA: Diagnosis not present

## 2022-06-11 DIAGNOSIS — I10 Essential (primary) hypertension: Secondary | ICD-10-CM | POA: Diagnosis not present

## 2022-06-11 DIAGNOSIS — R051 Acute cough: Secondary | ICD-10-CM

## 2022-06-11 DIAGNOSIS — E039 Hypothyroidism, unspecified: Secondary | ICD-10-CM | POA: Diagnosis not present

## 2022-06-11 DIAGNOSIS — J449 Chronic obstructive pulmonary disease, unspecified: Secondary | ICD-10-CM | POA: Diagnosis not present

## 2022-06-11 DIAGNOSIS — R918 Other nonspecific abnormal finding of lung field: Secondary | ICD-10-CM | POA: Diagnosis not present

## 2022-06-11 DIAGNOSIS — S2231XA Fracture of one rib, right side, initial encounter for closed fracture: Secondary | ICD-10-CM | POA: Diagnosis not present

## 2022-06-11 DIAGNOSIS — E559 Vitamin D deficiency, unspecified: Secondary | ICD-10-CM | POA: Diagnosis not present

## 2022-08-07 DIAGNOSIS — I700 Atherosclerosis of aorta: Secondary | ICD-10-CM | POA: Diagnosis not present

## 2022-08-07 DIAGNOSIS — M159 Polyosteoarthritis, unspecified: Secondary | ICD-10-CM | POA: Diagnosis not present

## 2022-08-07 DIAGNOSIS — Z681 Body mass index (BMI) 19 or less, adult: Secondary | ICD-10-CM | POA: Diagnosis not present

## 2022-08-07 DIAGNOSIS — G894 Chronic pain syndrome: Secondary | ICD-10-CM | POA: Diagnosis not present

## 2022-08-07 DIAGNOSIS — J449 Chronic obstructive pulmonary disease, unspecified: Secondary | ICD-10-CM | POA: Diagnosis not present

## 2022-08-14 DIAGNOSIS — E559 Vitamin D deficiency, unspecified: Secondary | ICD-10-CM | POA: Diagnosis not present

## 2022-08-14 DIAGNOSIS — I10 Essential (primary) hypertension: Secondary | ICD-10-CM | POA: Diagnosis not present

## 2022-08-27 DIAGNOSIS — I10 Essential (primary) hypertension: Secondary | ICD-10-CM | POA: Diagnosis not present

## 2022-08-27 DIAGNOSIS — J449 Chronic obstructive pulmonary disease, unspecified: Secondary | ICD-10-CM | POA: Diagnosis not present

## 2022-09-25 DIAGNOSIS — J449 Chronic obstructive pulmonary disease, unspecified: Secondary | ICD-10-CM | POA: Diagnosis not present

## 2022-09-25 DIAGNOSIS — I10 Essential (primary) hypertension: Secondary | ICD-10-CM | POA: Diagnosis not present

## 2022-10-20 DIAGNOSIS — I10 Essential (primary) hypertension: Secondary | ICD-10-CM | POA: Diagnosis not present

## 2022-10-26 DIAGNOSIS — J449 Chronic obstructive pulmonary disease, unspecified: Secondary | ICD-10-CM | POA: Diagnosis not present

## 2022-10-26 DIAGNOSIS — I10 Essential (primary) hypertension: Secondary | ICD-10-CM | POA: Diagnosis not present

## 2022-10-31 DIAGNOSIS — I700 Atherosclerosis of aorta: Secondary | ICD-10-CM | POA: Diagnosis not present

## 2022-10-31 DIAGNOSIS — J449 Chronic obstructive pulmonary disease, unspecified: Secondary | ICD-10-CM | POA: Diagnosis not present

## 2022-10-31 DIAGNOSIS — I10 Essential (primary) hypertension: Secondary | ICD-10-CM | POA: Diagnosis not present

## 2022-10-31 DIAGNOSIS — M159 Polyosteoarthritis, unspecified: Secondary | ICD-10-CM | POA: Diagnosis not present

## 2022-10-31 DIAGNOSIS — G894 Chronic pain syndrome: Secondary | ICD-10-CM | POA: Diagnosis not present

## 2022-11-25 DIAGNOSIS — J449 Chronic obstructive pulmonary disease, unspecified: Secondary | ICD-10-CM | POA: Diagnosis not present

## 2022-11-25 DIAGNOSIS — I10 Essential (primary) hypertension: Secondary | ICD-10-CM | POA: Diagnosis not present

## 2022-12-10 DIAGNOSIS — M1991 Primary osteoarthritis, unspecified site: Secondary | ICD-10-CM | POA: Diagnosis not present

## 2022-12-10 DIAGNOSIS — I10 Essential (primary) hypertension: Secondary | ICD-10-CM | POA: Diagnosis not present

## 2022-12-10 DIAGNOSIS — Z681 Body mass index (BMI) 19 or less, adult: Secondary | ICD-10-CM | POA: Diagnosis not present

## 2022-12-10 DIAGNOSIS — G894 Chronic pain syndrome: Secondary | ICD-10-CM | POA: Diagnosis not present

## 2022-12-10 DIAGNOSIS — J449 Chronic obstructive pulmonary disease, unspecified: Secondary | ICD-10-CM | POA: Diagnosis not present

## 2022-12-10 DIAGNOSIS — M159 Polyosteoarthritis, unspecified: Secondary | ICD-10-CM | POA: Diagnosis not present

## 2022-12-26 DIAGNOSIS — E785 Hyperlipidemia, unspecified: Secondary | ICD-10-CM | POA: Diagnosis not present

## 2022-12-26 DIAGNOSIS — I10 Essential (primary) hypertension: Secondary | ICD-10-CM | POA: Diagnosis not present

## 2022-12-26 DIAGNOSIS — E039 Hypothyroidism, unspecified: Secondary | ICD-10-CM | POA: Diagnosis not present

## 2022-12-26 DIAGNOSIS — J449 Chronic obstructive pulmonary disease, unspecified: Secondary | ICD-10-CM | POA: Diagnosis not present

## 2023-01-26 ENCOUNTER — Ambulatory Visit (HOSPITAL_COMMUNITY)
Admission: RE | Admit: 2023-01-26 | Discharge: 2023-01-26 | Disposition: A | Discharge status: Home / Self Care | Payer: Medicare Other | Source: Ambulatory Visit | Attending: Acute Care | Admitting: Acute Care

## 2023-01-26 DIAGNOSIS — Z87891 Personal history of nicotine dependence: Secondary | ICD-10-CM

## 2023-01-26 DIAGNOSIS — J439 Emphysema, unspecified: Secondary | ICD-10-CM | POA: Diagnosis not present

## 2023-01-26 DIAGNOSIS — Z122 Encounter for screening for malignant neoplasm of respiratory organs: Secondary | ICD-10-CM | POA: Diagnosis not present

## 2023-01-26 DIAGNOSIS — F17210 Nicotine dependence, cigarettes, uncomplicated: Secondary | ICD-10-CM | POA: Diagnosis not present

## 2023-01-26 DIAGNOSIS — I700 Atherosclerosis of aorta: Secondary | ICD-10-CM | POA: Insufficient documentation

## 2023-01-26 DIAGNOSIS — R911 Solitary pulmonary nodule: Secondary | ICD-10-CM | POA: Insufficient documentation

## 2023-01-30 ENCOUNTER — Other Ambulatory Visit: Payer: Self-pay

## 2023-01-30 ENCOUNTER — Telehealth: Payer: Self-pay | Admitting: Acute Care

## 2023-01-30 DIAGNOSIS — R911 Solitary pulmonary nodule: Secondary | ICD-10-CM

## 2023-01-30 DIAGNOSIS — Z87891 Personal history of nicotine dependence: Secondary | ICD-10-CM

## 2023-01-30 NOTE — Telephone Encounter (Signed)
Spoke with patient by phone to review results of LDCT.  Atherosclerosis and emphysema, as previously seen.  Also noted was recurring consolidation in left lung.  Patient still denies choking on food and is not experiencing any symptoms of illness or increase cough or chest congestion.  New lung nodule noted in right lung with recommendation to repeat LDCT in 6 months, as precaution. Patient in agreement. Patient offered OV appt with Dr. Everardo All but states her husband is currently sick and undergoing tests for sudden memory loss. She will call for appointment. Note placed in order for follow up LDCT to check to see if patient made appointment.  Can schedule follow up at that time if needed  Patient acknowledged understanding and had no questions.  Results/plan faxed to PCP. Order placed for 6 months follow up LDCT.

## 2023-02-12 ENCOUNTER — Ambulatory Visit (HOSPITAL_BASED_OUTPATIENT_CLINIC_OR_DEPARTMENT_OTHER): Payer: Medicare Other | Admitting: Pulmonary Disease

## 2023-02-12 ENCOUNTER — Encounter (HOSPITAL_BASED_OUTPATIENT_CLINIC_OR_DEPARTMENT_OTHER): Payer: Self-pay | Admitting: Pulmonary Disease

## 2023-02-12 VITALS — BP 110/68 | HR 73 | Temp 97.9°F | Ht 64.0 in | Wt 92.8 lb

## 2023-02-12 DIAGNOSIS — R918 Other nonspecific abnormal finding of lung field: Secondary | ICD-10-CM | POA: Diagnosis not present

## 2023-02-12 DIAGNOSIS — J432 Centrilobular emphysema: Secondary | ICD-10-CM | POA: Diagnosis not present

## 2023-02-12 DIAGNOSIS — F17210 Nicotine dependence, cigarettes, uncomplicated: Secondary | ICD-10-CM | POA: Diagnosis not present

## 2023-02-12 DIAGNOSIS — R911 Solitary pulmonary nodule: Secondary | ICD-10-CM | POA: Diagnosis not present

## 2023-02-12 NOTE — Patient Instructions (Signed)
  New RUL 5.3 mm nodule RML narrowing with intermittent atelectasis - resolved Chronic inflammatory/infectious changes --CT Chest Lung Screen on 01/26/23 reviewed. --Will follow-up on CT Screen in 6 months (07/2023) --CONTINUE Mucinex as needed  Emphysema - persistent symptoms on Bevespi Mild bronchiectasis --CONTINUE Bevespi TWO puffs TWICE a day --CONTINUE Albuterol as needed for shortness of breath or wheezing --Encourage regular aerobic activity including daily walking  Tobacco abuse Patient is an active smoker. We discussed smoking cessation for 5 minutes. We discussed triggers and stressors and ways to deal with them. We discussed barriers to continued smoking and benefits of smoking cessation. Provided patient with information cessation techniques and interventions including [place] quitline.

## 2023-02-12 NOTE — Progress Notes (Unsigned)
Subjective:   PATIENT ID: Brandi Bean GENDER: female DOB: 12/28/1950, MRN: [id number]   HPI  Chief Complaint  Patient presents with   Follow-up    Pt states her breathing is about the same. [facility] 01/26/23.    Reason for Visit: Follow-up  Brandi Bean is a 72 year old female active smoker with emphysema who presents for follow-up.  04/25/21 Since our last visit she was treated with antibiotics for abnormality seen on LDCT. She continues to be compliant with Spiriva. She uses albuterol once a week. Denies shortness of breath but sits on the couch. She will have a productive cough in the mornings. She still actively smokes 3/4 ppd. Daughter encourages her to quit smoking. Last exacerbation in 03/2020.  07/10/21 She was recently seen by NP Groce for abnormal LDCT. Recent CT on 06/28/21 with interval development of RML atelectasis with possible debris/mucoid impaction however endobronchial lesion cannot be rule out. Improved RLL mucoid impaction. Similar LLL sequelae of chronic inflammation/infection. With her history of prior Pseudomonas sputum was collected however today respiratory culture returns with normal flora.  Since our last visit she continues on Spiriva daily. She has baseline mild chronic cough with sputum in the morning associated with chest congestion. She has been Christmas shopping and able to keep up with her daughter. Previously a few weeks ago with wheezing but mucinex has helped. She has also decreased smoking to 1/2 ppd. Consider using the patch and lozenges. Denies recent unexplained fevers, chills, weight loss or night sweats.  02/05/22 Present with her sister in law. Since our last visit she has been compliant with Spiriva. Overall feels her breathing is well-controlled. She sometimes has congested cough and wheeze that requires albuterol once daily. She is smoking 1/2-1 ppd.   02/12/23 Presents with daughters Brandi Bean and Brandi Bean. She continues to smoker, smoking 1 ppd.  Has been stressed with family medical issues (husband with memory issues/Alzheimers). Since our last visit she has had LDCT which was abnormal.  She reports shortness of breath with uphill and upstairs but activity is more limited by back pain. Some wheezing and also coughing with smoking. No exacerbation. Taking bevespi twice a day - getting it free for a year. Taking albuterol 0-2 times a day.  Social History: Active smoker She cares for multiple family members including a younger brother with intellectual disability  Past Medical History:  Diagnosis Date   Anxiety    Arthritis    Back pain    COPD (chronic obstructive pulmonary disease) (HCC)    Depression    Dyspnea    W/ EXERTION+   WHEEZING   Fibromyalgia    Headache    Hypertension    PONV (postoperative nausea and vomiting)    Sleep apnea    ?   MILD NO MACHINE ORDERED, TO BE RETESTED AT HOME AFTER SURGERY   Stroke [facility])    MINI STROKE   15-20 YRS AGO   UTI (urinary tract infection)     Outpatient Medications Prior to Visit  Medication Sig Dispense Refill   DULoxetine (CYMBALTA) 30 MG capsule Take 30 mg by mouth daily.     furosemide (LASIX) 20 MG tablet Take 20-40 mg by mouth daily as needed.     Glycopyrrolate-Formoterol (BEVESPI AEROSPHERE) 9-4.8 MCG/ACT AERO Inhale 2 puffs into the lungs 2 (two) times daily.     albuterol (PROVENTIL HFA;VENTOLIN HFA) 108 (90 BASE) MCG/ACT inhaler Inhale 2 puffs into the lungs every 6 (six) hours as needed for  wheezing or shortness of breath.     atorvastatin (LIPITOR) 20 MG tablet Take 20 mg by mouth daily at 6 PM.     bisoprolol-hydrochlorothiazide (ZIAC) 10-6.25 MG per tablet Take 1 tablet by mouth every evening.     guaifenesin (ROBITUSSIN) 100 MG/5ML syrup Take 200 mg by mouth at bedtime as needed for cough.     nitrofurantoin (MACRODANTIN) 50 MG capsule Take 50 mg by mouth daily.     oxyCODONE (ROXICODONE) 15 MG immediate release tablet Take 15 mg by mouth every 4 (four) hours as  needed.     oxyCODONE-acetaminophen (PERCOCET) 10-325 MG tablet Take 1-2 tablets by mouth every 4 (four) hours as needed for pain. (Patient not taking: Reported on 02/12/2023) 60 tablet 0   SPIRIVA HANDIHALER 18 MCG inhalation capsule Place 1 capsule (18 mcg total) into inhaler and inhale daily. (Patient not taking: Reported on 02/12/2023) 30 capsule 3   Tiotropium Bromide Monohydrate (SPIRIVA RESPIMAT) 2.5 MCG/ACT AERS Inhale 2 puffs into the lungs daily. (Patient not taking: Reported on 02/12/2023) 4 g 0   No facility-administered medications prior to visit.    Review of Systems  Constitutional:  Negative for chills, diaphoresis, fever, malaise/fatigue and weight loss.  HENT:  Negative for congestion.   Respiratory:  Positive for cough, sputum production, shortness of breath and wheezing. Negative for hemoptysis.   Cardiovascular:  Negative for chest pain, palpitations and leg swelling.   Objective:   Vitals:   02/12/23 1055  BP: 110/68  Pulse: 73  Temp: 97.9 F (36.6 C)  TempSrc: Oral  SpO2: 96%  Weight: 92 lb 12.8 oz (42.1 kg)  Height: 5\' 4"  (1.626 m)   SpO2: 96 %  Physical Exam: General: Chronically ill-appearing, no acute distress HENT: [place], AT Eyes: EOMI, no scleral icterus Respiratory: Diminished but clear to auscultation bilaterally.  No crackles, wheezing or rales Cardiovascular: RRR, -M/R/G, no JVD Extremities:-Edema,-tenderness Neuro: AAO x4, CNII-XII grossly intact Psych: Normal mood, normal affect   Data Reviewed:  Imaging: CT 03/23/19 - Mild bronchiectasis with interval development of tree-in-bud opacities in the mid-lower left lung. Mild emphysema. Stable sub-centimeter lung nodules including in RUL  CT Chest 06/20/19 - Slightly improved tree-in-bud opacities in LLL. Some mucous impaction in LLL. Findings may suggestive of aspiration. Mild centrilobular emphysema  CT Lung screen 04/06/20 - Multiple small pulmonary nodules with largest measured 5.74mm.  Emphysema.  CT Lung screen 11/07/20 - Moderate centrilobular emphysema. Mucous/debris in right lower lobe. Scattered bilateral pulmonary nodules with improved nodularity of LLL  CT Lung screen 06/28/21  - Interval development of RML atelectasis with possible debris/mucoid impaction however endobronchial lesion cannot be rule out. Improved RLL mucoid impaction. Similar LLL sequelae of chronic inflammation/infection. New subpleural ground glass opacity/nodule ~1 cm in anterior LLL. Background mild emphysema  CT Chest 07/19/21 -  Resolution of RML atelectasis likely secondary to mucous plugging. However RML remains narrowed.   CT Chest 01/24/22 - Mild centrilobular and paraseptal emphysema. Unchanged bronchial wall thickening with scattered mucous plugging. Unchanged tree-in-bud in left lower lobe. RML open with focal narrowing in  the right middle lobar bronchus  CT Lung screen 01/26/23 - Mild centrilobular emphysema. LLL consolidation with surrounding nodules, stable compared to before. New RUL nodule on 5.3 mm  PFT: 02/07/14 FVC 1.9 (61%) FEV1 1.4 (58%) Ratio 74  Interpretation: No evidence of obstruction. Reduced FEV1 and FVC may suggest poor effort or restrictive defect  Micro: Sputum 04/27/19 - Pseudomonas aeruginosa, streptococcus Fungal 04/27/19- Aspergillus [employer] 04/27/19 -  Neg  Sputum 07/05/21 - Normal flora  Assessment & Plan:   Discussion: 72 year old female active smoker with emphysema, bronchiectasis who presents for follow-up. We reviewed CT screen in detail with new RUL nodule present. Follow-up CT scan recommended in 6 months. Counseled patient and family regarding risk of malignancy 15.2% (low) on Mayo solitary nodule calculator. Discussed risks and benefits of increased frequency of imaging. Counseled on smoking cessation however patient not ready to quit. Discussed clinical course and management of COPD including bronchodilator regimen, preventive care including vaccinations and  action plan for exacerbation. Prefers to stay on Bevespi despite persistent symptoms.  New RUL 5.3 mm nodule RML narrowing with intermittent atelectasis - resolved Chronic inflammatory/infectious changes --CT Chest Lung Screen on 01/26/23 reviewed. --Will follow-up on CT Screen in 6 months (07/2023) --CONTINUE Mucinex as needed  Emphysema - persistent symptoms on Bevespi Mild bronchiectasis --CONTINUE Bevespi TWO puffs TWICE a day --CONTINUE Albuterol as needed for shortness of breath or wheezing --Encourage regular aerobic activity including daily walking  Tobacco abuse Patient is an active smoker. We discussed smoking cessation for 5 minutes. We discussed triggers and stressors and ways to deal with them. We discussed barriers to continued smoking and benefits of smoking cessation. Provided patient with information cessation techniques and interventions including [place] quitline.   Health Maintenance Immunization History  Administered Date(s) Administered   Fluad Quad(high Dose 65+) 04/25/2019, 04/15/2021   Influenza Split 05/28/2012, 08/28/2014   Influenza Whole 05/14/2009, 04/07/2011   Moderna Sars-Covid-2 Vaccination 09/02/2019, 09/30/2019, 07/19/2020   Pneumococcal Conjugate-13 02/19/2015   Pneumococcal Polysaccharide-23 05/28/2012   CT Lung Screen - Recommend annually  No orders of the defined types were placed in this encounter.  No orders of the defined types were placed in this encounter.  Return in about 6 months (around 08/15/2023) for after CT scan.   I have spent a total time of 35-minutes on the day of the appointment including chart review, data review, collecting history, coordinating care and discussing medical diagnosis and plan with the patient/family. Past medical history, allergies, medications were reviewed. Pertinent imaging, labs and tests included in this note have been reviewed and interpreted independently by me.  Yelena Metzer Mechele Collin, MD [facility] 02/12/2023 11:21 AM  Office Number [phone]

## 2023-02-17 ENCOUNTER — Encounter (HOSPITAL_BASED_OUTPATIENT_CLINIC_OR_DEPARTMENT_OTHER): Payer: Self-pay | Admitting: Pulmonary Disease

## 2023-03-17 DIAGNOSIS — H01001 Unspecified blepharitis right upper eyelid: Secondary | ICD-10-CM | POA: Diagnosis not present

## 2023-03-17 DIAGNOSIS — H01005 Unspecified blepharitis left lower eyelid: Secondary | ICD-10-CM | POA: Diagnosis not present

## 2023-03-17 DIAGNOSIS — H01002 Unspecified blepharitis right lower eyelid: Secondary | ICD-10-CM | POA: Diagnosis not present

## 2023-03-17 DIAGNOSIS — H01004 Unspecified blepharitis left upper eyelid: Secondary | ICD-10-CM | POA: Diagnosis not present

## 2023-03-31 DIAGNOSIS — H01002 Unspecified blepharitis right lower eyelid: Secondary | ICD-10-CM | POA: Diagnosis not present

## 2023-03-31 DIAGNOSIS — H01004 Unspecified blepharitis left upper eyelid: Secondary | ICD-10-CM | POA: Diagnosis not present

## 2023-03-31 DIAGNOSIS — H01001 Unspecified blepharitis right upper eyelid: Secondary | ICD-10-CM | POA: Diagnosis not present

## 2023-03-31 DIAGNOSIS — H01005 Unspecified blepharitis left lower eyelid: Secondary | ICD-10-CM | POA: Diagnosis not present

## 2023-04-13 DIAGNOSIS — G894 Chronic pain syndrome: Secondary | ICD-10-CM | POA: Diagnosis not present

## 2023-04-13 DIAGNOSIS — Z681 Body mass index (BMI) 19 or less, adult: Secondary | ICD-10-CM | POA: Diagnosis not present

## 2023-04-13 DIAGNOSIS — J449 Chronic obstructive pulmonary disease, unspecified: Secondary | ICD-10-CM | POA: Diagnosis not present

## 2023-04-13 DIAGNOSIS — M159 Polyosteoarthritis, unspecified: Secondary | ICD-10-CM | POA: Diagnosis not present

## 2023-04-27 DIAGNOSIS — G894 Chronic pain syndrome: Secondary | ICD-10-CM | POA: Diagnosis not present

## 2023-04-27 DIAGNOSIS — M159 Polyosteoarthritis, unspecified: Secondary | ICD-10-CM | POA: Diagnosis not present

## 2023-04-27 DIAGNOSIS — J449 Chronic obstructive pulmonary disease, unspecified: Secondary | ICD-10-CM | POA: Diagnosis not present

## 2023-06-11 ENCOUNTER — Other Ambulatory Visit: Payer: Self-pay | Admitting: Internal Medicine

## 2023-06-11 DIAGNOSIS — Z1231 Encounter for screening mammogram for malignant neoplasm of breast: Secondary | ICD-10-CM

## 2023-06-12 DIAGNOSIS — E039 Hypothyroidism, unspecified: Secondary | ICD-10-CM | POA: Diagnosis not present

## 2023-06-12 DIAGNOSIS — J449 Chronic obstructive pulmonary disease, unspecified: Secondary | ICD-10-CM | POA: Diagnosis not present

## 2023-06-12 DIAGNOSIS — M1991 Primary osteoarthritis, unspecified site: Secondary | ICD-10-CM | POA: Diagnosis not present

## 2023-06-12 DIAGNOSIS — G894 Chronic pain syndrome: Secondary | ICD-10-CM | POA: Diagnosis not present

## 2023-06-12 DIAGNOSIS — E7849 Other hyperlipidemia: Secondary | ICD-10-CM | POA: Diagnosis not present

## 2023-06-12 DIAGNOSIS — I700 Atherosclerosis of aorta: Secondary | ICD-10-CM | POA: Diagnosis not present

## 2023-06-12 DIAGNOSIS — M159 Polyosteoarthritis, unspecified: Secondary | ICD-10-CM | POA: Diagnosis not present

## 2023-06-12 DIAGNOSIS — Z0001 Encounter for general adult medical examination with abnormal findings: Secondary | ICD-10-CM | POA: Diagnosis not present

## 2023-06-12 DIAGNOSIS — D518 Other vitamin B12 deficiency anemias: Secondary | ICD-10-CM | POA: Diagnosis not present

## 2023-06-12 DIAGNOSIS — E559 Vitamin D deficiency, unspecified: Secondary | ICD-10-CM | POA: Diagnosis not present

## 2023-06-12 DIAGNOSIS — I10 Essential (primary) hypertension: Secondary | ICD-10-CM | POA: Diagnosis not present

## 2023-06-12 DIAGNOSIS — Z681 Body mass index (BMI) 19 or less, adult: Secondary | ICD-10-CM | POA: Diagnosis not present

## 2023-06-12 DIAGNOSIS — Z23 Encounter for immunization: Secondary | ICD-10-CM | POA: Diagnosis not present

## 2023-06-15 ENCOUNTER — Other Ambulatory Visit (HOSPITAL_COMMUNITY): Payer: Self-pay | Admitting: Internal Medicine

## 2023-06-15 ENCOUNTER — Ambulatory Visit (HOSPITAL_COMMUNITY)
Admission: RE | Admit: 2023-06-15 | Discharge: 2023-06-15 | Disposition: A | Discharge status: Home / Self Care | Payer: Medicare Other | Source: Ambulatory Visit | Attending: Internal Medicine | Admitting: Internal Medicine

## 2023-06-15 DIAGNOSIS — Z1231 Encounter for screening mammogram for malignant neoplasm of breast: Secondary | ICD-10-CM | POA: Diagnosis not present

## 2023-06-27 DIAGNOSIS — G894 Chronic pain syndrome: Secondary | ICD-10-CM | POA: Diagnosis not present

## 2023-06-27 DIAGNOSIS — J449 Chronic obstructive pulmonary disease, unspecified: Secondary | ICD-10-CM | POA: Diagnosis not present

## 2023-07-13 DIAGNOSIS — I700 Atherosclerosis of aorta: Secondary | ICD-10-CM | POA: Diagnosis not present

## 2023-07-13 DIAGNOSIS — E7849 Other hyperlipidemia: Secondary | ICD-10-CM | POA: Diagnosis not present

## 2023-07-13 DIAGNOSIS — E039 Hypothyroidism, unspecified: Secondary | ICD-10-CM | POA: Diagnosis not present

## 2023-07-13 DIAGNOSIS — D518 Other vitamin B12 deficiency anemias: Secondary | ICD-10-CM | POA: Diagnosis not present

## 2023-07-13 DIAGNOSIS — E559 Vitamin D deficiency, unspecified: Secondary | ICD-10-CM | POA: Diagnosis not present

## 2023-07-28 DIAGNOSIS — J449 Chronic obstructive pulmonary disease, unspecified: Secondary | ICD-10-CM | POA: Diagnosis not present

## 2023-07-28 DIAGNOSIS — I10 Essential (primary) hypertension: Secondary | ICD-10-CM | POA: Diagnosis not present

## 2023-07-28 DIAGNOSIS — E876 Hypokalemia: Secondary | ICD-10-CM | POA: Diagnosis not present

## 2023-07-30 ENCOUNTER — Ambulatory Visit (HOSPITAL_COMMUNITY)
Admission: RE | Admit: 2023-07-30 | Discharge: 2023-07-30 | Disposition: A | Discharge status: Home / Self Care | Payer: Medicare Other | Source: Ambulatory Visit | Attending: Acute Care | Admitting: Acute Care

## 2023-07-30 DIAGNOSIS — R911 Solitary pulmonary nodule: Secondary | ICD-10-CM | POA: Diagnosis not present

## 2023-07-30 DIAGNOSIS — F17210 Nicotine dependence, cigarettes, uncomplicated: Secondary | ICD-10-CM | POA: Diagnosis not present

## 2023-07-30 DIAGNOSIS — Z87891 Personal history of nicotine dependence: Secondary | ICD-10-CM | POA: Insufficient documentation

## 2023-08-11 ENCOUNTER — Other Ambulatory Visit: Payer: Self-pay

## 2023-08-11 DIAGNOSIS — Z87891 Personal history of nicotine dependence: Secondary | ICD-10-CM

## 2023-08-11 DIAGNOSIS — F17210 Nicotine dependence, cigarettes, uncomplicated: Secondary | ICD-10-CM

## 2023-08-11 DIAGNOSIS — I10 Essential (primary) hypertension: Secondary | ICD-10-CM | POA: Diagnosis not present

## 2023-08-11 DIAGNOSIS — G894 Chronic pain syndrome: Secondary | ICD-10-CM | POA: Diagnosis not present

## 2023-08-11 DIAGNOSIS — M159 Polyosteoarthritis, unspecified: Secondary | ICD-10-CM | POA: Diagnosis not present

## 2023-08-11 DIAGNOSIS — Z122 Encounter for screening for malignant neoplasm of respiratory organs: Secondary | ICD-10-CM

## 2023-08-12 ENCOUNTER — Encounter (HOSPITAL_BASED_OUTPATIENT_CLINIC_OR_DEPARTMENT_OTHER): Payer: Self-pay | Admitting: Pulmonary Disease

## 2023-08-12 ENCOUNTER — Ambulatory Visit (HOSPITAL_BASED_OUTPATIENT_CLINIC_OR_DEPARTMENT_OTHER): Payer: Medicare Other | Admitting: Pulmonary Disease

## 2023-08-12 VITALS — BP 92/68 | HR 80 | Resp 16 | Ht 64.0 in | Wt 91.9 lb

## 2023-08-12 DIAGNOSIS — R911 Solitary pulmonary nodule: Secondary | ICD-10-CM

## 2023-08-12 DIAGNOSIS — J4489 Other specified chronic obstructive pulmonary disease: Secondary | ICD-10-CM | POA: Diagnosis not present

## 2023-08-12 DIAGNOSIS — J439 Emphysema, unspecified: Secondary | ICD-10-CM | POA: Diagnosis not present

## 2023-08-12 DIAGNOSIS — Z720 Tobacco use: Secondary | ICD-10-CM

## 2023-08-12 NOTE — Patient Instructions (Addendum)
 RUL nodule, stable Chronic inflammatory/infectious changes --REFER to back to lung cancer screening program. Due 07/2024 --CONTINUE Mucinex as needed  Emphysema Mild bronchiectasis --CONTINUE Breztri TWO puffs TWICE a day. Please call me to confirm if this is the correct inhaler --CONTINUE Albuterol  as needed for shortness of breath or wheezing --Encourage regular aerobic activity including daily walking  Tobacco abuse Patient is an active smoker. Recommend 15 cigarettes a day by next visit We discussed smoking cessation for 3 minutes. We discussed triggers and stressors and ways to deal with them. We discussed barriers to continued smoking and benefits of smoking cessation.

## 2023-08-12 NOTE — Progress Notes (Signed)
 Subjective:   PATIENT ID: Brandi Bean GENDER: female DOB: 26-Jun-1951, MRN: [id number]   HPI  Chief Complaint  Patient presents with   Follow-up    Breathing is same as last time. PCP put on bevespi   Reason for Visit: Follow-up  [name] is a 73 year old female active smoker with emphysema who presents for follow-up.  04/25/21 Since our last visit she was treated with antibiotics for abnormality seen on LDCT. She continues to be compliant with Spiriva . She uses albuterol  once a week. Denies shortness of breath but sits on the couch. She will have a productive cough in the mornings. She still actively smokes 3/4 ppd. Daughter encourages her to quit smoking. Last exacerbation in 03/2020.  07/10/21 She was recently seen by NP Groce for abnormal LDCT. Recent CT on 06/28/21 with interval development of RML atelectasis with possible debris/mucoid impaction however endobronchial lesion cannot be rule out. Improved RLL mucoid impaction. Similar LLL sequelae of chronic inflammation/infection. With her history of prior Pseudomonas sputum was collected however today respiratory culture returns with normal flora.  Since our last visit she continues on Spiriva  daily. She has baseline mild chronic cough with sputum in the morning associated with chest congestion. She has been Christmas shopping and able to keep up with her daughter. Previously a few weeks ago with wheezing but mucinex has helped. She has also decreased smoking to 1/2 ppd. Consider using the patch and lozenges. Denies recent unexplained fevers, chills, weight loss or night sweats.  02/05/22 Present with her sister in law. Since our last visit she has been compliant with Spiriva . Overall feels her breathing is well-controlled. She sometimes has congested cough and wheeze that requires albuterol  once daily. She is smoking 1/2-1 ppd.   02/12/23 Presents with daughters Brandi Bean and Brandi Bean. She continues to smoker, smoking 1 ppd. Has  been stressed with family medical issues (husband with memory issues/Alzheimers). Since our last visit she has had LDCT which was abnormal.  She reports shortness of breath with uphill and upstairs but activity is more limited by back pain. Some wheezing and also coughing with smoking. No exacerbation. Taking bevespi twice a day - getting it free for a year. Taking albuterol  0-2 times a day.  08/12/23 Since our last visit she is overall doing well. She believes she was started on Breztri two weeks ago which is a step of Bevespi. She still has chronic cough, shortness of breath with activity and wheezing. Uses rescue inhalers 1-2 times a day when symptoms are bad but no limitation in activity. Continues to smoke 1 ppd, has not tried quitting due to stressors with dealing with her husband's Alzheimer's.  Social History: Active smoker She cares for multiple family members including a younger brother with intellectual disability  Past Medical History:  Diagnosis Date   Anxiety    Arthritis    Back pain    COPD (chronic obstructive pulmonary disease) (HCC)    Depression    Dyspnea    W/ EXERTION+   WHEEZING   Fibromyalgia    Headache    Hypertension    PONV (postoperative nausea and vomiting)    Sleep apnea    ?   MILD NO MACHINE ORDERED, TO BE RETESTED AT HOME AFTER SURGERY   Stroke [facility])    MINI STROKE   15-20 YRS AGO   UTI (urinary tract infection)     Outpatient Medications Prior to Visit  Medication Sig Dispense Refill   albuterol  (PROVENTIL   HFA;VENTOLIN  HFA) 108 (90 BASE) MCG/ACT inhaler Inhale 2 puffs into the lungs every 6 (six) hours as needed for wheezing or shortness of breath.     atorvastatin (LIPITOR) 20 MG tablet Take 20 mg by mouth daily at 6 PM.     bisoprolol -hydrochlorothiazide  (ZIAC ) 10-6.25 MG per tablet Take 1 tablet by mouth every evening.     DULoxetine (CYMBALTA) 30 MG capsule Take 30 mg by mouth daily.     furosemide (LASIX) 20 MG tablet Take 20-40 mg by mouth  daily as needed.     Glycopyrrolate -Formoterol  (BEVESPI AEROSPHERE) 9-4.8 MCG/ACT AERO Inhale 2 puffs into the lungs 2 (two) times daily.     guaifenesin (ROBITUSSIN) 100 MG/5ML syrup Take 200 mg by mouth at bedtime as needed for cough.     nitrofurantoin  (MACRODANTIN ) 50 MG capsule Take 50 mg by mouth daily.     oxyCODONE  (ROXICODONE ) 15 MG immediate release tablet Take 15 mg by mouth every 4 (four) hours as needed.     oxyCODONE -acetaminophen  (PERCOCET) 10-325 MG tablet Take 1-2 tablets by mouth every 4 (four) hours as needed for pain. (Patient not taking: Reported on 02/12/2023) 60 tablet 0   No facility-administered medications prior to visit.    Review of Systems  Constitutional:  Negative for chills, diaphoresis, fever, malaise/fatigue and weight loss.  HENT:  Negative for congestion.   Respiratory:  Positive for cough, sputum production, shortness of breath and wheezing. Negative for hemoptysis.   Cardiovascular:  Negative for chest pain, palpitations and leg swelling.   Objective:   Vitals:   08/12/23 0838  BP: 92/68  Pulse: 80  Resp: 16  SpO2: 96%  Weight: 91 lb 14.4 oz (41.7 kg)  Height: 5\' 4"  (1.626 m)    SpO2: 96 %  Physical Exam: General: Chronically-appearing, no acute distress HENT: [place], AT Eyes: EOMI, no scleral icterus Respiratory: Diminished but clear to auscultation bilaterally.  No crackles, wheezing or rales Cardiovascular: RRR, -M/R/G, no JVD Extremities:-Edema,-tenderness Neuro: AAO x4, CNII-XII grossly intact Psych: Normal mood, normal affect   Data Reviewed:  Imaging: CT 03/23/19 - Mild bronchiectasis with interval development of tree-in-bud opacities in the mid-lower left lung. Mild emphysema. Stable sub-centimeter lung nodules including in RUL  CT Chest 06/20/19 - Slightly improved tree-in-bud opacities in LLL. Some mucous impaction in LLL. Findings may suggestive of aspiration. Mild centrilobular emphysema  CT Lung screen 04/06/20 - Multiple  small pulmonary nodules with largest measured 5.87mm. Emphysema.  CT Lung screen 11/07/20 - Moderate centrilobular emphysema. Mucous/debris in right lower lobe. Scattered bilateral pulmonary nodules with improved nodularity of LLL  CT Lung screen 06/28/21  - Interval development of RML atelectasis with possible debris/mucoid impaction however endobronchial lesion cannot be rule out. Improved RLL mucoid impaction. Similar LLL sequelae of chronic inflammation/infection. New subpleural ground glass opacity/nodule ~1 cm in anterior LLL. Background mild emphysema  CT Chest 07/19/21 -  Resolution of RML atelectasis likely secondary to mucous plugging. However RML remains narrowed.   CT Chest 01/24/22 - Mild centrilobular and paraseptal emphysema. Unchanged bronchial wall thickening with scattered mucous plugging. Unchanged tree-in-bud in left lower lobe. RML open with focal narrowing in  the right middle lobar bronchus  CT Lung screen 01/26/23 - Mild centrilobular emphysema. LLL consolidation with surrounding nodules, stable compared to before. New RUL nodule on 5.3 mm  CT Lung Screen 07/30/23 - Mild centrilobular emphysema. Similar sized RUL 6.1 mm, overall stable but continued surveillance. Remaining nodules stable with largest 4.8 mm.  PFT: 02/07/14 FVC  1.9 (61%) FEV1 1.4 (58%) Ratio 74  Interpretation: No evidence of obstruction. Reduced FEV1 and FVC may suggest poor effort or restrictive defect  Micro: Sputum 04/27/19 - Pseudomonas aeruginosa, streptococcus Fungal 04/27/19- Aspergillus [employer] 04/27/19 - Neg  Sputum 07/05/21 - Normal flora  Assessment & Plan:   Discussion: 73 year old female active smoker. Reviewed CT lung screen with stable nodules, recommend returning to annual screening. Discussed clinical course and management of COPD including smoking cessation, bronchodilator regimen, preventive care including vaccinations and action plan for exacerbation.  RUL nodule, stable Chronic  inflammatory/infectious changes --REFER to back to lung cancer screening program --CONTINUE Mucinex as needed  Emphysema Mild bronchiectasis --CONTINUE Breztri TWO puffs TWICE a day. Please call me to confirm if this is the correct inhaler --CONTINUE Albuterol  as needed for shortness of breath or wheezing --Encourage regular aerobic activity including daily walking  Tobacco abuse Patient is an active smoker. Recommend 15 cigarettes a day by next visit We discussed smoking cessation for 3 minutes. We discussed triggers and stressors and ways to deal with them. We discussed barriers to continued smoking and benefits of smoking cessation.   Health Maintenance Immunization History  Administered Date(s) Administered   Fluad Quad(high Dose 65+) 04/25/2019, 04/15/2021   Influenza Split 05/28/2012, 08/28/2014   Influenza Whole 05/14/2009, 04/07/2011   Moderna Sars-Covid-2 Vaccination 09/02/2019, 09/30/2019, 07/19/2020   Pneumococcal Conjugate-13 02/19/2015   Pneumococcal Polysaccharide-23 05/28/2012   CT Lung Screen - Recommend annually  No orders of the defined types were placed in this encounter.  No orders of the defined types were placed in this encounter.  Return in about 3 months (around 11/10/2023).   I have spent a total time of 35-minutes on the day of the appointment including chart review, data review, collecting history, coordinating care and discussing medical diagnosis and plan with the patient/family. Past medical history, allergies, medications were reviewed. Pertinent imaging, labs and tests included in this note have been reviewed and interpreted independently by me.  Brandi Senat Genetta Kenning, MD [facility] 08/12/2023 8:58 AM  Office Number [phone]

## 2023-08-28 DIAGNOSIS — J449 Chronic obstructive pulmonary disease, unspecified: Secondary | ICD-10-CM | POA: Diagnosis not present

## 2023-08-28 DIAGNOSIS — I10 Essential (primary) hypertension: Secondary | ICD-10-CM | POA: Diagnosis not present

## 2023-10-08 DIAGNOSIS — R0781 Pleurodynia: Secondary | ICD-10-CM | POA: Diagnosis not present

## 2023-10-08 DIAGNOSIS — I10 Essential (primary) hypertension: Secondary | ICD-10-CM | POA: Diagnosis not present

## 2023-10-08 DIAGNOSIS — M159 Polyosteoarthritis, unspecified: Secondary | ICD-10-CM | POA: Diagnosis not present

## 2023-10-08 DIAGNOSIS — G894 Chronic pain syndrome: Secondary | ICD-10-CM | POA: Diagnosis not present

## 2023-10-08 DIAGNOSIS — S7002XA Contusion of left hip, initial encounter: Secondary | ICD-10-CM | POA: Diagnosis not present

## 2023-10-08 DIAGNOSIS — J449 Chronic obstructive pulmonary disease, unspecified: Secondary | ICD-10-CM | POA: Diagnosis not present

## 2023-10-08 DIAGNOSIS — Z681 Body mass index (BMI) 19 or less, adult: Secondary | ICD-10-CM | POA: Diagnosis not present

## 2023-11-03 ENCOUNTER — Telehealth (HOSPITAL_BASED_OUTPATIENT_CLINIC_OR_DEPARTMENT_OTHER): Payer: Self-pay

## 2023-11-03 NOTE — Telephone Encounter (Signed)
 Per pts last visit Return to annual screening CT   Copied from CRM [id number]. Topic: Referral - Question >> Nov 03, 2023 10:40 AM Brandi Bean wrote: Reason for CRM: Patient called regarding imaging scan before her appointment on April 22nd with Dr.Ellison. Patient stated she typically has imaging done prior to her appointments and she is wondering if this will be done before her upcoming appointment on April 22nd.

## 2023-11-17 ENCOUNTER — Encounter (HOSPITAL_BASED_OUTPATIENT_CLINIC_OR_DEPARTMENT_OTHER): Payer: Self-pay | Admitting: Pulmonary Disease

## 2023-11-17 ENCOUNTER — Ambulatory Visit (HOSPITAL_BASED_OUTPATIENT_CLINIC_OR_DEPARTMENT_OTHER): Payer: Medicare Other | Admitting: Pulmonary Disease

## 2023-11-17 ENCOUNTER — Telehealth (HOSPITAL_BASED_OUTPATIENT_CLINIC_OR_DEPARTMENT_OTHER): Payer: Self-pay | Admitting: Pulmonary Disease

## 2023-11-17 ENCOUNTER — Other Ambulatory Visit (HOSPITAL_BASED_OUTPATIENT_CLINIC_OR_DEPARTMENT_OTHER): Payer: Self-pay

## 2023-11-17 VITALS — BP 108/64 | HR 82 | Ht 64.0 in | Wt 85.4 lb

## 2023-11-17 DIAGNOSIS — J439 Emphysema, unspecified: Secondary | ICD-10-CM | POA: Diagnosis not present

## 2023-11-17 DIAGNOSIS — R911 Solitary pulmonary nodule: Secondary | ICD-10-CM | POA: Diagnosis not present

## 2023-11-17 DIAGNOSIS — F17210 Nicotine dependence, cigarettes, uncomplicated: Secondary | ICD-10-CM | POA: Diagnosis not present

## 2023-11-17 DIAGNOSIS — Z720 Tobacco use: Secondary | ICD-10-CM

## 2023-11-17 DIAGNOSIS — J432 Centrilobular emphysema: Secondary | ICD-10-CM

## 2023-11-17 NOTE — Telephone Encounter (Signed)
 FYI

## 2023-11-17 NOTE — Patient Instructions (Addendum)
 RUL nodule, stable - Similar sized RUL 6.1 mm, overall stable Chronic inflammatory/infectious changes --Enrolled in lung screen. Next due 07/2024 --CONTINUE Mucinex as needed  Emphysema Mild bronchiectasis --CONTINUE Breztri TWO puffs TWICE a day. Please call me to confirm if this is the correct inhaler --CONTINUE Albuterol  as needed for shortness of breath or wheezing --Encourage regular aerobic activity including daily walking  Tobacco abuse Patient is an active smoker. We discussed smoking cessation for >5 minutes. We discussed triggers and stressors and ways to deal with them. We discussed barriers to continued smoking and benefits of smoking cessation. Provided patient with information cessation techniques and interventions including [place] quitline.

## 2023-11-17 NOTE — Telephone Encounter (Signed)
 Copied from CRM [id number]. Topic: Clinical - Medication Question >> Nov 17, 2023  3:53 PM Hilton Lucky wrote: Reason for CRM: Patient returning call to inform Dr. Washington Hacker that she is on the Breztri inhaler.

## 2023-11-17 NOTE — Progress Notes (Signed)
 Subjective:   PATIENT ID: Brandi Bean GENDER: female DOB: 1951/06/03, MRN: [id number]   HPI  Chief Complaint  Patient presents with   Follow-up    COPD   Reason for Visit: Follow-up  [name] is a 73 year old female active smoker with emphysema who presents for follow-up.  04/25/21 Since our last visit she was treated with antibiotics for abnormality seen on LDCT. She continues to be compliant with Spiriva . She uses albuterol  once a week. Denies shortness of breath but sits on the couch. She will have a productive cough in the mornings. She still actively smokes 3/4 ppd. Daughter encourages her to quit smoking. Last exacerbation in 03/2020.  07/10/21 She was recently seen by NP Groce for abnormal LDCT. Recent CT on 06/28/21 with interval development of RML atelectasis with possible debris/mucoid impaction however endobronchial lesion cannot be rule out. Improved RLL mucoid impaction. Similar LLL sequelae of chronic inflammation/infection. With her history of prior Pseudomonas sputum was collected however today respiratory culture returns with normal flora.  Since our last visit she continues on Spiriva  daily. She has baseline mild chronic cough with sputum in the morning associated with chest congestion. She has been Christmas shopping and able to keep up with her daughter. Previously a few weeks ago with wheezing but mucinex has helped. She has also decreased smoking to 1/2 ppd. Consider using the patch and lozenges. Denies recent unexplained fevers, chills, weight loss or night sweats.  02/05/22 Present with her sister in law. Since our last visit she has been compliant with Spiriva . Overall feels her breathing is well-controlled. She sometimes has congested cough and wheeze that requires albuterol  once daily. She is smoking 1/2-1 ppd.   02/12/23 Presents with daughters Abran Abrahams and Valinda Gault. She continues to smoker, smoking 1 ppd. Has been stressed with family medical issues (husband  with memory issues/Alzheimers). Since our last visit she has had LDCT which was abnormal.  She reports shortness of breath with uphill and upstairs but activity is more limited by back pain. Some wheezing and also coughing with smoking. No exacerbation. Taking bevespi twice a day - getting it free for a year. Taking albuterol  0-2 times a day.  08/12/23 Since our last visit she is overall doing well. She believes she was started on Breztri two weeks ago which is a step of Bevespi. She still has chronic cough, shortness of breath with activity and wheezing. Uses rescue inhalers 1-2 times a day when symptoms are bad but no limitation in activity. Continues to smoke 1 ppd, has not tried quitting due to stressors with dealing with her husband's Alzheimer's.  11/17/23 Daughter present. Since our last visit she has decreased smoking to 3/4 ppd. She continues chronic cough, wheezing and shortness of breath. Improves with albuterol . Not sure if she is on Bevespi or Breztri but is only using it once a day.   Social History: Active smoker She cares for multiple family members including a younger brother with intellectual disability  Past Medical History:  Diagnosis Date   Anxiety    Arthritis    Back pain    COPD (chronic obstructive pulmonary disease) (HCC)    Depression    Dyspnea    W/ EXERTION+   WHEEZING   Fibromyalgia    Headache    Hypertension    PONV (postoperative nausea and vomiting)    Sleep apnea    ?   MILD NO MACHINE ORDERED, TO BE RETESTED AT HOME AFTER SURGERY  Stroke [facility])    MINI STROKE   15-20 YRS AGO   UTI (urinary tract infection)     Outpatient Medications Prior to Visit  Medication Sig Dispense Refill   albuterol  (PROVENTIL  HFA;VENTOLIN  HFA) 108 (90 BASE) MCG/ACT inhaler Inhale 2 puffs into the lungs every 6 (six) hours as needed for wheezing or shortness of breath.     atorvastatin (LIPITOR) 20 MG tablet Take 20 mg by mouth daily at 6 PM.      bisoprolol -hydrochlorothiazide  (ZIAC ) 10-6.25 MG per tablet Take 1 tablet by mouth every evening.     DULoxetine (CYMBALTA) 30 MG capsule Take 30 mg by mouth daily.     furosemide (LASIX) 20 MG tablet Take 20-40 mg by mouth daily as needed.     Glycopyrrolate -Formoterol  (BEVESPI AEROSPHERE) 9-4.8 MCG/ACT AERO Inhale 2 puffs into the lungs 2 (two) times daily.     guaifenesin (ROBITUSSIN) 100 MG/5ML syrup Take 200 mg by mouth at bedtime as needed for cough.     nitrofurantoin  (MACRODANTIN ) 50 MG capsule Take 50 mg by mouth daily.     oxyCODONE  (ROXICODONE ) 15 MG immediate release tablet Take 15 mg by mouth every 4 (four) hours as needed.     oxyCODONE -acetaminophen  (PERCOCET) 10-325 MG tablet Take 1-2 tablets by mouth every 4 (four) hours as needed for pain. 60 tablet 0   No facility-administered medications prior to visit.    Review of Systems  Constitutional:  Negative for chills, diaphoresis, fever, malaise/fatigue and weight loss.  HENT:  Negative for congestion.   Respiratory:  Positive for cough, sputum production, shortness of breath and wheezing. Negative for hemoptysis.   Cardiovascular:  Negative for chest pain, palpitations and leg swelling.   Objective:   Vitals:   11/17/23 1344  BP: 108/64  Pulse: 82  SpO2: 92%  Weight: 85 lb 6.4 oz (38.7 kg)  Height: 5\' 4"  (1.626 m)    SpO2: 92 %  Physical Exam: General: Chronically ill-appearing, no acute distress HENT: [place], AT Eyes: EOMI, no scleral icterus Respiratory: Diminished to auscultation bilaterally.  No crackles, wheezing or rales Cardiovascular: RRR, -M/R/G, no JVD Extremities:-Edema,-tenderness Neuro: AAO x4, CNII-XII grossly intact Psych: Normal mood, normal affect  Data Reviewed:  Imaging: CT 03/23/19 - Mild bronchiectasis with interval development of tree-in-bud opacities in the mid-lower left lung. Mild emphysema. Stable sub-centimeter lung nodules including in RUL  CT Chest 06/20/19 - Slightly improved  tree-in-bud opacities in LLL. Some mucous impaction in LLL. Findings may suggestive of aspiration. Mild centrilobular emphysema  CT Lung screen 04/06/20 - Multiple small pulmonary nodules with largest measured 5.72mm. Emphysema.  CT Lung screen 11/07/20 - Moderate centrilobular emphysema. Mucous/debris in right lower lobe. Scattered bilateral pulmonary nodules with improved nodularity of LLL  CT Lung screen 06/28/21  - Interval development of RML atelectasis with possible debris/mucoid impaction however endobronchial lesion cannot be rule out. Improved RLL mucoid impaction. Similar LLL sequelae of chronic inflammation/infection. New subpleural ground glass opacity/nodule ~1 cm in anterior LLL. Background mild emphysema  CT Chest 07/19/21 -  Resolution of RML atelectasis likely secondary to mucous plugging. However RML remains narrowed.   CT Chest 01/24/22 - Mild centrilobular and paraseptal emphysema. Unchanged bronchial wall thickening with scattered mucous plugging. Unchanged tree-in-bud in left lower lobe. RML open with focal narrowing in  the right middle lobar bronchus  CT Lung screen 01/26/23 - Mild centrilobular emphysema. LLL consolidation with surrounding nodules, stable compared to before. New RUL nodule on 5.3 mm  CT Lung Screen 07/30/23 -  Mild centrilobular emphysema. Similar sized RUL 6.1 mm, overall stable but continued surveillance. Remaining nodules stable with largest 4.8 mm.  PFT: 02/07/14 FVC 1.9 (61%) FEV1 1.4 (58%) Ratio 74  Interpretation: No evidence of obstruction. Reduced FEV1 and FVC may suggest poor effort or restrictive defect  Micro: Sputum 04/27/19 - Pseudomonas aeruginosa, streptococcus Fungal 04/27/19- Aspergillus [employer] 04/27/19 - Neg  Assessment & Plan:   Discussion: 73 year old female active smoker who presents for follow-up COPD. Uncontrolled COPD however on suboptimal bronchodilator dosing and active smoking. Discussed clinical course and management of COPD  including smoking cessation, bronchodilator regimen, preventive care and action plan for exacerbation.  RUL nodule, stable Chronic inflammatory/infectious changes --Enrolled in lung screen. Next due 07/2024 --CONTINUE Mucinex as needed  Emphysema Mild bronchiectasis --CONTINUE Breztri TWO puffs TWICE a day. Please call me to confirm if this is the correct inhaler --CONTINUE Albuterol  as needed for shortness of breath or wheezing --Encourage regular aerobic activity including daily walking  Tobacco abuse Patient is an active smoker. We discussed smoking cessation for >5 minutes. We discussed triggers and stressors and ways to deal with them. We discussed barriers to continued smoking and benefits of smoking cessation. Provided patient with information cessation techniques and interventions including [place] quitline.   Health Maintenance Immunization History  Administered Date(s) Administered   Fluad Quad(high Dose 65+) 04/25/2019, 04/15/2021   Influenza Split 05/28/2012, 08/28/2014   Influenza Whole 05/14/2009, 04/07/2011   Moderna Sars-Covid-2 Vaccination 09/02/2019, 09/30/2019, 07/19/2020   Pneumococcal Conjugate-13 02/19/2015   Pneumococcal Polysaccharide-23 05/28/2012   CT Lung Screen - Recommend annually  No orders of the defined types were placed in this encounter.  No orders of the defined types were placed in this encounter.  Return in about 6 months (around 05/18/2024).   I have spent a total time of 35-minutes on the day of the appointment including chart review, data review, collecting history, coordinating care and discussing medical diagnosis and plan with the patient/family. Past medical history, allergies, medications were reviewed. Pertinent imaging, labs and tests included in this note have been reviewed and interpreted independently by me.  Brandi Barletta Genetta Kenning, MD  Pulmonary Critical Care 11/17/2023 4:01 PM

## 2023-11-24 DIAGNOSIS — G894 Chronic pain syndrome: Secondary | ICD-10-CM | POA: Diagnosis not present

## 2023-11-24 DIAGNOSIS — Z681 Body mass index (BMI) 19 or less, adult: Secondary | ICD-10-CM | POA: Diagnosis not present

## 2023-11-24 DIAGNOSIS — M159 Polyosteoarthritis, unspecified: Secondary | ICD-10-CM | POA: Diagnosis not present

## 2023-11-24 DIAGNOSIS — J449 Chronic obstructive pulmonary disease, unspecified: Secondary | ICD-10-CM | POA: Diagnosis not present

## 2023-12-14 ENCOUNTER — Encounter: Admitting: Obstetrics & Gynecology

## 2024-01-07 DIAGNOSIS — I10 Essential (primary) hypertension: Secondary | ICD-10-CM | POA: Diagnosis not present

## 2024-01-07 DIAGNOSIS — M159 Polyosteoarthritis, unspecified: Secondary | ICD-10-CM | POA: Diagnosis not present

## 2024-01-07 DIAGNOSIS — E559 Vitamin D deficiency, unspecified: Secondary | ICD-10-CM | POA: Diagnosis not present

## 2024-01-07 DIAGNOSIS — R109 Unspecified abdominal pain: Secondary | ICD-10-CM | POA: Diagnosis not present

## 2024-01-07 DIAGNOSIS — J449 Chronic obstructive pulmonary disease, unspecified: Secondary | ICD-10-CM | POA: Diagnosis not present

## 2024-01-07 DIAGNOSIS — E440 Moderate protein-calorie malnutrition: Secondary | ICD-10-CM | POA: Diagnosis not present

## 2024-01-07 DIAGNOSIS — G894 Chronic pain syndrome: Secondary | ICD-10-CM | POA: Diagnosis not present

## 2024-01-07 DIAGNOSIS — Z681 Body mass index (BMI) 19 or less, adult: Secondary | ICD-10-CM | POA: Diagnosis not present

## 2024-01-19 DIAGNOSIS — B370 Candidal stomatitis: Secondary | ICD-10-CM | POA: Diagnosis not present

## 2024-01-19 DIAGNOSIS — Z681 Body mass index (BMI) 19 or less, adult: Secondary | ICD-10-CM | POA: Diagnosis not present

## 2024-01-20 ENCOUNTER — Emergency Department (HOSPITAL_COMMUNITY)

## 2024-01-20 ENCOUNTER — Observation Stay (HOSPITAL_COMMUNITY)
Admission: EM | Admit: 2024-01-20 | Discharge: 2024-01-22 | Disposition: A | Discharge status: Home / Self Care | Attending: Emergency Medicine | Admitting: Emergency Medicine

## 2024-01-20 ENCOUNTER — Encounter (HOSPITAL_COMMUNITY): Payer: Self-pay

## 2024-01-20 ENCOUNTER — Other Ambulatory Visit: Payer: Self-pay

## 2024-01-20 DIAGNOSIS — B370 Candidal stomatitis: Secondary | ICD-10-CM | POA: Diagnosis not present

## 2024-01-20 DIAGNOSIS — J4489 Other specified chronic obstructive pulmonary disease: Secondary | ICD-10-CM | POA: Diagnosis not present

## 2024-01-20 DIAGNOSIS — F17210 Nicotine dependence, cigarettes, uncomplicated: Secondary | ICD-10-CM | POA: Diagnosis not present

## 2024-01-20 DIAGNOSIS — Z8673 Personal history of transient ischemic attack (TIA), and cerebral infarction without residual deficits: Secondary | ICD-10-CM | POA: Insufficient documentation

## 2024-01-20 DIAGNOSIS — J439 Emphysema, unspecified: Secondary | ICD-10-CM

## 2024-01-20 DIAGNOSIS — E860 Dehydration: Secondary | ICD-10-CM | POA: Insufficient documentation

## 2024-01-20 DIAGNOSIS — Z1152 Encounter for screening for COVID-19: Secondary | ICD-10-CM | POA: Insufficient documentation

## 2024-01-20 DIAGNOSIS — E440 Moderate protein-calorie malnutrition: Secondary | ICD-10-CM | POA: Diagnosis not present

## 2024-01-20 DIAGNOSIS — J449 Chronic obstructive pulmonary disease, unspecified: Secondary | ICD-10-CM | POA: Insufficient documentation

## 2024-01-20 DIAGNOSIS — R634 Abnormal weight loss: Secondary | ICD-10-CM | POA: Diagnosis not present

## 2024-01-20 DIAGNOSIS — Z91040 Latex allergy status: Secondary | ICD-10-CM | POA: Insufficient documentation

## 2024-01-20 DIAGNOSIS — E43 Unspecified severe protein-calorie malnutrition: Secondary | ICD-10-CM | POA: Insufficient documentation

## 2024-01-20 DIAGNOSIS — R062 Wheezing: Secondary | ICD-10-CM | POA: Diagnosis not present

## 2024-01-20 DIAGNOSIS — D72829 Elevated white blood cell count, unspecified: Secondary | ICD-10-CM | POA: Diagnosis not present

## 2024-01-20 DIAGNOSIS — E876 Hypokalemia: Secondary | ICD-10-CM | POA: Diagnosis not present

## 2024-01-20 DIAGNOSIS — I10 Essential (primary) hypertension: Secondary | ICD-10-CM | POA: Diagnosis present

## 2024-01-20 DIAGNOSIS — Z79899 Other long term (current) drug therapy: Secondary | ICD-10-CM | POA: Insufficient documentation

## 2024-01-20 DIAGNOSIS — J029 Acute pharyngitis, unspecified: Secondary | ICD-10-CM | POA: Diagnosis present

## 2024-01-20 LAB — COMPREHENSIVE METABOLIC PANEL WITH GFR
ALT: 15 U/L (ref 0–44)
AST: 22 U/L (ref 15–41)
Albumin: 3.5 g/dL (ref 3.5–5.0)
Alkaline Phosphatase: 63 U/L (ref 38–126)
Anion gap: 17 — ABNORMAL HIGH (ref 5–15)
BUN: 15 mg/dL (ref 8–23)
CO2: 30 mmol/L (ref 22–32)
Calcium: 9.1 mg/dL (ref 8.9–10.3)
Chloride: 86 mmol/L — ABNORMAL LOW (ref 98–111)
Creatinine, Ser: 1.05 mg/dL — ABNORMAL HIGH (ref 0.44–1.00)
GFR, Estimated: 56 mL/min — ABNORMAL LOW (ref >60)
Glucose, Bld: 101 mg/dL — ABNORMAL HIGH (ref 70–99)
Potassium: 2.6 mmol/L — CL (ref 3.5–5.1)
Sodium: 133 mmol/L — ABNORMAL LOW (ref 135–145)
Total Bilirubin: 1.9 mg/dL — ABNORMAL HIGH (ref 0.0–1.2)
Total Protein: 7 g/dL (ref 6.5–8.1)

## 2024-01-20 LAB — CBC WITH DIFFERENTIAL/PLATELET
Abs Immature Granulocytes: 0.08 10*3/uL — ABNORMAL HIGH (ref 0.00–0.07)
Basophils Absolute: 0.1 10*3/uL (ref 0.0–0.1)
Basophils Relative: 0 %
Eosinophils Absolute: 0 10*3/uL (ref 0.0–0.5)
Eosinophils Relative: 0 %
HCT: 44.2 % (ref 36.0–46.0)
Hemoglobin: 15.6 g/dL — ABNORMAL HIGH (ref 12.0–15.0)
Immature Granulocytes: 1 %
Lymphocytes Relative: 15 %
Lymphs Abs: 2.5 10*3/uL (ref 0.7–4.0)
MCH: 31.6 pg (ref 26.0–34.0)
MCHC: 35.3 g/dL (ref 30.0–36.0)
MCV: 89.5 fL (ref 80.0–100.0)
Monocytes Absolute: 1.3 10*3/uL — ABNORMAL HIGH (ref 0.1–1.0)
Monocytes Relative: 7 %
Neutro Abs: 13.1 10*3/uL — ABNORMAL HIGH (ref 1.7–7.7)
Neutrophils Relative %: 77 %
Platelets: 237 10*3/uL (ref 150–400)
RBC: 4.94 MIL/uL (ref 3.87–5.11)
RDW: 12.6 % (ref 11.5–15.5)
WBC: 17.1 10*3/uL — ABNORMAL HIGH (ref 4.0–10.5)
nRBC: 0 % (ref 0.0–0.2)

## 2024-01-20 LAB — RESP PANEL BY RT-PCR (RSV, FLU A&B, COVID)  RVPGX2
Influenza A by PCR: NEGATIVE
Influenza B by PCR: NEGATIVE
Resp Syncytial Virus by PCR: NEGATIVE
SARS Coronavirus 2 by RT PCR: NEGATIVE

## 2024-01-20 LAB — TSH: TSH: 0.662 u[IU]/mL (ref 0.350–4.500)

## 2024-01-20 LAB — MAGNESIUM: Magnesium: 1.6 mg/dL — ABNORMAL LOW (ref 1.7–2.4)

## 2024-01-20 MED ORDER — FLUCONAZOLE 100 MG PO TABS
200.0000 mg | ORAL_TABLET | Freq: Once | ORAL | Status: AC
  Administered 2024-01-20: 200 mg via ORAL
  Filled 2024-01-20: qty 2

## 2024-01-20 MED ORDER — IPRATROPIUM-ALBUTEROL 0.5-2.5 (3) MG/3ML IN SOLN
3.0000 mL | Freq: Once | RESPIRATORY_TRACT | Status: AC
  Administered 2024-01-20: 3 mL via RESPIRATORY_TRACT
  Filled 2024-01-20: qty 3

## 2024-01-20 MED ORDER — LIDOCAINE VISCOUS HCL 2 % MT SOLN
15.0000 mL | Freq: Once | OROMUCOSAL | Status: AC
  Administered 2024-01-20: 15 mL via OROMUCOSAL
  Filled 2024-01-20: qty 15

## 2024-01-20 MED ORDER — POTASSIUM CHLORIDE CRYS ER 20 MEQ PO TBCR
40.0000 meq | EXTENDED_RELEASE_TABLET | Freq: Once | ORAL | Status: AC
  Administered 2024-01-20: 40 meq via ORAL
  Filled 2024-01-20: qty 2

## 2024-01-20 MED ORDER — SODIUM CHLORIDE 0.9 % IV BOLUS
1000.0000 mL | Freq: Once | INTRAVENOUS | Status: AC
  Administered 2024-01-20: 1000 mL via INTRAVENOUS

## 2024-01-20 MED ORDER — OXYCODONE HCL 5 MG PO TABS
15.0000 mg | ORAL_TABLET | Freq: Four times a day (QID) | ORAL | Status: DC | PRN
  Administered 2024-01-20 – 2024-01-21 (×4): 15 mg via ORAL
  Filled 2024-01-20 (×4): qty 3

## 2024-01-20 MED ORDER — ACETAMINOPHEN 325 MG PO TABS: 650.0000 mg | ORAL_TABLET | Freq: Four times a day (QID) | ORAL | Status: DC | PRN

## 2024-01-20 MED ORDER — MAGIC MOUTHWASH W/LIDOCAINE
5.0000 mL | Freq: Once | ORAL | Status: AC
  Administered 2024-01-20: 5 mL via ORAL
  Filled 2024-01-20: qty 5

## 2024-01-20 MED ORDER — ONDANSETRON HCL 4 MG PO TABS: 4.0000 mg | ORAL_TABLET | Freq: Four times a day (QID) | ORAL | Status: DC | PRN

## 2024-01-20 MED ORDER — IPRATROPIUM-ALBUTEROL 0.5-2.5 (3) MG/3ML IN SOLN
3.0000 mL | RESPIRATORY_TRACT | Status: DC | PRN
  Administered 2024-01-21 – 2024-01-22 (×2): 3 mL via RESPIRATORY_TRACT
  Filled 2024-01-20 (×2): qty 3

## 2024-01-20 MED ORDER — POLYETHYLENE GLYCOL 3350 17 G PO PACK: 17.0000 g | PACK | Freq: Every day | ORAL | Status: DC | PRN

## 2024-01-20 MED ORDER — POTASSIUM CHLORIDE 10 MEQ/100ML IV SOLN
10.0000 meq | Freq: Once | INTRAVENOUS | Status: AC
Start: 2024-01-20 — End: 2024-01-20
  Administered 2024-01-20: 10 meq via INTRAVENOUS
  Filled 2024-01-20: qty 100

## 2024-01-20 MED ORDER — MAGIC MOUTHWASH W/LIDOCAINE
15.0000 mL | Freq: Once | ORAL | Status: DC
  Filled 2024-01-20: qty 15

## 2024-01-20 MED ORDER — MAGIC MOUTHWASH
10.0000 mL | Freq: Three times a day (TID) | ORAL | Status: DC | PRN
  Filled 2024-01-20: qty 10

## 2024-01-20 MED ORDER — FLUCONAZOLE 100 MG PO TABS
100.0000 mg | ORAL_TABLET | Freq: Every day | ORAL | Status: DC
  Administered 2024-01-21: 100 mg via ORAL
  Filled 2024-01-20: qty 1

## 2024-01-20 MED ORDER — ENOXAPARIN SODIUM 30 MG/0.3ML IJ SOSY
30.0000 mg | PREFILLED_SYRINGE | INTRAMUSCULAR | Status: DC
  Administered 2024-01-21: 30 mg via SUBCUTANEOUS
  Filled 2024-01-20 (×2): qty 0.3

## 2024-01-20 MED ORDER — ACETAMINOPHEN 650 MG RE SUPP: 650.0000 mg | Freq: Four times a day (QID) | RECTAL | Status: DC | PRN

## 2024-01-20 MED ORDER — ONDANSETRON HCL 4 MG/2ML IJ SOLN: 4.0000 mg | Freq: Four times a day (QID) | INTRAMUSCULAR | Status: DC | PRN

## 2024-01-20 MED ORDER — POTASSIUM CHLORIDE 10 MEQ/100ML IV SOLN
10.0000 meq | INTRAVENOUS | Status: AC
  Administered 2024-01-20 (×2): 10 meq via INTRAVENOUS
  Filled 2024-01-20 (×2): qty 100

## 2024-01-20 MED ORDER — KCL IN DEXTROSE-NACL 40-5-0.9 MEQ/L-%-% IV SOLN: INTRAVENOUS | Status: AC

## 2024-01-20 MED ORDER — MAGIC MOUTHWASH W/LIDOCAINE
10.0000 mL | Freq: Three times a day (TID) | ORAL | Status: DC | PRN
  Administered 2024-01-21 – 2024-01-22 (×5): 10 mL via ORAL
  Filled 2024-01-20 (×7): qty 10

## 2024-01-20 MED ORDER — MAGIC MOUTHWASH
15.0000 mL | Freq: Once | ORAL | Status: DC
  Filled 2024-01-20: qty 15

## 2024-01-20 NOTE — Assessment & Plan Note (Signed)
 Potassium 2.6.  Likely from poor oral intake. -Check magnesium.

## 2024-01-20 NOTE — Assessment & Plan Note (Signed)
 Chart review confirms weight loss but not as drastic as daughter reports.  Weight is down from 106 pounds - 2021 to 79 pounds today.  Last colonoscopy 2015- 6 mm sessile polyp.  On routine annual CT for lung screening-last screen- 07/2023, to follow-up in a year. - Check TSH

## 2024-01-20 NOTE — ED Provider Notes (Signed)
 [place] EMERGENCY DEPARTMENT AT [facility] Provider Note   CSN: [id number] Arrival date & time: 01/20/24  1204     Patient presents with: Oral Swelling   Brandi Bean is a 73 y.o. female tree of COPD, hypertension, hyperlipidemia, TIA presents with complaints of sore throat.  Patient was evaluated at her primary care yesterday was prescribed nystatin for thrush.  She reports poor p.o. intake due to the pain.  She feels like her breathing is at her baseline.   HPI    Past Medical History:  Diagnosis Date   Anxiety    Arthritis    Back pain    COPD (chronic obstructive pulmonary disease) (HCC)    Depression    Dyspnea    W/ EXERTION+   WHEEZING   Fibromyalgia    Headache    Hypertension    PONV (postoperative nausea and vomiting)    Sleep apnea    ?   MILD NO MACHINE ORDERED, TO BE RETESTED AT HOME AFTER SURGERY   Stroke [facility])    MINI STROKE   15-20 YRS AGO   UTI (urinary tract infection)    Past Surgical History:  Procedure Laterality Date   ABDOMINAL HYSTERECTOMY     BLADDER TACK   ANTERIOR CERVICAL DECOMP/DISCECTOMY FUSION N/A 08/05/2016   Procedure: ANTERIOR CERVICAL DECOMPRESSION/DISCECTOMY FUSION  - CERVICAL THREE-FOUR, CERRVICAL FOUR-FIVE, CERVICAL FIVE-SIX;  Surgeon: Victory Gunnels, MD;  Location: [facility] OR;  Service: Neurosurgery;  Laterality: N/A;   CATARACT EXTRACTION W/PHACO Right 02/24/2017   Procedure: CATARACT EXTRACTION PHACO AND INTRAOCULAR LENS PLACEMENT (IOC);  Surgeon: Perley Hamilton, MD;  Location: [facility] ORS;  Service: Ophthalmology;  Laterality: Right;  CDE: 9.96   CATARACT EXTRACTION W/PHACO Left 03/09/2017   Procedure: CATARACT EXTRACTION PHACO AND INTRAOCULAR LENS PLACEMENT LEFT EYE;  Surgeon: Perley Hamilton, MD;  Location: [facility] ORS;  Service: Ophthalmology;  Laterality: Left;  CDE: 7.10   CHOLECYSTECTOMY     COLONOSCOPY N/A 02/14/2014   Procedure: COLONOSCOPY;  Surgeon: Margo LITTIE Haddock, MD;  Location: [facility] ENDO SUITE;  Service: Endoscopy;  Laterality: N/A;   10:00 AM-moved to 1215 Anette Caldron to notify pt   HAND SURGERY     RIGHT   POLYPECTOMY  02/14/2014   Procedure: POLYPECTOMY;  Surgeon: Margo LITTIE Haddock, MD;  Location: [facility] ENDO SUITE;  Service: Endoscopy;;  Sigmoid colon    Prior to Admission medications   Medication Sig Start Date End Date Taking? Authorizing Provider  albuterol  (PROVENTIL  HFA;VENTOLIN  HFA) 108 (90 BASE) MCG/ACT inhaler Inhale 2 puffs into the lungs every 6 (six) hours as needed for wheezing or shortness of breath.   Yes [provider]  atorvastatin (LIPITOR) 20 MG tablet Take 20 mg by mouth daily at 6 PM.   Yes [provider]  bisoprolol -hydrochlorothiazide  (ZIAC ) 10-6.25 MG per tablet Take 1 tablet by mouth every evening.   Yes [provider]  docusate sodium (COLACE) 50 MG capsule Take 50 mg by mouth daily as needed for mild constipation.   Yes [provider]  furosemide (LASIX) 20 MG tablet Take 20-40 mg by mouth daily as needed for fluid or edema. 02/04/23  Yes [provider]  Glycopyrrolate -Formoterol  (BEVESPI AEROSPHERE) 9-4.8 MCG/ACT AERO Inhale 2 puffs into the lungs 2 (two) times daily.   Yes [provider]  mupirocin ointment (BACTROBAN) 2 % Apply 1 Application topically 2 (two) times daily. 01/19/24  Yes [provider]  nitrofurantoin  (MACRODANTIN ) 50 MG capsule Take 50 mg by mouth daily.  Yes [provider]  nystatin (MYCOSTATIN) 100000 UNIT/ML suspension Take 5 mLs by mouth 4 (four) times daily. 01/19/24  Yes [provider]  ondansetron  (ZOFRAN ) 4 MG tablet Take 4 mg by mouth 4 (four) times daily as needed for nausea or vomiting. 01/07/24  Yes [provider]  oxyCODONE  (ROXICODONE ) 15 MG immediate release tablet Take 15 mg by mouth every 4 (four) hours as needed for pain. 10/17/20  Yes [provider]    Allergies: Doxycycline , Hydrocodone , Prednisone , Codeine sulfate, and Latex    Review of Systems  HENT:   Positive for sore throat.     Updated Vital Signs BP (!) 103/57   Pulse 86 Comment: [place] 2 L/M  Temp (!) 97.5 F (36.4 C) (Oral)   Resp 14 Comment: [place] 2 L/M  Ht 5' 4 (1.626 m)   Wt 35.8 kg   SpO2 95% Comment: [place] 2 L/M  BMI 13.56 kg/m   Physical Exam Vitals and nursing note reviewed.  Constitutional:      General: She is not in acute distress.    Appearance: She is well-developed.  HENT:     Head: Normocephalic and atraumatic.     Mouth/Throat:     Comments: Exudate noted on patient's tongue and buccal mucosa, uvula midline no peritonsillar abscess appreciated, no focal lymphadenopathy, no trismus or pooling of secretions  Eyes:     Conjunctiva/sclera: Conjunctivae normal.    Cardiovascular:     Rate and Rhythm: Normal rate and regular rhythm.     Heart sounds: No murmur heard. Pulmonary:     Effort: Pulmonary effort is normal. No respiratory distress.     Comments: Diffuse wheezing without rales Abdominal:     Palpations: Abdomen is soft.     Tenderness: There is no abdominal tenderness.   Musculoskeletal:        General: No swelling.     Cervical back: Neck supple.   Skin:    General: Skin is warm and dry.     Capillary Refill: Capillary refill takes less than 2 seconds.   Neurological:     Mental Status: She is alert.   Psychiatric:        Mood and Affect: Mood normal.     (all labs ordered are listed, but only abnormal results are displayed) Labs Reviewed  CBC WITH DIFFERENTIAL/PLATELET - Abnormal; Notable for the following components:      Result Value   WBC 17.1 (*)    Hemoglobin 15.6 (*)    Neutro Abs 13.1 (*)    Monocytes Absolute 1.3 (*)    Abs Immature Granulocytes 0.08 (*)    All other components within normal limits  COMPREHENSIVE METABOLIC PANEL WITH GFR - Abnormal; Notable for the following components:   Sodium 133 (*)    Potassium 2.6 (*)    Chloride 86 (*)    Glucose, Bld 101 (*)    Creatinine, Ser 1.05 (*)    Total Bilirubin 1.9  (*)    GFR, Estimated 56 (*)    Anion gap 17 (*)    All other components within normal limits  RESP PANEL BY RT-PCR (RSV, FLU A&B, COVID)  [id number]    EKG: EKG Interpretation Date/Time:  Wednesday January 20 2024 14:59:11 EDT Ventricular Rate:  77 PR Interval:  158 QRS Duration:  84 QT Interval:  407 QTC Calculation: 461 R Axis:   98  Text Interpretation: Sinus rhythm Atrial premature complexes Consider left atrial enlargement Probable lateral infarct, age indeterminate  Anteroseptal infarct, age indeterminate Confirmed by Francesca Fallow ([id number]) on 01/20/2024 3:53:15 PM  Radiology: DG Chest 2 View Result Date: 01/20/2024 CLINICAL DATA:  Wheezing EXAM: CHEST - 2 VIEW COMPARISON:  X-ray 06/11/2022.  CT 1 2/25 FINDINGS: Hyperinflation. Chronic lung changes. No consolidation, pneumothorax or effusion. Normal cardiopericardial silhouette. No edema. Curvature of the spine with degenerative changes. Fixation hardware along the cervical spine at the edge of the imaging field. IMPRESSION: Hyperinflation with chronic changes. Electronically Signed   By: Ranell Bring M.D.   On: 01/20/2024 13:40     Procedures   Medications Ordered in the ED  lidocaine  (XYLOCAINE ) 2 % viscous mouth solution 15 mL (15 mLs Mouth/Throat Given 01/20/24 1331)  ipratropium-albuterol  (DUONEB) 0.5-2.5 (3) MG/3ML nebulizer solution 3 mL (3 mLs Nebulization Given 01/20/24 1323)  sodium chloride  0.9 % bolus 1,000 mL (0 mLs Intravenous Stopped 01/20/24 1443)  potassium chloride 10 mEq in 100 mL IVPB (0 mEq Intravenous Stopped 01/20/24 1547)  potassium chloride SA (KLOR-CON M) CR tablet 40 mEq (40 mEq Oral Given 01/20/24 1445)  lidocaine  (XYLOCAINE ) 2 % viscous mouth solution 15 mL (15 mLs Mouth/Throat Given 01/20/24 1544)  sodium chloride  0.9 % bolus 1,000 mL (1,000 mLs Intravenous New Bag/Given 01/20/24 1721)  magic mouthwash w/lidocaine  (5 mLs Oral Given 01/20/24 1718)                                    Medical Decision  Making Amount and/or Complexity of Data Reviewed Labs: ordered. Radiology: ordered.  Risk Prescription drug management.   This patient presents to the ED with chief complaint(s) of sore throat.  The complaint involves an extensive differential diagnosis and also carries with it a high risk of complications and morbidity.   Pertinent past medical history as listed in HPI  The differential diagnosis includes  Candidiasis, strep throat, Ludwig's angina, COPD Additional history obtained: Additional history obtained from family Records reviewed Care Everywhere/External Records  Assessment and management:   Hemodynamically stable, chronically ill appearing patient presents with complaints of sore throat.  Symptoms have been ongoing for the past week.  Was evaluated at her PCP yesterday.  Symptoms were felt to be consistent with thrush and she was prescribed nystatin.  She has been trialing new inhalers at home.  She admits that she has not been rinsing out her mouth.  Pain persists and has had poor p.o. intake.  On exam patient does have exudate on her tongue and buccal mucosa.  She additionally does have blistering of her lower lip with angular cheilitis.  She has diffuse wheezing without Rales.  Noted to be hypoxic in the 80s upon arrival.  Now sitting comfortably.  Feels that her breathing is at baseline.  Ambulation maintain sats 91 to 94%.  Patient does have a leukocytosis, anion gap.   Independent ECG interpretation:  Sinus rhythm, PACs  Independent labs interpretation:  The following labs were independently interpreted:  CBC with leukocytosis of 17.1, CMP with potassium of 2.6, anion gap of 17  Independent visualization and interpretation of imaging: I independently visualized the following imaging with scope of interpretation limited to determining acute life threatening conditions related to emergency care: CXR with chronic COPD changes no acute finding    Consultations obtained:    Hospitalist Dr. Pearlean, agreed for admissoin.   Disposition:   Patient will be admitted for further management for dehydration and poor p.o. intake.  Social Determinants  of Health:   none  This note was dictated with voice recognition software.  Despite best efforts at proofreading, errors may have occurred which can change the documentation meaning.       Final diagnoses:  Thrush  Hypokalemia  Dehydration    ED Discharge Orders          Ordered    lidocaine  (XYLOCAINE ) 2 % solution  As needed        Pending    potassium chloride SA (KLOR-CON M) 20 MEQ tablet  2 times daily        Pending               Donnajean Lynwood VEAR DEVONNA 01/20/24 1924    Francesca Elsie CROME, MD 01/21/24 1243

## 2024-01-20 NOTE — ED Notes (Signed)
 Pt's mouth is red with white patches on her tongue. C/o burning.  Started last week.  Not able to eat or drink anything due to the pain. Seen Dr. Marvine yesterday and prescribed mouthwash, has used couple times.

## 2024-01-20 NOTE — Assessment & Plan Note (Signed)
 Has chronic unchanged cough, denies dyspnea.  O2 sats 86% on room air, improved to 91 to 94% with ambulation on room air.  Initial wheezing on arrival to ED-improved.  Chest x-ray-hyperinflation. -DuoNebs as needed -Hold off on steroids and antibiotics

## 2024-01-20 NOTE — Assessment & Plan Note (Signed)
 Blood pressure systolic 92-125. -Hold bisoprolol  HCTZ to allow for hydration.  She is on Lasix PRN- Hold.

## 2024-01-20 NOTE — H&P (Signed)
 History and Physical    Brandi Bean FMW:[id number] DOB: 10-Mar-1951 DOA: 01/20/2024  PCP: Bertell Satterfield, MD   Patient coming from: Home  I have personally briefly reviewed patient's old medical records in [facility]  Chief Complaint: Mouth Sores  HPI: Brandi Bean is a 73 y.o. female with medical history significant for COPD, hypertension, stroke, tobacco abuse. Patient presented to the ED with complaints of pain in her mouth and tongue of about a week duration, yesterday she noticed several blisters which were very painful.  Today her lips were swollen with blisters also. She was unable to eat or drink anything, with no oral intake in 2 days.  She also reports sore throat.Her outpatient provider started her on nystatin swish and swallow yesterday, which she was barely able to tolerate due to burning sensation in her mouth. She reports she has been using her inhalers the past months without washing her mouth out after use.  Daughter reports significant weight loss for the past 2 years, - with patient size dropping from 12 >> 1 and chronic poor oral intake, early satiety.  She has talked to her outpatient provider about this. She has chronic unchanged cough, no difficulty breathing.  No chest pain.   ED Course: Temperature 98.7.  Heart rate 70s to 92.  Respiratory rate 16-22.  Blood pressure systolic 95 to 125.  O2 sats 86% on room air, improved later - 91 to 94% on room air with ambulation. Potassium 2.6.  WBC 17.1.  Chest x-ray shows hyperinflation. 2 L bolus given.  Nebs given, K supplementation started. Magic mouthwash given with improvement in pain.  Review of Systems: As per HPI all other systems reviewed and negative.  Past Medical History:  Diagnosis Date   Anxiety    Arthritis    Back pain    COPD (chronic obstructive pulmonary disease) (HCC)    Depression    Dyspnea    W/ EXERTION+   WHEEZING   Fibromyalgia    Headache    Hypertension    PONV (postoperative  nausea and vomiting)    Sleep apnea    ?   MILD NO MACHINE ORDERED, TO BE RETESTED AT HOME AFTER SURGERY   Stroke [facility])    MINI STROKE   15-20 YRS AGO   UTI (urinary tract infection)     Past Surgical History:  Procedure Laterality Date   ABDOMINAL HYSTERECTOMY     BLADDER TACK   ANTERIOR CERVICAL DECOMP/DISCECTOMY FUSION N/A 08/05/2016   Procedure: ANTERIOR CERVICAL DECOMPRESSION/DISCECTOMY FUSION  - CERVICAL THREE-FOUR, CERRVICAL FOUR-FIVE, CERVICAL FIVE-SIX;  Surgeon: Victory Gunnels, MD;  Location: [facility] OR;  Service: Neurosurgery;  Laterality: N/A;   CATARACT EXTRACTION W/PHACO Right 02/24/2017   Procedure: CATARACT EXTRACTION PHACO AND INTRAOCULAR LENS PLACEMENT (IOC);  Surgeon: Perley Hamilton, MD;  Location: [facility] ORS;  Service: Ophthalmology;  Laterality: Right;  CDE: 9.96   CATARACT EXTRACTION W/PHACO Left 03/09/2017   Procedure: CATARACT EXTRACTION PHACO AND INTRAOCULAR LENS PLACEMENT LEFT EYE;  Surgeon: Perley Hamilton, MD;  Location: [facility] ORS;  Service: Ophthalmology;  Laterality: Left;  CDE: 7.10   CHOLECYSTECTOMY     COLONOSCOPY N/A 02/14/2014   Procedure: COLONOSCOPY;  Surgeon: Margo LITTIE Haddock, MD;  Location: [facility] ENDO SUITE;  Service: Endoscopy;  Laterality: N/A;  10:00 AM-moved to 1215 An[address] Caldron to notify pt   HAND SURGERY     RIGHT   POLYPECTOMY  02/14/2014   Procedure: POLYPECTOMY;  Surgeon: Margo LITTIE Haddock, MD;  Location: [facility]  ENDO SUITE;  Service: Endoscopy;;  Sigmoid colon     reports that she has been smoking cigarettes. She started smoking about 33 years ago. She has a 33.5 pack-year smoking history. She has been exposed to tobacco smoke. She has never used smokeless tobacco. She reports that she does not drink alcohol and does not use drugs.  Allergies  Allergen Reactions   Doxycycline  Other (See Comments)    Patient got burning and itching above eyes   Hydrocodone  Other (See Comments)    Passes out    Prednisone  Other (See Comments)    Unknown     Codeine Sulfate Itching   Latex  Itching    History reviewed. No pertinent family history.  Prior to Admission medications   Medication Sig Start Date End Date Taking? Authorizing Provider  albuterol  (PROVENTIL  HFA;VENTOLIN  HFA) 108 (90 BASE) MCG/ACT inhaler Inhale 2 puffs into the lungs every 6 (six) hours as needed for wheezing or shortness of breath.   Yes [provider]  atorvastatin (LIPITOR) 20 MG tablet Take 20 mg by mouth daily at 6 PM.   Yes [provider]  bisoprolol -hydrochlorothiazide  (ZIAC ) 10-6.25 MG per tablet Take 1 tablet by mouth every evening.   Yes [provider]  docusate sodium (COLACE) 50 MG capsule Take 50 mg by mouth daily as needed for mild constipation.   Yes [provider]  furosemide (LASIX) 20 MG tablet Take 20-40 mg by mouth daily as needed for fluid or edema. 02/04/23  Yes [provider]  Glycopyrrolate -Formoterol  (BEVESPI AEROSPHERE) 9-4.8 MCG/ACT AERO Inhale 2 puffs into the lungs 2 (two) times daily.   Yes [provider]  mupirocin ointment (BACTROBAN) 2 % Apply 1 Application topically 2 (two) times daily. 01/19/24  Yes [provider]  nitrofurantoin  (MACRODANTIN ) 50 MG capsule Take 50 mg by mouth daily.   Yes [provider]  nystatin (MYCOSTATIN) 100000 UNIT/ML suspension Take 5 mLs by mouth 4 (four) times daily. 01/19/24  Yes [provider]  ondansetron  (ZOFRAN ) 4 MG tablet Take 4 mg by mouth 4 (four) times daily as needed for nausea or vomiting. 01/07/24  Yes [provider]  oxyCODONE  (ROXICODONE ) 15 MG immediate release tablet Take 15 mg by mouth every 4 (four) hours as needed for pain. 10/17/20  Yes [provider]    Physical Exam: Vitals:   01/20/24 1720 01/20/24 1729 01/20/24 1745 01/20/24 1915  BP:    95/60  Bean: 91 92 86 94  Resp: 14 17 14 20   Temp:    98.7 F (37.1 C)  TempSrc:    Oral  SpO2: 91% 90% 95% 93%  Weight:      Height:        Constitutional: Chronically  ill-appearing, thin, calm, comfortable Vitals:   01/20/24 1720 01/20/24 1729 01/20/24 1745 01/20/24 1915  BP:    95/60  Bean: 91 92 86 94  Resp: 14 17 14 20   Temp:    98.7 F (37.1 C)  TempSrc:    Oral  SpO2: 91% 90% 95% 93%  Weight:      Height:       Eyes: PERRL, lids and conjunctivae normal ENMT: Mucous membranes are moist, erythematous tongue and buccal mucosa with some white patches on hard palate.  Angular cheilitis.  Neck: normal, supple, no masses, no thyromegaly Respiratory: Faint expiratory wheezing, no crackles. Normal respiratory effort. No accessory muscle use.  Cardiovascular: Regular rate and rhythm, no murmurs / rubs / gallops. No extremity  edema.  Abdomen: no tenderness, no masses palpated. No hepatosplenomegaly. Bowel sounds positive.  Musculoskeletal: no clubbing / cyanosis. No joint deformity upper and lower extremities. Good ROM, no contractures. Normal muscle tone.  Skin: no rashes, lesions, ulcers. No induration Neurologic: No facial asymmetry, speech fluent, moving extremities spontaneously. Psychiatric: Normal judgment and insight. Alert and oriented x 3. Normal mood.   Labs on Admission: I have personally reviewed following labs and imaging studies  CBC: Recent Labs  Lab 01/20/24 1343  WBC 17.1*  NEUTROABS 13.1*  HGB 15.6*  HCT 44.2  MCV 89.5  PLT 237   Basic Metabolic Panel: Recent Labs  Lab 01/20/24 1343  NA 133*  K 2.6*  CL 86*  CO2 30  GLUCOSE 101*  BUN 15  CREATININE 1.05*  CALCIUM 9.1   GFR: Estimated Creatinine Clearance: 26.6 mL/min (A) (by C-G formula based on SCr of 1.05 mg/dL (H)). Liver Function Tests: Recent Labs  Lab 01/20/24 1343  AST 22  ALT 15  ALKPHOS 63  BILITOT 1.9*  PROT 7.0  ALBUMIN 3.5   Urine analysis:    Component Value Date/Time   COLORURINE YELLOW 02/08/2011 1250   APPEARANCEUR HAZY (A) 02/08/2011 1250   LABSPEC <1.005 (L) 02/08/2011 1250   PHURINE 5.5 02/08/2011 1250   GLUCOSEU NEGATIVE  02/08/2011 1250   HGBUR MODERATE (A) 02/08/2011 1250   BILIRUBINUR NEGATIVE 02/08/2011 1250   KETONESUR NEGATIVE 02/08/2011 1250   PROTEINUR NEGATIVE 02/08/2011 1250   UROBILINOGEN 0.2 02/08/2011 1250   NITRITE NEGATIVE 02/08/2011 1250   LEUKOCYTESUR TRACE (A) 02/08/2011 1250    Radiological Exams on Admission: DG Chest 2 View Result Date: 01/20/2024 CLINICAL DATA:  Wheezing EXAM: CHEST - 2 VIEW COMPARISON:  X-ray 06/11/2022.  CT 1 2/25 FINDINGS: Hyperinflation. Chronic lung changes. No consolidation, pneumothorax or effusion. Normal cardiopericardial silhouette. No edema. Curvature of the spine with degenerative changes. Fixation hardware along the cervical spine at the edge of the imaging field. IMPRESSION: Hyperinflation with chronic changes. Electronically Signed   By: Ranell Bring M.D.   On: 01/20/2024 13:40   EKG: Independently reviewed.  Sinus rhythm, rate 77, PACs.  QTc 461.  No significant change from prior.  Assessment/Plan Principal Problem:   Hypokalemia Active Problems:   Oropharyngeal candidiasis   HYPERTENSION   COPD with chronic bronchitis and emphysema (HCC)   Weight loss  Assessment and Plan: * Hypokalemia Potassium 2.6.  Likely from poor oral intake. -Check magnesium.  Oropharyngeal candidiasis Of 1 week.  Reports using inhalers without rinsing out her mouth after use.  Per current med list- the inhalers currently listed do not contain steroids. Other differentials include immunocompromised status.  She also reports significant weight loss. -Did not tolerate nystatin swish and swallow due to burning from mouth blisters - Magic mouthwash improved symptoms- continue PRN. Contains nystatin-??strenght. - Start oral fluconazole 200 mg x 1 continue 100 mg daily x 7 days - Hgba1c - 2 L bolus given, continue D5 N/s + 40 kcl 100cc/hr x 20hrs - GI consult - N.p.o. after midnight pending GI eval  Weight loss Chart review confirms weight loss but not as drastic as  daughter reports.  Weight is down from 106 pounds - 2021 to 79 pounds today.  Last colonoscopy 2015- 6 mm sessile polyp.  On routine annual CT for lung screening-last screen- 07/2023, to follow-up in a year. - Check TSH  COPD with chronic bronchitis and emphysema (HCC) Has chronic unchanged cough, denies dyspnea.  O2 sats 86% on room  air, improved to 91 to 94% with ambulation on room air.  Initial wheezing on arrival to ED-improved.  Chest x-ray-hyperinflation. -DuoNebs as needed -Hold off on steroids and antibiotics  HYPERTENSION Blood pressure systolic 92-125. -Hold bisoprolol  HCTZ to allow for hydration.  She is on Lasix PRN- Hold.  Leukocytosis-17.1.  Likely stress reaction, oropharyngeal candidiasis.  Denies urinary symptoms.  Chest x-ray is clear.  Vitals stable no suggestion of sepsis. - Trend for now.   DVT prophylaxis: Lovenox Code Status: Full Code- confirmed with patient and daughter at bedside. Family Communication: Daughter- Joen at bedside Disposition Plan: ~ 2 days Consults called: GI consult Admission status:  Obs Tele    Author: Tully FORBES Carwin, MD 01/20/2024 9:01 PM  For on call review [email or web address].

## 2024-01-20 NOTE — ED Notes (Signed)
 Pt ambulated in hallway, RA sats 91-94%

## 2024-01-20 NOTE — ED Notes (Signed)
Admitting MD at the bedside to assess pt.

## 2024-01-20 NOTE — ED Notes (Signed)
Dr. Scheving at bedside 

## 2024-01-20 NOTE — Assessment & Plan Note (Addendum)
 Of 1 week.  Reports using inhalers without rinsing out her mouth after use.  Per current med list- the inhalers currently listed do not contain steroids. Other differentials include immunocompromised status.  She also reports significant weight loss. -Did not tolerate nystatin swish and swallow due to burning from mouth blisters - Magic mouthwash improved symptoms- continue PRN. Contains nystatin-??strenght. - Start oral fluconazole 200 mg x 1 continue 100 mg daily x 7 days - Hgba1c - 2 L bolus given, continue D5 N/s + 40 kcl 100cc/hr x 20hrs - GI consult - N.p.o. after midnight pending GI eval

## 2024-01-20 NOTE — ED Triage Notes (Signed)
 Pt arrived via POV c/o multiple blisters burning in her mouth and reports seeing her PCP yesterday and being prescribed a mouth wash. Pt reports she was told it is Thrush. Pt reports the burning pain is keeping her from being able to eat or use the medicine.

## 2024-01-21 DIAGNOSIS — R918 Other nonspecific abnormal finding of lung field: Secondary | ICD-10-CM | POA: Diagnosis not present

## 2024-01-21 DIAGNOSIS — F17210 Nicotine dependence, cigarettes, uncomplicated: Secondary | ICD-10-CM | POA: Diagnosis not present

## 2024-01-21 DIAGNOSIS — E876 Hypokalemia: Secondary | ICD-10-CM | POA: Diagnosis not present

## 2024-01-21 DIAGNOSIS — R634 Abnormal weight loss: Secondary | ICD-10-CM | POA: Diagnosis not present

## 2024-01-21 DIAGNOSIS — D72829 Elevated white blood cell count, unspecified: Secondary | ICD-10-CM | POA: Diagnosis not present

## 2024-01-21 DIAGNOSIS — B370 Candidal stomatitis: Secondary | ICD-10-CM | POA: Diagnosis not present

## 2024-01-21 DIAGNOSIS — Z860101 Personal history of adenomatous and serrated colon polyps: Secondary | ICD-10-CM | POA: Diagnosis not present

## 2024-01-21 LAB — BASIC METABOLIC PANEL WITH GFR
Anion gap: 7 (ref 5–15)
BUN: 11 mg/dL (ref 8–23)
CO2: 28 mmol/L (ref 22–32)
Calcium: 8.3 mg/dL — ABNORMAL LOW (ref 8.9–10.3)
Chloride: 102 mmol/L (ref 98–111)
Creatinine, Ser: 0.86 mg/dL (ref 0.44–1.00)
GFR, Estimated: >60 mL/min (ref >60)
Glucose, Bld: 117 mg/dL — ABNORMAL HIGH (ref 70–99)
Potassium: 3.4 mmol/L — ABNORMAL LOW (ref 3.5–5.1)
Sodium: 137 mmol/L (ref 135–145)

## 2024-01-21 LAB — CBC
HCT: 37.8 % (ref 36.0–46.0)
Hemoglobin: 13.2 g/dL (ref 12.0–15.0)
MCH: 32.4 pg (ref 26.0–34.0)
MCHC: 34.9 g/dL (ref 30.0–36.0)
MCV: 92.6 fL (ref 80.0–100.0)
Platelets: 203 10*3/uL (ref 150–400)
RBC: 4.08 MIL/uL (ref 3.87–5.11)
RDW: 12.7 % (ref 11.5–15.5)
WBC: 12.1 10*3/uL — ABNORMAL HIGH (ref 4.0–10.5)
nRBC: 0 % (ref 0.0–0.2)

## 2024-01-21 LAB — HEMOGLOBIN A1C
Hgb A1c MFr Bld: 5.1 % (ref 4.8–5.6)
Mean Plasma Glucose: 99.67 mg/dL

## 2024-01-21 LAB — MAGNESIUM: Magnesium: 1.8 mg/dL (ref 1.7–2.4)

## 2024-01-21 MED ORDER — ENSURE PLUS HIGH PROTEIN PO LIQD
237.0000 mL | Freq: Three times a day (TID) | ORAL | Status: DC
  Administered 2024-01-22: 237 mL via ORAL

## 2024-01-21 MED ORDER — ENSURE PLUS HIGH PROTEIN PO LIQD: 237.0000 mL | Freq: Two times a day (BID) | ORAL | Status: DC

## 2024-01-21 MED ORDER — POTASSIUM CHLORIDE CRYS ER 20 MEQ PO TBCR
40.0000 meq | EXTENDED_RELEASE_TABLET | Freq: Once | ORAL | Status: AC
  Administered 2024-01-21: 40 meq via ORAL
  Filled 2024-01-21: qty 2

## 2024-01-21 MED ORDER — ATORVASTATIN CALCIUM 20 MG PO TABS
20.0000 mg | ORAL_TABLET | Freq: Every day | ORAL | Status: DC
  Administered 2024-01-21: 20 mg via ORAL
  Filled 2024-01-21: qty 1

## 2024-01-21 MED ORDER — PHENOL 1.4 % MT LIQD
1.0000 | OROMUCOSAL | Status: DC | PRN
  Administered 2024-01-21: 1 via OROMUCOSAL
  Filled 2024-01-21: qty 177

## 2024-01-21 NOTE — Progress Notes (Signed)
 Mobility Specialist Progress Note:    01/21/24 0954  Mobility  Activity Ambulated with assistance in hallway  Level of Assistance Modified independent, requires aide device or extra time  Assistive Device None  Distance Ambulated (ft) 100 ft  Range of Motion/Exercises Active;All extremities  Activity Response Tolerated well  Mobility Referral Yes  Mobility visit 1 Mobility  Mobility Specialist Start Time (ACUTE ONLY) [id number]  Mobility Specialist Stop Time (ACUTE ONLY) 0954  Mobility Specialist Time Calculation (min) (ACUTE ONLY) 20 min   Pt received in bed, visitor in room. Agreeable to mobility, ModI to stand and ambulate with no AD. Tolerated well, SpO2 90% on 1L after ambulation. Audible SOB after session. Returned supine, all needs met.   Sherrilee Ditty Mobility Specialist Please contact via Special educational needs teacher or  Rehab office at [phone]

## 2024-01-21 NOTE — Care Management Obs Status (Signed)
 MEDICARE OBSERVATION STATUS NOTIFICATION   Patient Details  Name: Brandi Bean MRN: [id number] Date of Birth: [birth year]   Medicare Observation Status Notification Given:  Yes    Duwaine LITTIE Ada 01/21/2024, 11:11 AM

## 2024-01-21 NOTE — Progress Notes (Signed)
 Initial Nutrition Assessment  DOCUMENTATION CODES:   Severe malnutrition in context of chronic illness, Underweight  INTERVENTION:   Ensure Plus High Protein po TID, each supplement provides 350 kcal and 20 grams of protein. Magic cup TID with meals, each supplement provides 290 kcal and 9 grams of protein.  NUTRITION DIAGNOSIS:   Severe Malnutrition related to chronic illness (COPD) as evidenced by severe fat depletion, severe muscle depletion, percent weight loss (11% weight loss within 6 months).  GOAL:   Patient will meet greater than or equal to 90% of their needs  MONITOR:   Diet advancement, PO intake, Labs  REASON FOR ASSESSMENT:   Consult Assessment of nutrition requirement/status  ASSESSMENT:   73 yo female admitted with hypokalemia, oropharyngeal candidiasis. PMH includes COPD, HTN, stroke, tobacco abuse, recent weight loss.  Spoke with patient and her daughter at bedside. Patient has lost a lot of weight over the past 2-4 years. For the past week, she has been eating minimally d/t mouth sores, unable to even drink water  for the past 3 days. She also has a bad tooth that needs to be filed down that is rubbing her tongue and causing pain. Per daughter, diet is supposed to be advanced to full liquids  today. Currently NPO. She tolerated a small amount of Ensure today. She is afraid to try anything PO that may make her mouth/throat hurt. Discussed adding magic cups to meal trays and patient agreed to try them.   Suspect K and mag are low d/t refeeding syndrome as patient has been eating minimally for the past week. K and mag are being monitored by MD and repleted as needed.   Labs reviewed.  K 3.4 <-- 2.6 Mag 1.8 <-- 1.6  Medications reviewed and include IV and PO potassium chloride for repletion. IVF: D5 NS with 40 mEq/L KCl at 100 ml/h  Weight history reviewed. Patient with 11% weight loss within the past 6 months, which is severe for the time frame. Patient meets  criteria for severe malnutrition, given severe depletion of muscle and subcutaneous fat mass with severe weight loss.  NUTRITION - FOCUSED PHYSICAL EXAM:  Flowsheet Row Most Recent Value  Orbital Region Severe depletion  Upper Arm Region Severe depletion  Thoracic and Lumbar Region Severe depletion  Buccal Region Severe depletion  Temple Region Severe depletion  Clavicle Bone Region Severe depletion  Clavicle and Acromion Bone Region Severe depletion  Scapular Bone Region Severe depletion  Dorsal Hand Severe depletion  Patellar Region Severe depletion  Anterior Thigh Region Severe depletion  Posterior Calf Region Moderate depletion  Edema (RD Assessment) None  Hair Reviewed  Eyes Reviewed  Mouth Other (Comment)  [thrush]  Skin Reviewed  Nails Reviewed    Diet Order:   Diet Order             Diet NPO time specified  Diet effective midnight                   EDUCATION NEEDS:   Education needs have been addressed  Skin:  Skin Assessment: Reviewed RN Assessment  Last BM:  6/23  Height:   Ht Readings from Last 1 Encounters:  01/20/24 5' 5 (1.651 m)    Weight:   Wt Readings from Last 1 Encounters:  01/20/24 37.2 kg    Ideal Body Weight:  56.8 kg  BMI:  Body mass index is 13.65 kg/m.  Estimated Nutritional Needs:   Kcal:  1400-1600  Protein:  60-75 gm  Fluid:  >/=  1.5 L   Suzen HUNT RD, LDN, CNSC Contact via secure chat. If unavailable, use group chat RD Inpatient.

## 2024-01-21 NOTE — Progress Notes (Signed)
 PROGRESS NOTE    Brandi Bean  FMW:[id number] DOB: 12/31/1950 DOA: 01/20/2024 PCP: Bertell Satterfield, MD   Brief Narrative:  HPI: Brandi Bean is a 73 y.o. female with medical history significant for COPD, hypertension, stroke, tobacco abuse. Patient presented to the ED with complaints of pain in her mouth and tongue of about a week duration, yesterday she noticed several blisters which were very painful.  Today her lips were swollen with blisters also. She was unable to eat or drink anything, with no oral intake in 2 days.  She also reports sore throat.Her outpatient provider started her on nystatin swish and swallow yesterday, which she was barely able to tolerate due to burning sensation in her mouth. She reports she has been using her inhalers the past months without washing her mouth out after use.  Daughter reports significant weight loss for the past 2 years, - with patient size dropping from 12 >> 1 and chronic poor oral intake, early satiety.  She has talked to her outpatient provider about this. She has chronic unchanged cough, no difficulty breathing.  No chest pain.    ED Course: Temperature 98.7.  Heart rate 70s to 92.  Respiratory rate 16-22.  Blood pressure systolic 95 to 125.  O2 sats 86% on room air, improved later - 91 to 94% on room air with ambulation. Potassium 2.6.  WBC 17.1.  Chest x-ray shows hyperinflation. 2 L bolus given.  Nebs given, K supplementation started. Magic mouthwash given with improvement in pain.    Assessment & Plan:   Principal Problem:   Hypokalemia Active Problems:   Oropharyngeal candidiasis   HYPERTENSION   COPD with chronic bronchitis and emphysema (HCC)   Weight loss  Hypokalemia/hypomagnesemia Potassium 2.6.  Likely from poor oral intake.  Replenished yesterday, improved but still low 3.4.  Will replenish again.  Magnesium was low.  Not sure if this was replenished.  Rechecking today.   Oropharyngeal candidiasis Diagnosed a week ago.   Was given nystatin swish and swallow as outpatient but she could not take it.  She has been started on Diflucan here.  Per daughter, she had significantly swollen lips yesterday and her lips are much better today.  She still has dryness and some dried blood around lips.  Very minimal whitish cheesy material on the tongue.  GI was consulted by admitting hospitalist and patient was kept n.p.o. overnight.  Daughter is requesting lidocaine  mouthwash, patient already has Magic mouthwash ordered.   Unintentional weight loss Chart review confirms weight loss but not as drastic as daughter reports.  Daughter reports weight loss of about 70 pounds in 2 years.   Last colonoscopy 2015- 6 mm sessile polyp.  On routine annual CT for lung screening-last screen- 07/2023, to follow-up in a year.  TSH normal.  No other complaints which may warrant another imaging study.   COPD with chronic bronchitis and emphysema (HCC) Has chronic unchanged cough, denies dyspnea.  O2 sats 86% on room air, improved to 91 to 94% with ambulation on room air.  Initial wheezing on arrival to ED-which improved however she is wheezy again on my examination but still denies any shortness of breath.  X-ray negative, continue DuoNeb.  I think this is her baseline as she continues to smoke.  Hopefully she will improve just with DuoNeb.  If no improvement by tomorrow, will consider steroids.   Essential hypertension: Blood pressure still low. -Hold bisoprolol  HCTZ to allow for hydration.  She is on Lasix PRN-  Hold.   Leukocytosis-17.1.  Likely stress reaction, oropharyngeal candidiasis.  No signs or symptoms of infection.  Leukocytosis improved.  She is afebrile.  Hyperlipidemia: Resume statin.  Severe protein calorie malnutrition: BMI only 13.65.  Will start on protein supplement.  Dietitian consulted.  DVT prophylaxis: enoxaparin (LOVENOX) injection 30 mg Start: 01/20/24 2200   Code Status: Full Code  Family Communication: Daughter present  at bedside.  Plan of care discussed with patient in length and he/she verbalized understanding and agreed with it.  Status is: Observation The patient will require care spanning > 2 midnights and should be moved to inpatient because: Still with mouth pain, needs to be seen by GI, perhaps may need some more workup for weight loss.   Estimated body mass index is 13.65 kg/m as calculated from the following:   Height as of this encounter: 5' 5 (1.651 m).   Weight as of this encounter: 37.2 kg.    Nutritional Assessment: Body mass index is 13.65 kg/m.SABRA Seen by dietician.  I agree with the assessment and plan as outlined below: Nutrition Status:        . Skin Assessment: I have examined the patient's skin and I agree with the wound assessment as performed by the wound care RN as outlined below:    Consultants:  GI  Procedures:  As above  Antimicrobials:  Anti-infectives (From admission, onward)    Start     Dose/Rate Route Frequency Ordered Stop   01/21/24 2200  fluconazole (DIFLUCAN) tablet 100 mg        100 mg Oral Daily at bedtime 01/20/24 2043     01/20/24 2130  fluconazole (DIFLUCAN) tablet 200 mg        200 mg Oral  Once 01/20/24 2032 01/20/24 2242         Subjective: Patient seen and examined, daughter at the bedside.  Patient has no other complaint, mouth pain is improving but not resolved completely yet.  No other complaint.  Objective: Vitals:   01/20/24 2018 01/20/24 2023 01/21/24 0006 01/21/24 0405  BP:  106/66 102/68 110/70  Pulse:   76 73  Resp:  17 16 16   Temp:  97.8 F (36.6 C) 98.2 F (36.8 C) 97.7 F (36.5 C)  TempSrc:  Oral Axillary Axillary  SpO2:  92% 92% 94%  Weight: 37.2 kg     Height: 5' 5 (1.651 m)       Intake/Output Summary (Last 24 hours) at 01/21/2024 0856 Last data filed at 01/21/2024 0311 Gross per 24 hour  Intake 1716.65 ml  Output --  Net 1716.65 ml   Filed Weights   01/20/24 1215 01/20/24 2018  Weight: 35.8 kg 37.2  kg    Examination:  General exam: Appears calm and comfortable, has dried lips with dried blood around the lips.  Some white cheesy material on the tongue.  Appears cachectic. Respiratory system: Clear to auscultation. Respiratory effort normal. Cardiovascular system: S1 & S2 heard, RRR. No JVD, murmurs, rubs, gallops or clicks. No pedal edema. Gastrointestinal system: Abdomen is nondistended, soft and nontender. No organomegaly or masses felt. Normal bowel sounds heard. Central nervous system: Alert and oriented. No focal neurological deficits. Extremities: Symmetric 5 x 5 power. Skin: No rashes, lesions or ulcers Psychiatry: Judgement and insight appear normal. Mood & affect appropriate.    Data Reviewed: I have personally reviewed following labs and imaging studies  CBC: Recent Labs  Lab 01/20/24 1343 01/21/24 0459  WBC 17.1* 12.1*  NEUTROABS 13.1*  --  HGB 15.6* 13.2  HCT 44.2 37.8  MCV 89.5 92.6  PLT 237 203   Basic Metabolic Panel: Recent Labs  Lab 01/20/24 1343 01/21/24 0459  NA 133* 137  K 2.6* 3.4*  CL 86* 102  CO2 30 28  GLUCOSE 101* 117*  BUN 15 11  CREATININE 1.05* 0.86  CALCIUM 9.1 8.3*  MG 1.6*  --    GFR: Estimated Creatinine Clearance: 33.7 mL/min (by C-G formula based on SCr of 0.86 mg/dL). Liver Function Tests: Recent Labs  Lab 01/20/24 1343  AST 22  ALT 15  ALKPHOS 63  BILITOT 1.9*  PROT 7.0  ALBUMIN 3.5   No results for input(s): LIPASE, AMYLASE in the last 168 hours. No results for input(s): AMMONIA in the last 168 hours. Coagulation Profile: No results for input(s): INR, PROTIME in the last 168 hours. Cardiac Enzymes: No results for input(s): CKTOTAL, CKMB, CKMBINDEX, TROPONINI in the last 168 hours. BNP (last 3 results) No results for input(s): PROBNP in the last 8760 hours. HbA1C: Recent Labs    01/20/24 1343  HGBA1C 5.1   CBG: No results for input(s): GLUCAP in the last 168 hours. Lipid  Profile: No results for input(s): CHOL, HDL, LDLCALC, TRIG, CHOLHDL, LDLDIRECT in the last 72 hours. Thyroid  Function Tests: Recent Labs    01/20/24 1343  TSH 0.662   Anemia Panel: No results for input(s): VITAMINB12, FOLATE, FERRITIN, TIBC, IRON, RETICCTPCT in the last 72 hours. Sepsis Labs: No results for input(s): PROCALCITON, LATICACIDVEN in the last 168 hours.  Recent Results (from the past 240 hours)  Resp panel by RT-PCR (RSV, Flu A&B, Covid) Anterior Nasal Swab     Status: None   Collection Time: 01/20/24  1:07 PM   Specimen: Anterior Nasal Swab  Result Value Ref Range Status   SARS Coronavirus 2 by RT PCR NEGATIVE NEGATIVE Final    Comment: (NOTE) SARS-CoV-2 target nucleic acids are NOT DETECTED.  The SARS-CoV-2 RNA is generally detectable in upper respiratory specimens during the acute phase of infection. The lowest concentration of SARS-CoV-2 viral copies this assay can detect is 138 copies/mL. A negative result does not preclude SARS-Cov-2 infection and should not be used as the sole basis for treatment or other patient management decisions. A negative result may occur with  improper specimen collection/handling, submission of specimen other than nasopharyngeal swab, presence of viral mutation(s) within the areas targeted by this assay, and inadequate number of viral copies(<138 copies/mL). A negative result must be combined with clinical observations, patient history, and epidemiological information. The expected result is Negative.  Fact Sheet for Patients:  BloggerCourse.com  Fact Sheet for Healthcare Providers:  SeriousBroker.it  This test is no t yet approved or cleared by the [place]  FDA and  has been authorized for detection and/or diagnosis of SARS-CoV-2 by FDA under an Emergency Use Authorization (EUA). This EUA will remain  in effect (meaning this test can be used) for  the duration of the COVID-19 declaration under Section 564(b)(1) of the Act, 21 U.S.C.section 360bbb-3(b)(1), unless the authorization is terminated  or revoked sooner.       Influenza A by PCR NEGATIVE NEGATIVE Final   Influenza B by PCR NEGATIVE NEGATIVE Final    Comment: (NOTE) The Xpert Xpress SARS-CoV-2/FLU/RSV plus assay is intended as an aid in the diagnosis of influenza from Nasopharyngeal swab specimens and should not be used as a sole basis for treatment. Nasal washings and aspirates are unacceptable for Xpert Xpress SARS-CoV-2/FLU/RSV testing.  Fact Sheet  for Patients: BloggerCourse.com  Fact Sheet for Healthcare Providers: SeriousBroker.it  This test is not yet approved or cleared by the [place]  FDA and has been authorized for detection and/or diagnosis of SARS-CoV-2 by FDA under an Emergency Use Authorization (EUA). This EUA will remain in effect (meaning this test can be used) for the duration of the COVID-19 declaration under Section 564(b)(1) of the Act, 21 U.S.C. section 360bbb-3(b)(1), unless the authorization is terminated or revoked.     Resp Syncytial Virus by PCR NEGATIVE NEGATIVE Final    Comment: (NOTE) Fact Sheet for Patients: BloggerCourse.com  Fact Sheet for Healthcare Providers: SeriousBroker.it  This test is not yet approved or cleared by the [place]  FDA and has been authorized for detection and/or diagnosis of SARS-CoV-2 by FDA under an Emergency Use Authorization (EUA). This EUA will remain in effect (meaning this test can be used) for the duration of the COVID-19 declaration under Section 564(b)(1) of the Act, 21 U.S.C. section 360bbb-3(b)(1), unless the authorization is terminated or revoked.  Performed at [facility], [address] [place], [place] [address]      Radiology Studies: DG Chest 2 View Result Date:  01/20/2024 CLINICAL DATA:  Wheezing EXAM: CHEST - 2 VIEW COMPARISON:  X-ray 06/11/2022.  CT 1 2/25 FINDINGS: Hyperinflation. Chronic lung changes. No consolidation, pneumothorax or effusion. Normal cardiopericardial silhouette. No edema. Curvature of the spine with degenerative changes. Fixation hardware along the cervical spine at the edge of the imaging field. IMPRESSION: Hyperinflation with chronic changes. Electronically Signed   By: Ranell Bring M.D.   On: 01/20/2024 13:40    Scheduled Meds:  enoxaparin (LOVENOX) injection  30 mg Subcutaneous Q24H   fluconazole  100 mg Oral QHS   potassium chloride  40 mEq Oral Once   Continuous Infusions:  dextrose  5 % and 0.9 % NaCl with KCl 40 mEq/L 100 mL/hr at 01/21/24 0311     LOS: 0 days   Fredia Skeeter, MD [facility]  01/21/2024, 8:56 AM   *Please note that this is a verbal dictation therefore any spelling or grammatical errors are due to the Dragon Medical One system interpretation.  Please page via Amion and do not message via secure chat for urgent patient care matters. Secure chat can be used for non urgent patient care matters.  How to contact the [facility] Attending or Consulting provider 7A - 7P or covering provider during after hours 7P -7A, for this patient?  Check the care team in [facility] and look for a) attending/consulting [facility] provider listed and b) the [facility] team listed. Page or secure chat 7A-7P. Log into [email or web address] and use [facility]'s universal password to access. If you do not have the password, please contact the hospital operator. Locate the [facility] provider you are looking for under [facility] and page to a number that you can be directly reached. If you still have difficulty reaching the provider, please page the [facility] (Director on Call) for the Hospitalists listed on amion for assistance.

## 2024-01-21 NOTE — Consult Note (Signed)
 Gastroenterology Consult   Referring Provider: No ref. provider found Primary Care Physician:  Bertell Satterfield, MD Primary Gastroenterologist:  Muhammad Faizan Ahmed, MD (previously Dr. Harvey - 2015)   Patient ID: Brandi Bean; [id number]; 1951-01-17   Admit date: 01/20/2024  LOS: 0 days   Date of Consultation: 01/21/2024  Reason for Consultation:  dysphagia, odynophagia, weight loss, early satiety, oral thrush  History of Present Illness   Brandi Bean is a 73 y.o. year old female with medical history of COPD, HTN, HLD, stroke/TIA, fibromyalgia, anxiety/depression, arthritis, OSA, and tobacco abuse reported to the ED from PCP office with complaint of oral pain/swelling and thrush with associated poor p.o. intake, weight loss, and early satiety.   ED course: -Afebrile, initial O2 sats in the upper 80s however improved to the 90s with ambulation - WBC 17.1, hemoglobin 15.6 (likely secondary to dehydration), sodium 133, K2.6, creatinine 1.05, T. bili 1.9, GFR 56 - EKG with sinus rhythm and PACs - EDP noted exudate on patient's tongue and buccal mucosa as well as some diffuse wheezing without rales. - CXR with hyperinflation and cervical spine hardware present - Given 2 L of IV fluids - Given Magic mouthwash to help with pain  Hospitalist started fluconazole 200 mg x 1 and then 100 mg daily for 7 days.  Consult:  Ongoing tobacco use. Has not been having overt dysphagia.  [place] has been having a burning in her throat but this did not start until after the redness and white spots began on her tongue.  Daughters at bedside today report that her lips were swollen significantly within the last couple days but they have noticed an improvement since being here in the hospital.  Patient reports that she was also having burning more so to the outside of her lips due to some blisters therefore that is why she was unable to tolerate the nystatin swish and swallow.  Patient and family both  state that she has not had the best appetite for the last 2-3 years and just does not eat much on a regular basis.  Previously was having some issues with hard stools but has been taking a stool softener on a daily basis which is improving and she has not straining and having regular bowel movements.  She denies any significant abdominal pain, melena, or BRBPR.  Has had some occasional nausea with her decreased appetite and early satiety recently however denies any overt vomiting.  States she has some occasional heartburn/indigestion but not on a frequent basis.  Per review of notes, it appears that she was given nystatin swish and swallow by PCP but has been experiencing burning with its intake.  She reported she was not rinsing her mouth after using inhalers at home however does not appear that any of her inhalers contain steroids.  EP also reported blistering of her lower lip with angular cheilitis.  CT for lung screening in January 2025: - Medial left lower lobe bronchial wall thickening and ill-defined nodularity, right upper lobe nodule interest was similar in size of 6.1 mm and other solid pulmonary nodules are unchanged with largest measuring 4.8 mm - Mild left upper lobe and lingual mucoid impaction unchanged - Left adrenal nodule measuring 1.4 cm consistent with adenoma  Colonoscopy July 2015: - 6 mm tubular adenoma without dysplasia in sigmoid colon - Redundant sigmoid colon - Small internal hemorrhoids - Advised repeat colonoscopy in 5-10 years  Past Medical History:  Diagnosis Date   Anxiety  Arthritis    Back pain    COPD (chronic obstructive pulmonary disease) (HCC)    Depression    Dyspnea    W/ EXERTION+   WHEEZING   Fibromyalgia    Headache    Hypertension    PONV (postoperative nausea and vomiting)    Sleep apnea    ?   MILD NO MACHINE ORDERED, TO BE RETESTED AT HOME AFTER SURGERY   Stroke [facility])    MINI STROKE   15-20 YRS AGO   UTI (urinary tract infection)      Past Surgical History:  Procedure Laterality Date   ABDOMINAL HYSTERECTOMY     BLADDER TACK   ANTERIOR CERVICAL DECOMP/DISCECTOMY FUSION N/A 08/05/2016   Procedure: ANTERIOR CERVICAL DECOMPRESSION/DISCECTOMY FUSION  - CERVICAL THREE-FOUR, CERRVICAL FOUR-FIVE, CERVICAL FIVE-SIX;  Surgeon: Victory Gunnels, MD;  Location: [facility] OR;  Service: Neurosurgery;  Laterality: N/A;   CATARACT EXTRACTION W/PHACO Right 02/24/2017   Procedure: CATARACT EXTRACTION PHACO AND INTRAOCULAR LENS PLACEMENT (IOC);  Surgeon: Perley Hamilton, MD;  Location: [facility] ORS;  Service: Ophthalmology;  Laterality: Right;  CDE: 9.96   CATARACT EXTRACTION W/PHACO Left 03/09/2017   Procedure: CATARACT EXTRACTION PHACO AND INTRAOCULAR LENS PLACEMENT LEFT EYE;  Surgeon: Perley Hamilton, MD;  Location: [facility] ORS;  Service: Ophthalmology;  Laterality: Left;  CDE: 7.10   CHOLECYSTECTOMY     COLONOSCOPY N/A 02/14/2014   Procedure: COLONOSCOPY;  Surgeon: Margo LITTIE Haddock, MD;  Location: [facility] END[facility] SUITE;  Service: Endoscopy;  Laterality: N/A;  10:00 AM-moved to 1215 Anette Caldron to notify pt   HAND SURGERY     RIGHT   POLYPECTOMY  02/14/2014   Procedure: POLYPECTOMY;  Surgeon: Margo LITTIE Haddock, MD;  Location: [facility];  Service: Endoscopy;;  Sigmoid colon    Prior to Admission medications   Medication Sig Start Date End Date Taking? Authorizing Provider  albuterol  (PROVENTIL  HFA;VENTOLIN  HFA) 108 (90 BASE) MCG/ACT inhaler Inhale 2 puffs into the lungs every 6 (six) hours as needed for wheezing or shortness of breath.   Yes [provider]  atorvastatin (LIPITOR) 20 MG tablet Take 20 mg by mouth daily at 6 PM.   Yes [provider]  bisoprolol -hydrochlorothiazide  (ZIAC ) 10-6.25 MG per tablet Take 1 tablet by mouth every evening.   Yes [provider]  docusate sodium (COLACE) 50 MG capsule Take 50 mg by mouth daily as needed for mild constipation.   Yes [provider]  furosemide (LASIX) 20 MG tablet Take 20-40 mg by mouth  daily as needed for fluid or edema. 02/04/23  Yes [provider]  Glycopyrrolate -Formoterol  (BEVESPI AEROSPHERE) 9-4.8 MCG/ACT AERO Inhale 2 puffs into the lungs 2 (two) times daily.   Yes [provider]  mupirocin ointment (BACTROBAN) 2 % Apply 1 Application topically 2 (two) times daily. 01/19/24  Yes [provider]  nitrofurantoin  (MACRODANTIN ) 50 MG capsule Take 50 mg by mouth daily.   Yes [provider]  nystatin (MYCOSTATIN) 100000 UNIT/ML suspension Take 5 mLs by mouth 4 (four) times daily. 01/19/24  Yes [provider]  ondansetron  (ZOFRAN ) 4 MG tablet Take 4 mg by mouth 4 (four) times daily as needed for nausea or vomiting. 01/07/24  Yes [provider]  oxyCODONE  (ROXICODONE ) 15 MG immediate release tablet Take 15 mg by mouth every 4 (four) hours as needed for pain. 10/17/20  Yes [provider]    Current Facility-Administered Medications  Medication Dose Route Frequency Provider Last Rate Last Admin   acetaminophen  (TYLENOL ) tablet  650 mg  650 mg Oral Q6H PRN Emokpae, Ejiroghene E, MD       Or   acetaminophen  (TYLENOL ) suppository 650 mg  650 mg Rectal Q6H PRN Emokpae, Ejiroghene E, MD       dextrose  5 % and 0.9 % NaCl with KCl 40 mEq/L infusion   Intravenous Continuous Emokpae, Ejiroghene E, MD 100 mL/hr at 01/21/24 0311 Infusion Verify at 01/21/24 0311   enoxaparin (LOVENOX) injection 30 mg  30 mg Subcutaneous Q24H Emokpae, Ejiroghene E, MD       fluconazole (DIFLUCAN) tablet 100 mg  100 mg Oral QHS Emokpae, Ejiroghene E, MD       ipratropium-albuterol  (DUONEB) 0.5-2.5 (3) MG/3ML nebulizer solution 3 mL  3 mL Nebulization Q4H PRN Emokpae, Ejiroghene E, MD       magic mouthwash w/lidocaine   10 mL Oral TID PRN Emokpae, Ejiroghene E, MD   10 mL at 01/21/24 0001   ondansetron  (ZOFRAN ) tablet 4 mg  4 mg Oral Q6H PRN Emokpae, Ejiroghene E, MD       Or   ondansetron  (ZOFRAN ) injection 4 mg  4 mg Intravenous Q6H PRN Emokpae,  Ejiroghene E, MD       oxyCODONE  (Oxy IR/ROXICODONE ) immediate release tablet 15 mg  15 mg Oral Q6H PRN Emokpae, Ejiroghene E, MD   15 mg at 01/21/24 0403   phenol (CHLORASEPTIC) mouth spray 1 spray  1 spray Mouth/Throat PRN Adefeso, Oladapo, DO   1 spray at 01/21/24 0251   polyethylene glycol (MIRALAX / GLYCOLAX) packet 17 g  17 g Oral Daily PRN Emokpae, Ejiroghene E, MD        Allergies as of 01/20/2024 - Review Complete 01/20/2024  Allergen Reaction Noted   Doxycycline  Other (See Comments) 11/27/2020   Hydrocodone  Other (See Comments) 07/30/2016   Prednisone  Other (See Comments) 06/25/2007   Codeine sulfate Itching 06/25/2007   Latex Itching 02/16/2017    History reviewed. No pertinent family history.  Social History   Socioeconomic History   Marital status: Married    Spouse name: Not on file   Number of children: Not on file   Years of education: Not on file   Highest education level: Not on file  Occupational History   Not on file  Tobacco Use   Smoking status: Every Day    Current packs/day: 1.00    Average packs/day: 1 pack/day for 33.5 years (33.5 ttl pk-yrs)    Types: Cigarettes    Start date: 1992    Passive exposure: Current   Smokeless tobacco: Never   Tobacco comments:    A little over a half pack a day- 02/05/22 [facility]  Vaping Use   Vaping status: Former  Substance and Sexual Activity   Alcohol use: No    Alcohol/week: 0.0 standard drinks of alcohol   Drug use: No   Sexual activity: Not Currently  Other Topics Concern   Not on file  Social History Narrative   Not on file   Social Drivers of Health   Financial Resource Strain: Not on file  Food Insecurity: No Food Insecurity (01/20/2024)   Hunger Vital Sign    Worried About Running Out of Food in the Last Year: Never true    Ran Out of Food in the Last Year: Never true  Transportation Needs: No Transportation Needs (01/20/2024)   PRAPARE - Administrator, Civil Service (Medical): No     Lack of Transportation (Non-Medical): No  Physical Activity: Not on file  Stress: Not on file  Social Connections: Moderately Isolated (01/20/2024)   Social Connection and Isolation Panel    Frequency of Communication with Friends and Family: More than three times a week    Frequency of Social Gatherings with Friends and Family: More than three times a week    Attends Religious Services: Never    Database administrator or Organizations: No    Attends Banker Meetings: Never    Marital Status: Married  Catering manager Violence: Not At Risk (01/20/2024)   Humiliation, Afraid, Rape, and Kick questionnaire    Fear of Current or Ex-Partner: No    Emotionally Abused: No    Physically Abused: No    Sexually Abused: No     Review of Systems   Gen: + fatigue, lack of appetite, weight loss. Denies any fever, chills. CV: Denies chest pain, heart palpitations, syncope, edema  Resp: + DOE and at rest/ + wheezing.  Denies shortness of breath with rest, cough,coughing up blood, and pleurisy. GI: see HPI GU : Denies urinary burning, blood in urine, urinary frequency, and urinary incontinence. Derm: + dry skin. Denies rash, itching, hives. Psych: Denies depression, anxiety, memory loss, hallucinations, and confusion. Heme: Denies bruising or bleeding Neuro:  Denies any headaches, dizziness, paresthesias, shaking  Physical Exam   Vital Signs in last 24 hours: Temp:  [97.5 F (36.4 C)-98.7 F (37.1 C)] 97.7 F (36.5 C) (06/26 0405) Pulse Rate:  [73-96] 73 (06/26 0405) Resp:  [14-36] 16 (06/26 0405) BP: (92-125)/(49-75) 110/70 (06/26 0405) SpO2:  [86 %-99 %] 94 % (06/26 0405) Weight:  [35.8 kg-37.2 kg] 37.2 kg (06/25 2018) Last BM Date : 01/18/24  General:   Alert, thin, chronically ill-appearing. pleasant and cooperative Head:  Normocephalic and atraumatic. Eyes:  Sclera clear, no icterus.   Conjunctiva pink. Ears:  Normal auditory acuity. Mouth: Dry buccal mucosa.   Scabbing to lips.  Erythema to tongue and oropharyngeal mucosa Neck:  Supple; no masses Lungs: Inspiratory and expiratory wheezing.  2 L O2 via nasal cannula Heart:  Regular rate and rhythm; no murmurs, clicks, rubs,  or gallops. Abdomen:  Soft, nontender and nondistended. No masses, hepatosplenomegaly or hernias noted. Normal bowel sounds, without guarding, and without rebound.   Rectal: deferred Msk: Poor muscle tone Neurologic:  Alert and  oriented x4. Skin:  Intact without significant lesions or rashes.  Thin. Psych:  Alert and cooperative. Normal mood and affect.  Intake/Output from previous day: 06/25 0701 - 06/26 0700 In: 1716.7 [I.V.:429; IV Piggyback:1287.6] Out: -  Intake/Output this shift: Total I/O In: 622.3 [I.V.:429; IV Piggyback:193.3] Out: -   Wt Readings from Last 10 Encounters:  01/20/24 37.2 kg  11/17/23 38.7 kg  08/12/23 41.7 kg  02/12/23 42.1 kg  02/05/22 44.3 kg  07/10/21 43.5 kg  07/03/21 44.5 kg  04/25/21 43.9 kg  11/26/20 45 kg  10/01/20 45.4 kg   Labs/Studies   Recent Labs Recent Labs    01/20/24 1343 01/21/24 0459  WBC 17.1* 12.1*  HGB 15.6* 13.2  HCT 44.2 37.8  PLT 237 203   BMET Recent Labs    01/20/24 1343 01/21/24 0459  NA 133* 137  K 2.6* 3.4*  CL 86* 102  CO2 30 28  GLUCOSE 101* 117*  BUN 15 11  CREATININE 1.05* 0.86  CALCIUM 9.1 8.3*   LFT Recent Labs    01/20/24 1343  PROT 7.0  ALBUMIN 3.5  AST 22  ALT 15  ALKPHOS 63  BILITOT  1.9*   PT/INR No results for input(s): LABPROT, INR in the last 72 hours. Hepatitis Panel No results for input(s): HEPBSAG, HCVAB, HEPAIGM, HEPBIGM in the last 72 hours. C-Diff No results for input(s): CDIFFTOX in the last 72 hours.  Radiology/Studies DG Chest 2 View Result Date: 01/20/2024 CLINICAL DATA:  Wheezing EXAM: CHEST - 2 VIEW COMPARISON:  X-ray 06/11/2022.  CT 1 2/25 FINDINGS: Hyperinflation. Chronic lung changes. No consolidation, pneumothorax or effusion.  Normal cardiopericardial silhouette. No edema. Curvature of the spine with degenerative changes. Fixation hardware along the cervical spine at the edge of the imaging field. IMPRESSION: Hyperinflation with chronic changes. Electronically Signed   By: Ranell Bring M.D.   On: 01/20/2024 13:40    Assessment   Brandi Bean is a 73 y.o. year old female history of COPD, HTN, HLD, stroke/TIA, fibromyalgia, anxiety/depression, arthritis, OSA, and tobacco abuse reported to the ED from PCP office with complaint of oral pain/swelling and thrush.  GI consulted for further evaluation of poor p.o. intake, weight loss, and odynophagia.  Oropharyngeal candidiasis, odynophagia:  - Symptoms started within the last few weeks, has been trialing multiple different inhalers and not doing any mouth rinsing - It is likely that she could have some progression of Candida into her esophagus given some mild upper throat burning and pain with swallowing however does not have any overt dysphagia. - Started on fluconazole yesterday in the ED, already seeing improvement with her mouth with this along with Magic mouthwash - Lips remain significantly dry however her lips are not swollen and tongue does not very swollen today on exam.  Mucous membranes do appear somewhat erythematous and dry. - Encouraged Vaseline/lip balm to her lips frequently given the cracking and scabbing - Given no overt dysphagia today on examination and she is already receiving fluconazole treatment, will monitor for improvement of symptoms and we discussed outpatient upper endoscopy to be done concurrently with colonoscopy given she is due for surveillance.  Daughters and patient both okay with this plan.  We discussed that it would take time for this to occur and that we will set her up with an outpatient follow-up after discharge to discuss scheduling both EGD and colonoscopy and reassess her odynophagia, lack of appetite, weight loss.  Poor p.o. intake,  unintentional weight loss: - Per review of weights, she has lost 4-5 kg of weight since July of last year - Daughter has previously reported that she lost about 70 pounds within the last 2 years although per our system weights, she is only lost about 8 kg since March 2022 (100lb >>82lb) - CT for lung screening in January 2025 there was medial left lower lobe bronchial wall thickening and ill-defined nodularity, right upper lobe nodule interest was similar in size of 6.1 mm and other solid pulmonary nodules are unchanged with largest measuring 4.8 mm.  Mild left upper lobe and lingual mucoid impaction unchanged.  Also with left adrenal nodule measuring 1.4 cm consistent with adenoma -Currently without any melena, BRBPR, or abdominal pain.  Does have lack of appetite and weight loss as noted above but has been gradual and not sudden. - Last colonoscopy in 2015 with singular tubular adenoma removed.  Currently due for surveillance. - Has never had EGD.  We discussed that she should continue oral intake is much as tolerated and that regarding her weight loss we should consider outpatient EGD and colonoscopy as per above.  Leukocytosis: - Unclear if secondary to oral candidiasis or possible COPD exacerbation -  Afebrile - WBC 17.1 on admission, improved to 12.1 today  Hypokalemia:  -Potassium 2.6 on admission. Improved to 3.4 today. -Likely secondary to poor p.o. intake.   -Replacement per hospitalist.  Plan / Recommendations   Okay to continue Magic mouthwash as needed Antiemetics as needed Agree with fluconazole treatment for 7 days Outpatient EGD and colonoscopy for further evaluation of poor p.o. intake, weight loss, odynophagia, and oropharyngeal candidiasis.  If she has ongoing evidence of candidiasis extending into the esophagus on EGD then we will likely retreat with fluconazole Smoking cessation High-protein diet Follow-up 1-2 weeks post discharge with GI.   Will follow up with the  patient tomorrow and if still improving will sign off and perform outpatient evaluation.      01/21/2024, 6:29 AM  Charmaine Melia, MSN, FNP-BC, AGACNP-BC [facility]

## 2024-01-21 NOTE — Progress Notes (Signed)
   01/21/24 1026  TOC Brief Assessment  Insurance and Status Reviewed  Patient has primary care physician Yes  Home environment has been reviewed Home  Prior level of function: With Spouse assistance as needed  Prior/Current Home Services No current home services  Social Drivers of Health Review SDOH reviewed no interventions necessary  Readmission risk has been reviewed Yes  Transition of care needs no transition of care needs at this time   Transition of Care Department [facility]) has reviewed patient and no TOC needs have been identified at this time. We will continue to monitor patient advancement through interdisciplinary progression rounds. If new patient transition needs arise, please place a TOC consult.

## 2024-01-21 NOTE — Plan of Care (Signed)
  Problem: Health Behavior/Discharge Planning: Goal: Ability to manage health-related needs will improve Outcome: Progressing   Problem: Activity: Goal: Risk for activity intolerance will decrease Outcome: Progressing   Problem: Nutrition: Goal: Adequate nutrition will be maintained Outcome: Progressing   Problem: Coping: Goal: Level of anxiety will decrease Outcome: Progressing   Problem: Elimination: Goal: Will not experience complications related to urinary retention Outcome: Progressing   Problem: Pain Managment: Goal: General experience of comfort will improve and/or be controlled Outcome: Progressing   Problem: Skin Integrity: Goal: Risk for impaired skin integrity will decrease Outcome: Progressing

## 2024-01-21 NOTE — Plan of Care (Signed)
  Problem: Education: Goal: Knowledge of General Education information will improve Description: Including pain rating scale, medication(s)/side effects and non-pharmacologic comfort measures Outcome: Progressing   Problem: Clinical Measurements: Goal: Ability to maintain clinical measurements within normal limits will improve Outcome: Progressing   Problem: Pain Managment: Goal: General experience of comfort will improve and/or be controlled Outcome: Progressing   Problem: Skin Integrity: Goal: Risk for impaired skin integrity will decrease Outcome: Progressing

## 2024-01-22 ENCOUNTER — Other Ambulatory Visit (HOSPITAL_COMMUNITY): Payer: Self-pay

## 2024-01-22 ENCOUNTER — Telehealth: Payer: Self-pay | Admitting: Gastroenterology

## 2024-01-22 DIAGNOSIS — R918 Other nonspecific abnormal finding of lung field: Secondary | ICD-10-CM | POA: Diagnosis not present

## 2024-01-22 DIAGNOSIS — E43 Unspecified severe protein-calorie malnutrition: Secondary | ICD-10-CM | POA: Insufficient documentation

## 2024-01-22 DIAGNOSIS — E876 Hypokalemia: Secondary | ICD-10-CM | POA: Diagnosis not present

## 2024-01-22 DIAGNOSIS — B370 Candidal stomatitis: Secondary | ICD-10-CM | POA: Diagnosis not present

## 2024-01-22 DIAGNOSIS — R634 Abnormal weight loss: Secondary | ICD-10-CM | POA: Diagnosis not present

## 2024-01-22 LAB — BASIC METABOLIC PANEL WITH GFR
Anion gap: 6 (ref 5–15)
BUN: 8 mg/dL (ref 8–23)
CO2: 28 mmol/L (ref 22–32)
Calcium: 9 mg/dL (ref 8.9–10.3)
Chloride: 107 mmol/L (ref 98–111)
Creatinine, Ser: 0.83 mg/dL (ref 0.44–1.00)
GFR, Estimated: >60 mL/min (ref >60)
Glucose, Bld: 89 mg/dL (ref 70–99)
Potassium: 4.9 mmol/L (ref 3.5–5.1)
Sodium: 141 mmol/L (ref 135–145)

## 2024-01-22 LAB — CBC WITH DIFFERENTIAL/PLATELET
Abs Immature Granulocytes: 0.04 10*3/uL (ref 0.00–0.07)
Basophils Absolute: 0.1 10*3/uL (ref 0.0–0.1)
Basophils Relative: 1 %
Eosinophils Absolute: 0.1 10*3/uL (ref 0.0–0.5)
Eosinophils Relative: 1 %
HCT: 42.9 % (ref 36.0–46.0)
Hemoglobin: 14.3 g/dL (ref 12.0–15.0)
Immature Granulocytes: 0 %
Lymphocytes Relative: 31 %
Lymphs Abs: 3.7 10*3/uL (ref 0.7–4.0)
MCH: 32.2 pg (ref 26.0–34.0)
MCHC: 33.3 g/dL (ref 30.0–36.0)
MCV: 96.6 fL (ref 80.0–100.0)
Monocytes Absolute: 0.9 10*3/uL (ref 0.1–1.0)
Monocytes Relative: 8 %
Neutro Abs: 7.1 10*3/uL (ref 1.7–7.7)
Neutrophils Relative %: 59 %
Platelets: 230 10*3/uL (ref 150–400)
RBC: 4.44 MIL/uL (ref 3.87–5.11)
RDW: 13.2 % (ref 11.5–15.5)
WBC: 11.9 10*3/uL — ABNORMAL HIGH (ref 4.0–10.5)
nRBC: 0 % (ref 0.0–0.2)

## 2024-01-22 MED ORDER — FLUCONAZOLE 100 MG PO TABS: 100.0000 mg | ORAL_TABLET | Freq: Every day | ORAL | 0 refills | Status: DC

## 2024-01-22 MED ORDER — MAGIC MOUTHWASH W/LIDOCAINE: 10.0000 mL | Freq: Three times a day (TID) | ORAL | 0 refills | Status: DC | PRN

## 2024-01-22 NOTE — Progress Notes (Signed)
  Patient Saturations on O2 @ 2lpm [place] at Rest = 96%  Patient Saturations on Room Air at Rest = 93%  Patient Saturations on Room Air while Ambulating = 95%  Pt ambulated > 500 ft with at normal gait with no increased SOB or dyspnea. Tolerated well.

## 2024-01-22 NOTE — Telephone Encounter (Signed)
 Please arrange hospital follow-up in 3-4 weeks with myself or Dr. Cinderella to discuss scheduling EGD and colonoscopy.  Charmaine Melia, MSN, APRN, FNP-BC, AGACNP-BC [facility]

## 2024-01-22 NOTE — Progress Notes (Signed)
 Gastroenterology Progress Note   Referring Provider: No ref. provider found Primary Care Physician:  Bertell Satterfield, MD Primary Gastroenterologist: Muhammad Faizan Ahmed, MD  Patient ID: Brandi Bean; [id number]; 11/16/1950    Subjective   Feeling well overall this morning.  Still having some burning to her tip of her tongue and her lips.  Overall swallowing and mouth pain is improved.  Likes the Magic mouthwash, feels like the fluconazole  is helping with her symptoms.  No throat burning.  Tolerating full liquids   Objective   Vital signs in last 24 hours Temp:  [98.4 F (36.9 C)-99.1 F (37.3 C)] 98.4 F (36.9 C) (06/27 0500) Pulse Rate:  [77-81] 80 (06/27 0500) Resp:  [17] 17 (06/26 1954) BP: (102-106)/(66-79) 106/79 (06/27 0500) SpO2:  [92 %-97 %] 95 % (06/27 1029) Last BM Date : 09/23/23  Physical Exam General:   Alert and oriented, pleasant Head:  Normocephalic and atraumatic. Eyes:  No icterus, sclera clear. Conjuctiva pink.  Mouth: Erythematous tongue, dry/cracking and erythematous lips, Neck:  Supple, without thyromegaly or masses.  Neurologic:  Alert and  oriented x4;  grossly normal neurologically. Skin:  Warm and dry, intact without significant lesions.  Psych:  Alert and cooperative. Normal mood and affect.  Intake/Output from previous day: No intake/output data recorded. Intake/Output this shift: Total I/O In: 240 [P.O.:240] Out: -   Lab Results  Recent Labs    01/20/24 1343 01/21/24 0459 01/22/24 0504  WBC 17.1* 12.1* 11.9*  HGB 15.6* 13.2 14.3  HCT 44.2 37.8 42.9  PLT 237 203 230   BMET Recent Labs    01/20/24 1343 01/21/24 0459 01/22/24 0504  NA 133* 137 141  K 2.6* 3.4* 4.9  CL 86* 102 107  CO2 30 28 28   GLUCOSE 101* 117* 89  BUN 15 11 8   CREATININE 1.05* 0.86 0.83  CALCIUM  9.1 8.3* 9.0   LFT Recent Labs    01/20/24 1343  PROT 7.0  ALBUMIN 3.5  AST 22  ALT 15  ALKPHOS 63  BILITOT 1.9*   PT/INR No results for  input(s): LABPROT, INR in the last 72 hours. Hepatitis Panel No results for input(s): HEPBSAG, HCVAB, HEPAIGM, HEPBIGM in the last 72 hours.  Studies/Results DG Chest 2 View Result Date: 01/20/2024 CLINICAL DATA:  Wheezing EXAM: CHEST - 2 VIEW COMPARISON:  X-ray 06/11/2022.  CT 1 2/25 FINDINGS: Hyperinflation. Chronic lung changes. No consolidation, pneumothorax or effusion. Normal cardiopericardial silhouette. No edema. Curvature of the spine with degenerative changes. Fixation hardware along the cervical spine at the edge of the imaging field. IMPRESSION: Hyperinflation with chronic changes. Electronically Signed   By: Ranell Bring M.D.   On: 01/20/2024 13:40    Assessment  73 y.o. female with a history of stroke/TIA, COPD, HTN, HLD, lung nodules, anxiety/depression, fibromyalgia, arthritis, OSA, tobacco abuse who reported to the ED from her PCP office with complaint of oral pain/swelling and thrush unrelieved with outpatient nystatin swish and swallow.  GI consulted for further evaluation of poor p.o. intake, weight loss, and odynophagia.  Oropharyngeal candidiasis, odynophagia: - Symptoms started within the last few weeks, had been trialing different inhalers without post and dilation mouth rinsing. - Odynophagia likely secondary to some possible progression of Candida into her esophagus given mild upper throat burning and pain with swallowing however denied any overt dysphagia - Started on fluconazole  in the ED and continues to see improvement with this in conjunction with Magic mouthwash. - Lips and tongue remain reddened however tongue  and lips much less swollen than previously.  Able to tolerate diet better. - Held off on inpatient endoscopic examination given no overt dysphagia. - Encouraged Vaseline/lip balm to her lips to help with the cracking, with skin healing suspect burning sensation should improve. - Agree with continuing fluconazole , decision made to continue 21 days  total of therapy.  May use Magic mouthwash as needed at home.  Poor p.o. intake, unintentional weight loss: - Total 4-5 kg weight loss since July of last year, previously reported about a 70 to 70 pound weight loss within the last 2 years per daughters however our system indicates about 18 pound weight loss since March 2022. - CT lung cancer screening in January 2025 with medial left lower lobe bronchial wall thickening and ill-defined nodularity, right upper lobe nodule similar in size to previous and other solid pulmonary nodules unchanged with largest measuring 4.8 mm.  Also mild left upper lobe and lingual mucoid impaction unchanged.  Left adrenal nodule measuring 1.4 cm consistent with adenoma. - No complaints of melena, BRBPR, or abdominal pain.  Has had decreased appetite for many years as well.  She states this is not a significant change. - Last colonoscopy in 2015 with tubular adenoma removed, currently overdue for surveillance - No prior EGD.  We discussed previously need for outpatient EGD and colonoscopy, she is in agreements.  Likely would not tolerate prep until after oropharyngeal healing.  Advised we will see her in 3-4 weeks after she completes fluconazole  treatment and discuss scheduling outpatient EGD and colonoscopy.   Plan / Recommendations  Continue Magic mouthwash as needed Fluconazole , complete 21 days of treatment Smoking cessation Good oral hygiene post inhaler use. High-protein diet with at least 1 supplement daily. Follow-up office visit 3-4 weeks, our office will arrange.    LOS: 0 days    01/22/2024, 1:48 PM   Charmaine Melia, MSN, FNP-BC, AGACNP-BC [facility]

## 2024-01-22 NOTE — Progress Notes (Signed)
 Mobility Specialist Progress Note:    01/22/24 1050  Mobility  Activity Ambulated with assistance in hallway  Level of Assistance Standby assist, set-up cues, supervision of patient - no hands on  Assistive Device None  Distance Ambulated (ft) 180 ft  Range of Motion/Exercises Active;All extremities  Activity Response Tolerated well  Mobility Referral Yes  Mobility visit 1 Mobility  Mobility Specialist Start Time (ACUTE ONLY) 1050  Mobility Specialist Stop Time (ACUTE ONLY) 1110  Mobility Specialist Time Calculation (min) (ACUTE ONLY) 20 min   Pt received in bed, family in room. Agreeable to mobility, required supervision to stand and ambulate with no AD. Tolerated well, SpO2 94% on RA EOS. Returned pt supine, all needs met.  Darnelle Derrick Mobility Specialist Please contact via Special educational needs teacher or  Rehab office at [phone]

## 2024-01-22 NOTE — Hospital Course (Signed)
 73 year old female W/PMH of COPD not on home oxygen, HTN, stroke, tobacco use who presented to ED with complaints of pain in her mouth and swelling of the tongue for about a week duration.  Patient was found to have several blisters which were very painful and was not able to eat and drink.  Patient was admitted with a diagnosis of severe hypokalemia oropharyngeal candidiasis and COPD exacerbation.  Her potassium was 2.6 due to poor oral intake which was replaced and remained stable.  She also was treated for oropharyngeal candidiasis with nystatin swish and swallow.  She started feeling better.  She will continue fluconazole  for 7 more days.  She had unintentional weight loss over 2 years.  She has been discussed about discussing that with the primary care provider.  Initially her oxygenation was low around 86% now she is normal after treatment with the DuoNebs.  No need for steroid.  She has a history of hypertension which has been stable.  Leukocytosis improved.  She has severe protein calorie malnutrition, extensive counseling was done and dietitian consulted.  Patient was recently using steroid inhaler and was not arranging her mouth after that.  That might have caused her to have oropharyngeal candidiasis and thrush.  That was discussed in detail with the patient and patient's family they were verbalized understand the instruction and agree with the plan.  Patient will be discharged home on fluconazole  for 7 more days and Magic mouthwash.  Patient was able to eat and drink well today.  She will be discharged home in a stable condition.

## 2024-01-22 NOTE — Discharge Summary (Signed)
 Physician Discharge Summary   Patient: Brandi Bean MRN: [id number] DOB: 02-07-1951  Admit date:     01/20/2024  Discharge date: 01/22/24  Discharge Physician: Nena Rebel   PCP: Bertell Satterfield, MD   Recommendations at discharge:    Smoking cessation. Rinse mouth after steroid inhalation  Discharge Diagnoses: Principal Problem:   Hypokalemia Active Problems:   Oropharyngeal candidiasis   HYPERTENSION   COPD with chronic bronchitis and emphysema (HCC)   Weight loss   Protein-calorie malnutrition, severe  Resolved Problems:   * No resolved hospital problems. *   Hospital Course: 73 year old female W/PMH of COPD not on home oxygen, HTN, stroke, tobacco use who presented to ED with complaints of pain in her mouth and swelling of the tongue for about a week duration.  Patient was found to have several blisters which were very painful and was not able to eat and drink.  Patient was admitted with a diagnosis of severe hypokalemia oropharyngeal candidiasis and COPD exacerbation.  Her potassium was 2.6 due to poor oral intake which was replaced and remained stable.  She also was treated for oropharyngeal candidiasis with nystatin swish and swallow.  She started feeling better.  She will continue fluconazole  for 7 more days.  She had unintentional weight loss over 2 years.  She has been discussed about discussing that with the primary care provider.  Initially her oxygenation was low around 86% now she is normal after treatment with the DuoNebs.  No need for steroid.  She has a history of hypertension which has been stable.  Leukocytosis improved.  She has severe protein calorie malnutrition, extensive counseling was done and dietitian consulted.  Patient was recently using steroid inhaler and was not arranging her mouth after that.  That might have caused her to have oropharyngeal candidiasis and thrush.  That was discussed in detail with the patient and patient's family they were verbalized  understand the instruction and agree with the plan.  Patient will be discharged home on fluconazole  for 7 more days and Magic mouthwash.  Patient was able to eat and drink well today.  She will be discharged home in a stable condition.  Assessment and Plan: Hypokalemia Potassium 2.6 on admission.  Likely from poor oral intake. - Replaced and resolved  Oropharyngeal candidiasis Treated with Diflucan , nystatin swish and swallow and Magic mouthwash improved.  Weight loss Need to follow-up with primary care provider as outpatient.   COPD with chronic bronchitis and emphysema (HCC) Improved on room air now, continue home inhalers.  HYPERTENSION Blood pressure systolic 92-125. - Resume home medication        Consultants: None Procedures performed: N/A  Disposition: Home Diet recommendation:  Discharge Diet Orders (From admission, onward)     Start     Ordered   01/22/24 0000  Diet - low sodium heart healthy       Comments: Regular   01/22/24 1224           Regular diet  DISCHARGE MEDICATION: Allergies as of 01/22/2024       Reactions   Doxycycline  Other (See Comments)   Patient got burning and itching above eyes   Hydrocodone  Other (See Comments)   Passes out    Prednisone  Other (See Comments)   Unknown    Codeine Sulfate Itching   Latex Itching        Medication List     TAKE these medications    albuterol  108 (90 Base) MCG/ACT inhaler Commonly known as: VENTOLIN   HFA Inhale 2 puffs into the lungs every 6 (six) hours as needed for wheezing or shortness of breath.   atorvastatin  20 MG tablet Commonly known as: LIPITOR Take 20 mg by mouth daily at 6 PM.   Bevespi Aerosphere 9-4.8 MCG/ACT Aero Generic drug: Glycopyrrolate -Formoterol  Inhale 2 puffs into the lungs 2 (two) times daily.   bisoprolol -hydrochlorothiazide  10-6.25 MG tablet Commonly known as: ZIAC  Take 1 tablet by mouth every evening.   docusate sodium 50 MG capsule Commonly known as:  COLACE Take 50 mg by mouth daily as needed for mild constipation.   fluconazole  100 MG tablet Commonly known as: DIFLUCAN  Take 1 tablet (100 mg total) by mouth at bedtime.   furosemide 20 MG tablet Commonly known as: LASIX Take 20-40 mg by mouth daily as needed for fluid or edema.   magic mouthwash w/lidocaine  Soln Take 10 mLs by mouth 3 (three) times daily as needed for mouth pain. Suspension contains equal amounts of Maalox Extra Strength, nystatin, diphenhydramine  and lidocaine .   mupirocin ointment 2 % Commonly known as: BACTROBAN Apply 1 Application topically 2 (two) times daily.   nitrofurantoin  50 MG capsule Commonly known as: MACRODANTIN  Take 50 mg by mouth daily.   nystatin 100000 UNIT/ML suspension Commonly known as: MYCOSTATIN Take 5 mLs by mouth 4 (four) times daily.   ondansetron  4 MG tablet Commonly known as: ZOFRAN  Take 4 mg by mouth 4 (four) times daily as needed for nausea or vomiting.   oxyCODONE  15 MG immediate release tablet Commonly known as: ROXICODONE  Take 15 mg by mouth every 4 (four) hours as needed for pain.         Discharge Exam: Filed Weights   01/20/24 1215 01/20/24 2018  Weight: 35.8 kg 37.2 kg   Mouth: oral mucosa appear healing, tongue redness improved. Lungs clear to auscultation  Condition at discharge: stable  The results of significant diagnostics from this hospitalization (including imaging, microbiology, ancillary and laboratory) are listed below for reference.   Imaging Studies: DG Chest 2 View Result Date: 01/20/2024 CLINICAL DATA:  Wheezing EXAM: CHEST - 2 VIEW COMPARISON:  X-ray 06/11/2022.  CT 1 2/25 FINDINGS: Hyperinflation. Chronic lung changes. No consolidation, pneumothorax or effusion. Normal cardiopericardial silhouette. No edema. Curvature of the spine with degenerative changes. Fixation hardware along the cervical spine at the edge of the imaging field. IMPRESSION: Hyperinflation with chronic changes.  Electronically Signed   By: Ranell Bring M.D.   On: 01/20/2024 13:40    Microbiology: Results for orders placed or performed during the hospital encounter of 01/20/24  Resp panel by RT-PCR (RSV, Flu A&B, Covid) Anterior Nasal Swab     Status: None   Collection Time: 01/20/24  1:07 PM   Specimen: Anterior Nasal Swab  Result Value Ref Range Status   SARS Coronavirus 2 by RT PCR NEGATIVE NEGATIVE Final    Comment: (NOTE) SARS-CoV-2 target nucleic acids are NOT DETECTED.  The SARS-CoV-2 RNA is generally detectable in upper respiratory specimens during the acute phase of infection. The lowest concentration of SARS-CoV-2 viral copies this assay can detect is 138 copies/mL. A negative result does not preclude SARS-Cov-2 infection and should not be used as the sole basis for treatment or other patient management decisions. A negative result may occur with  improper specimen collection/handling, submission of specimen other than nasopharyngeal swab, presence of viral mutation(s) within the areas targeted by this assay, and inadequate number of viral copies(<138 copies/mL). A negative result must be combined with clinical observations, patient history, and  epidemiological information. The expected result is Negative.  Fact Sheet for Patients:  BloggerCourse.com  Fact Sheet for Healthcare Providers:  SeriousBroker.it  This test is no t yet approved or cleared by the [place]  FDA and  has been authorized for detection and/or diagnosis of SARS-CoV-2 by FDA under an Emergency Use Authorization (EUA). This EUA will remain  in effect (meaning this test can be used) for the duration of the COVID-19 declaration under Section 564(b)(1) of the Act, 21 U.S.C.section 360bbb-3(b)(1), unless the authorization is terminated  or revoked sooner.       Influenza A by PCR NEGATIVE NEGATIVE Final   Influenza B by PCR NEGATIVE NEGATIVE Final     Comment: (NOTE) The Xpert Xpress SARS-CoV-2/FLU/RSV plus assay is intended as an aid in the diagnosis of influenza from Nasopharyngeal swab specimens and should not be used as a sole basis for treatment. Nasal washings and aspirates are unacceptable for Xpert Xpress SARS-CoV-2/FLU/RSV testing.  Fact Sheet for Patients: BloggerCourse.com  Fact Sheet for Healthcare Providers: SeriousBroker.it  This test is not yet approved or cleared by the [place]  FDA and has been authorized for detection and/or diagnosis of SARS-CoV-2 by FDA under an Emergency Use Authorization (EUA). This EUA will remain in effect (meaning this test can be used) for the duration of the COVID-19 declaration under Section 564(b)(1) of the Act, 21 U.S.C. section 360bbb-3(b)(1), unless the authorization is terminated or revoked.     Resp Syncytial Virus by PCR NEGATIVE NEGATIVE Final    Comment: (NOTE) Fact Sheet for Patients: BloggerCourse.com  Fact Sheet for Healthcare Providers: SeriousBroker.it  This test is not yet approved or cleared by the [place]  FDA and has been authorized for detection and/or diagnosis of SARS-CoV-2 by FDA under an Emergency Use Authorization (EUA). This EUA will remain in effect (meaning this test can be used) for the duration of the COVID-19 declaration under Section 564(b)(1) of the Act, 21 U.S.C. section 360bbb-3(b)(1), unless the authorization is terminated or revoked.  Performed at [facility], [address], [place], [place] [address]     Labs: CBC: Recent Labs  Lab 01/20/24 1343 01/21/24 0459 01/22/24 0504  WBC 17.1* 12.1* 11.9*  NEUTROABS 13.1*  --  7.1  HGB 15.6* 13.2 14.3  HCT 44.2 37.8 42.9  MCV 89.5 92.6 96.6  PLT 237 203 230   Basic Metabolic Panel: Recent Labs  Lab 01/20/24 1343 01/21/24 0459 01/22/24 0504  NA 133* 137 141  K 2.6* 3.4* 4.9   CL 86* 102 107  CO2 30 28 28   GLUCOSE 101* 117* 89  BUN 15 11 8   CREATININE 1.05* 0.86 0.83  CALCIUM  9.1 8.3* 9.0  MG 1.6* 1.8  --    Liver Function Tests: Recent Labs  Lab 01/20/24 1343  AST 22  ALT 15  ALKPHOS 63  BILITOT 1.9*  PROT 7.0  ALBUMIN 3.5   CBG: No results for input(s): GLUCAP in the last 168 hours.  Discharge time spent: greater than 30 minutes.  Signed: Nena Rebel, MD [facility] 01/22/2024

## 2024-01-25 DIAGNOSIS — J449 Chronic obstructive pulmonary disease, unspecified: Secondary | ICD-10-CM | POA: Diagnosis not present

## 2024-01-25 DIAGNOSIS — I700 Atherosclerosis of aorta: Secondary | ICD-10-CM | POA: Diagnosis not present

## 2024-01-28 DIAGNOSIS — M159 Polyosteoarthritis, unspecified: Secondary | ICD-10-CM | POA: Diagnosis not present

## 2024-01-28 DIAGNOSIS — J449 Chronic obstructive pulmonary disease, unspecified: Secondary | ICD-10-CM | POA: Diagnosis not present

## 2024-01-28 DIAGNOSIS — J441 Chronic obstructive pulmonary disease with (acute) exacerbation: Secondary | ICD-10-CM | POA: Diagnosis not present

## 2024-01-28 DIAGNOSIS — E440 Moderate protein-calorie malnutrition: Secondary | ICD-10-CM | POA: Diagnosis not present

## 2024-01-28 DIAGNOSIS — E876 Hypokalemia: Secondary | ICD-10-CM | POA: Diagnosis not present

## 2024-01-28 DIAGNOSIS — E559 Vitamin D deficiency, unspecified: Secondary | ICD-10-CM | POA: Diagnosis not present

## 2024-01-28 DIAGNOSIS — Z681 Body mass index (BMI) 19 or less, adult: Secondary | ICD-10-CM | POA: Diagnosis not present

## 2024-01-28 DIAGNOSIS — B370 Candidal stomatitis: Secondary | ICD-10-CM | POA: Diagnosis not present

## 2024-01-28 DIAGNOSIS — E860 Dehydration: Secondary | ICD-10-CM | POA: Diagnosis not present

## 2024-02-03 ENCOUNTER — Encounter (INDEPENDENT_AMBULATORY_CARE_PROVIDER_SITE_OTHER): Payer: Self-pay

## 2024-02-19 ENCOUNTER — Encounter (INDEPENDENT_AMBULATORY_CARE_PROVIDER_SITE_OTHER): Payer: Self-pay | Admitting: Otolaryngology

## 2024-02-21 NOTE — Progress Notes (Unsigned)
 GI Office Note    Referring Provider: Bertell Satterfield, MD Primary Care Physician:  Bertell Satterfield, MD Primary Gastroenterologist: Muhammad Faizan Ahmed, MD  Date:  02/22/2024  ID:  Brandi, Bean 03/19/1951, MRN [id number]  Chief Complaint   Chief Complaint  Patient presents with   Follow-up    Pt arrives for follow up. Pt was in ER 01/20/24-01/22/24. Pt also due for TCS/EGD. Last TCS 10 years ago.    History of Present Illness  Brandi Bean is a 73 y.o. female with a history of COPD, HTN, HLD, stroke/TIA, fibromyalgia, anxiety/depression, arthritis, OSA, and tobacco abuse presenting today for hospital follow up.    Colonoscopy July 2015: - 6 mm tubular adenoma without dysplasia in sigmoid colon - Redundant sigmoid colon - Small internal hemorrhoids - Advised repeat colonoscopy in 5-10 years  CT for lung screening in January 2025: - Medial left lower lobe bronchial wall thickening and ill-defined nodularity, right upper lobe nodule interest was similar in size of 6.1 mm and other solid pulmonary nodules are unchanged with largest measuring 4.8 mm - Mild left upper lobe and lingual mucoid impaction unchanged - Left adrenal nodule measuring 1.4 cm consistent with adenoma  Seen by inpatient GI on  01/21/24 for oral pain/swelling and thrush associated with poor intake, weight loss, and early satiety. Signs of dehydration noted (hgb 15.6, K 2.6, creatinine 1.05). reported ongoing tobacco use. Reported throat burning white spots with redness on her tongue. Having issues with her lips as well. Taking daily stool softener on daily basis and not straining. Occasional heartburn. Was not able to tolerate nystatin swish and swallow Family reported 70lb weight loss however our records indicate about an 18lb weight loss since March 2022. Was having improvement with magic mouthwash. Was given 1 week fluconazole  oral for treatment.    Today:  PCP gave her more fluconazole  and gave her enough  to complete a month. They gave her more magic mouthwash as well. Dr. Fusco gave her nystatin as well.   She has had some dental work as well and had some teeth pulled - she had that done this morning. If it wasn't for her teeth issue she could eat better. Has been able to eat anything that she wants. Loves tomato sandwiches and she has been eating lots of those. Denies abdominal pain.  Recently had been constipated but took ex lax and stool softener. She has bene taking colace nightly. She did not have to use the enema. She took some miralax  as well in her coffee in the mornings.   Has quit smoking since she left the hospital.   Daughter states her appetite still is not what it was previously.   Her breathing is okay.   Wt Readings from Last 14 Encounters:  02/22/24 85 lb 9.6 oz (38.8 kg)  01/20/24 82 lb 0.2 oz (37.2 kg)  11/17/23 85 lb 6.4 oz (38.7 kg)  08/12/23 91 lb 14.4 oz (41.7 kg)  02/12/23 92 lb 12.8 oz (42.1 kg)  02/05/22 97 lb 9.6 oz (44.3 kg)  07/10/21 95 lb 12.8 oz (43.5 kg)  07/03/21 98 lb (44.5 kg)  04/25/21 96 lb 12.8 oz (43.9 kg)  11/26/20 99 lb 3.2 oz (45 kg)  10/01/20 100 lb (45.4 kg)  07/17/20 100 lb 6 oz (45.5 kg)  06/19/20 99 lb 8 oz (45.1 kg)  09/14/19 106 lb 3.2 oz (48.2 kg)    Current Outpatient Medications  Medication Sig Dispense Refill   albuterol  (PROVENTIL   HFA;VENTOLIN  HFA) 108 (90 BASE) MCG/ACT inhaler Inhale 2 puffs into the lungs every 6 (six) hours as needed for wheezing or shortness of breath.     atorvastatin  (LIPITOR) 20 MG tablet Take 20 mg by mouth daily at 6 PM.     bisoprolol  (ZEBETA ) 10 MG tablet SMARTSIG:1 Tablet(s) By Mouth     docusate sodium (COLACE) 50 MG capsule Take 50 mg by mouth daily as needed for mild constipation.     furosemide (LASIX) 20 MG tablet Take 20-40 mg by mouth daily as needed for fluid or edema.     mupirocin ointment (BACTROBAN) 2 % Apply 1 Application topically 2 (two) times daily.     oxyCODONE  (ROXICODONE ) 15 MG  immediate release tablet Take 15 mg by mouth every 4 (four) hours as needed for pain.     STIOLTO RESPIMAT 2.5-2.5 MCG/ACT AERS SMARTSIG:2 Puff(s) Via Inhaler Daily     nystatin (MYCOSTATIN) 100000 UNIT/ML suspension Take 5 mLs by mouth 4 (four) times daily. (Patient not taking: Reported on 02/22/2024)     ondansetron  (ZOFRAN ) 4 MG tablet Take 4 mg by mouth 4 (four) times daily as needed for nausea or vomiting. (Patient not taking: Reported on 02/22/2024)     No current facility-administered medications for this visit.    Past Medical History:  Diagnosis Date   Anxiety    Arthritis    Back pain    COPD (chronic obstructive pulmonary disease) (HCC)    Depression    Dyspnea    W/ EXERTION+   WHEEZING   Fibromyalgia    Headache    Hypertension    PONV (postoperative nausea and vomiting)    Sleep apnea    ?   MILD NO MACHINE ORDERED, TO BE RETESTED AT HOME AFTER SURGERY   Stroke [facility])    MINI STROKE   15-20 YRS AGO   UTI (urinary tract infection)     Past Surgical History:  Procedure Laterality Date   ABDOMINAL HYSTERECTOMY     BLADDER TACK   ANTERIOR CERVICAL DECOMP/DISCECTOMY FUSION N/A 08/05/2016   Procedure: ANTERIOR CERVICAL DECOMPRESSION/DISCECTOMY FUSION  - CERVICAL THREE-FOUR, CERRVICAL FOUR-FIVE, CERVICAL FIVE-SIX;  Surgeon: Victory Gunnels, MD;  [facility];  Service: Neurosurgery;  Laterality: N/A;   CATARACT EXTRACTION W/PHACO Right 02/24/2017   Procedure: CATARACT EXTRACTION PHACO AND INTRAOCULAR LENS PLACEMENT (IOC);  Surgeon: Perley Hamilton, MD;  Location: [facility] ORS;  Service: Ophthalmology;  Laterality: Right;  CDE: 9.96   CATARACT EXTRACTION W/PHACO Left 03/09/2017   Procedure: CATARACT EXTRACTION PHACO AND INTRAOCULAR LENS PLACEMENT LEFT EYE;  Surgeon: Perley Hamilton, MD;  Location: [facility] ORS;  Service: Ophthalmology;  Laterality: Left;  CDE: 7.10   CHOLECYSTECTOMY     COLONOSCOPY N/A 02/14/2014   Procedure: COLONOSCOPY;  Surgeon: Margo LITTIE Haddock, MD;  Location: [facility];   Service: Endoscopy;  Laterality: N/A;  10:00 AM-moved to 1215 An[address]ron to notify pt   HAND SURGERY     RIGHT   POLYPECTOMY  02/14/2014   Procedure: POLYPECTOMY;  Surgeon: Margo LITTIE Haddock, MD;  Location: [facility];  Service: Endoscopy;;  Sigmoid colon    History reviewed. No pertinent family history.  Allergies as of 02/22/2024 - Review Complete 02/22/2024  Allergen Reaction Noted   Doxycycline  Other (See Comments) 11/27/2020   Hydrocodone  Other (See Comments) 07/30/2016   Prednisone  Other (See Comments) 06/25/2007   Codeine sulfate Itching 06/25/2007   Latex Itching 02/16/2017    Social History   Socioeconomic History  Marital status: Married    Spouse name: Not on file   Number of children: Not on file   Years of education: Not on file   Highest education level: Not on file  Occupational History   Not on file  Tobacco Use   Smoking status: Every Day    Current packs/day: 1.00    Average packs/day: 1 pack/day for 33.6 years (33.6 ttl pk-yrs)    Types: Cigarettes    Start date: 5    Passive exposure: Current   Smokeless tobacco: Never   Tobacco comments:    A little over a half pack a day- 02/05/22 [facility]  Vaping Use   Vaping status: Former  Substance and Sexual Activity   Alcohol use: No    Alcohol/week: 0.0 standard drinks of alcohol   Drug use: No   Sexual activity: Not Currently  Other Topics Concern   Not on file  Social History Narrative   Not on file   Social Drivers of Health   Financial Resource Strain: Not on file  Food Insecurity: No Food Insecurity (01/20/2024)   Hunger Vital Sign    Worried About Running Out of Food in the Last Year: Never true    Ran Out of Food in the Last Year: Never true  Transportation Needs: No Transportation Needs (01/20/2024)   PRAPARE - Administrator, Civil Service (Medical): No    Lack of Transportation (Non-Medical): No  Physical Activity: Not on file  Stress: Not on file  Social Connections:  Moderately Isolated (01/20/2024)   Social Connection and Isolation Panel    Frequency of Communication with Friends and Family: More than three times a week    Frequency of Social Gatherings with Friends and Family: More than three times a week    Attends Religious Services: Never    Database administrator or Organizations: No    Attends Banker Meetings: Never    Marital Status: Married     Review of Systems   Gen: Denies fever, chills, anorexia. Denies fatigue, weakness, weight loss.  CV: Denies chest pain, palpitations, syncope, peripheral edema, and claudication. Resp: Denies dyspnea at rest, cough, wheezing, coughing up blood, and pleurisy. GI: See HPI Derm: Denies rash, itching, dry skin Psych: Denies depression, anxiety, memory loss, confusion. No homicidal or suicidal ideation.  Heme: Denies bruising, bleeding, and enlarged lymph nodes.  Physical Exam   BP 110/66   Pulse 60   Temp 97.7 F (36.5 C)   Ht 5' 5 (1.651 m)   Wt 85 lb 9.6 oz (38.8 kg)   BMI 14.24 kg/m   General:   Alert and oriented. Pleasant and cooperative. Thin appearing. Chronically ill appearing. Head:  Normocephalic and atraumatic. Eyes:  Conjuctiva clear without scleral icterus. Mouth:  Oral mucosa pink and moist. Poor dentition.  No lesions. Lungs:  Clear to auscultation bilaterally. Mild inspiratory wheezing anteriorly. No rales, or rhonchi. No distress.  Heart:  S1, S2 present without murmurs appreciated.  Abdomen:  +BS, soft, non-tender and non-distended. No rebound or guarding. No HSM or masses noted. Rectal: deferred Msk:  Symmetrical without gross deformities. Normal posture. Extremities:  Without edema. Neurologic:  Alert and  oriented x4 Psych:  Alert and cooperative. Normal mood and affect.  Assessment  Brandi Bean is a 73 y.o. female presenting today for hospital follow up of odynophagia.   Odynophagia, oral candidiasis: - Given her COPD and lung nodules she had been  trying new inhalers recently  and not doing any mouth rinsing.  Most likely because of oral candidiasis. - Had been experiencing odynophagia which likely was secondary to Candida which could have been extending into her esophagus.  Had significant throat burning as well as  cheilitis - During hospitalization she was treated with Magic mouthwash and started on fluconazole  and was noted to have some improvement.  Encouraged use of Vaseline and lip balm for lip hygiene. - Given no prior overt dysphagia, EGD was deferred. - Currently no odynophagia or dysphagia.   Weight loss, poor p.o. intake:  - Objectively has had a 18 pound weight loss since March 2022. - Recent acute drop in weight likely secondary to poor p.o. intake secondary to oropharyngeal candidiasis. - CT lung cancer screening January 2025 with medial left lobe bronchial wall thickening and ill-defined nodularity in the right upper lobe along with other solid pulmonary nodule unchanged from previous imaging.  Left adrenal nodule 1.4 cm consistent with adenoma. - No history of prior EGD.  Last colonoscopy in 2015 as noted below. - No recent abdominal imaging on file.   History of colon polyps: - Colonoscopy in 2015 with removal of tubular adenoma.  Currently overdue for surveillance. - In light of reported weight loss would benefit from updating colonoscopy given this as well.   PLAN   Proceed with upper endoscopy and colonoscopy with propofol  by Dr. Cinderella in near future: the risks, benefits, and alternatives have been discussed with the patient in detail. The patient states understanding and desires to proceed. ASA 3 BMP pre-op High protein diet Continue miralax  daily as needed Continue colace nightly.  Ensure as needed.  Follow up 3 months.    Charmaine Melia, MSN, FNP-BC, AGACNP-BC [facility]

## 2024-02-22 ENCOUNTER — Encounter: Payer: Self-pay | Admitting: Gastroenterology

## 2024-02-22 ENCOUNTER — Ambulatory Visit (INDEPENDENT_AMBULATORY_CARE_PROVIDER_SITE_OTHER): Admitting: Gastroenterology

## 2024-02-22 VITALS — BP 110/66 | HR 60 | Temp 97.7°F | Ht 65.0 in | Wt 85.6 lb

## 2024-02-22 DIAGNOSIS — B370 Candidal stomatitis: Secondary | ICD-10-CM

## 2024-02-22 DIAGNOSIS — Z860101 Personal history of adenomatous and serrated colon polyps: Secondary | ICD-10-CM

## 2024-02-22 DIAGNOSIS — E43 Unspecified severe protein-calorie malnutrition: Secondary | ICD-10-CM

## 2024-02-22 DIAGNOSIS — R1310 Dysphagia, unspecified: Secondary | ICD-10-CM | POA: Diagnosis not present

## 2024-02-22 DIAGNOSIS — R634 Abnormal weight loss: Secondary | ICD-10-CM | POA: Diagnosis not present

## 2024-02-22 DIAGNOSIS — R918 Other nonspecific abnormal finding of lung field: Secondary | ICD-10-CM

## 2024-02-22 DIAGNOSIS — Z860100 Personal history of colon polyps, unspecified: Secondary | ICD-10-CM

## 2024-02-22 NOTE — Patient Instructions (Addendum)
 We will get you scheduled for upper endoscopy and colonoscopy in the near future Dr. Cinderella.  Continue to follow a high-protein diet as well you can tolerate.  Drink Ensure as needed to maintain weight.  For constipation you may continue taking your MiraLAX  every day in your coffee and a Colace tablet nightly, you can reduce this as needed if you are having too many looser stools or increase MiraLAX  if having worsening constipation.  Will plan to follow-up in 3 months, sooner if needed.  It was a pleasure to see you today. I want to create trusting relationships with patients. If you receive a survey regarding your visit,  I greatly appreciate you taking time to fill this out on paper or through your MyChart. I value your feedback.  Charmaine Melia, MSN, FNP-BC, AGACNP-BC [facility]

## 2024-02-23 ENCOUNTER — Telehealth: Payer: Self-pay | Admitting: *Deleted

## 2024-02-23 NOTE — Telephone Encounter (Signed)
 Attempted to call pt, unable to leave message due to mailbox not being set up  TCS/EGD w/Dr.Ahmed, asa 3,okay rm 1-2

## 2024-02-25 DIAGNOSIS — E43 Unspecified severe protein-calorie malnutrition: Secondary | ICD-10-CM | POA: Diagnosis not present

## 2024-02-25 DIAGNOSIS — I700 Atherosclerosis of aorta: Secondary | ICD-10-CM | POA: Diagnosis not present

## 2024-02-25 DIAGNOSIS — M159 Polyosteoarthritis, unspecified: Secondary | ICD-10-CM | POA: Diagnosis not present

## 2024-02-25 DIAGNOSIS — E270 Other adrenocortical overactivity: Secondary | ICD-10-CM | POA: Diagnosis not present

## 2024-03-01 ENCOUNTER — Encounter: Payer: Self-pay | Admitting: *Deleted

## 2024-03-01 NOTE — Telephone Encounter (Signed)
 Attempted to call pt, unable to leave vm due to mailbox not being set up. Letter mailed

## 2024-03-07 NOTE — Telephone Encounter (Signed)
 Pt left VM she was calling to schedule procedure.  Called pt, went straight to VM but not able to leave message.

## 2024-03-14 DIAGNOSIS — E782 Mixed hyperlipidemia: Secondary | ICD-10-CM | POA: Diagnosis not present

## 2024-03-14 DIAGNOSIS — M159 Polyosteoarthritis, unspecified: Secondary | ICD-10-CM | POA: Diagnosis not present

## 2024-03-14 DIAGNOSIS — G894 Chronic pain syndrome: Secondary | ICD-10-CM | POA: Diagnosis not present

## 2024-03-14 DIAGNOSIS — I10 Essential (primary) hypertension: Secondary | ICD-10-CM | POA: Diagnosis not present

## 2024-04-11 DIAGNOSIS — H01004 Unspecified blepharitis left upper eyelid: Secondary | ICD-10-CM | POA: Diagnosis not present

## 2024-04-11 DIAGNOSIS — Z961 Presence of intraocular lens: Secondary | ICD-10-CM | POA: Diagnosis not present

## 2024-04-11 DIAGNOSIS — H01005 Unspecified blepharitis left lower eyelid: Secondary | ICD-10-CM | POA: Diagnosis not present

## 2024-04-11 DIAGNOSIS — H01001 Unspecified blepharitis right upper eyelid: Secondary | ICD-10-CM | POA: Diagnosis not present

## 2024-04-11 DIAGNOSIS — H16142 Punctate keratitis, left eye: Secondary | ICD-10-CM | POA: Diagnosis not present

## 2024-04-11 DIAGNOSIS — H01002 Unspecified blepharitis right lower eyelid: Secondary | ICD-10-CM | POA: Diagnosis not present

## 2024-04-12 DIAGNOSIS — H01001 Unspecified blepharitis right upper eyelid: Secondary | ICD-10-CM | POA: Diagnosis not present

## 2024-04-12 DIAGNOSIS — Z961 Presence of intraocular lens: Secondary | ICD-10-CM | POA: Diagnosis not present

## 2024-04-12 DIAGNOSIS — H16223 Keratoconjunctivitis sicca, not specified as Sjogren's, bilateral: Secondary | ICD-10-CM | POA: Diagnosis not present

## 2024-04-12 DIAGNOSIS — H16142 Punctate keratitis, left eye: Secondary | ICD-10-CM | POA: Diagnosis not present

## 2024-04-12 DIAGNOSIS — H01002 Unspecified blepharitis right lower eyelid: Secondary | ICD-10-CM | POA: Diagnosis not present

## 2024-04-14 ENCOUNTER — Encounter: Payer: Self-pay | Admitting: Gastroenterology

## 2024-08-07 ENCOUNTER — Ambulatory Visit (HOSPITAL_COMMUNITY): Admission: RE | Admit: 2024-08-07 | Source: Ambulatory Visit
